# Patient Record
Sex: Female | Born: 1935
Health system: Southern US, Community
[De-identification: ages and names within clinical notes are randomized; demographics above are authoritative.]

## PROBLEM LIST (undated history)

## (undated) DIAGNOSIS — K760 Fatty (change of) liver, not elsewhere classified: Secondary | ICD-10-CM

## (undated) DIAGNOSIS — E785 Hyperlipidemia, unspecified: Secondary | ICD-10-CM

## (undated) DIAGNOSIS — I10 Essential (primary) hypertension: Secondary | ICD-10-CM

## (undated) DIAGNOSIS — K611 Rectal abscess: Secondary | ICD-10-CM

## (undated) DIAGNOSIS — E041 Nontoxic single thyroid nodule: Secondary | ICD-10-CM

## (undated) HISTORY — PX: ANAL FISSURE REPAIR: SHX2312

## (undated) HISTORY — DX: Rectal abscess: K61.1

## (undated) HISTORY — DX: Nontoxic single thyroid nodule: E04.1

## (undated) HISTORY — DX: Essential (primary) hypertension: I10

## (undated) HISTORY — PX: HEMORRHOID SURGERY: SHX153

## (undated) HISTORY — PX: TONSILLECTOMY AND ADENOIDECTOMY: SUR1326

## (undated) HISTORY — DX: Hyperlipidemia, unspecified: E78.5

## (undated) HISTORY — PX: CATARACT EXTRACTION, BILATERAL: SHX1313

## (undated) HISTORY — PX: ABDOMINAL HYSTERECTOMY: SHX81

## (undated) HISTORY — DX: Fatty (change of) liver, not elsewhere classified: K76.0

---

## 1998-03-25 ENCOUNTER — Other Ambulatory Visit: Admission: RE | Admit: 1998-03-25 | Discharge: 1998-03-25 | Payer: Self-pay | Admitting: Obstetrics and Gynecology

## 1999-06-13 ENCOUNTER — Encounter: Payer: Self-pay | Admitting: Obstetrics and Gynecology

## 1999-06-13 ENCOUNTER — Encounter: Admission: RE | Admit: 1999-06-13 | Discharge: 1999-06-13 | Payer: Self-pay | Admitting: Obstetrics and Gynecology

## 1999-06-26 ENCOUNTER — Other Ambulatory Visit: Admission: RE | Admit: 1999-06-26 | Discharge: 1999-06-26 | Payer: Self-pay | Admitting: Obstetrics and Gynecology

## 2000-06-21 ENCOUNTER — Encounter: Admission: RE | Admit: 2000-06-21 | Discharge: 2000-06-21 | Payer: Self-pay | Admitting: Obstetrics and Gynecology

## 2000-06-21 ENCOUNTER — Encounter: Payer: Self-pay | Admitting: Obstetrics and Gynecology

## 2000-07-11 ENCOUNTER — Other Ambulatory Visit: Admission: RE | Admit: 2000-07-11 | Discharge: 2000-07-11 | Payer: Self-pay | Admitting: Obstetrics and Gynecology

## 2001-03-17 DIAGNOSIS — Z9089 Acquired absence of other organs: Secondary | ICD-10-CM | POA: Insufficient documentation

## 2001-07-11 ENCOUNTER — Encounter: Payer: Self-pay | Admitting: Obstetrics and Gynecology

## 2001-07-11 ENCOUNTER — Encounter: Admission: RE | Admit: 2001-07-11 | Discharge: 2001-07-11 | Payer: Self-pay | Admitting: Obstetrics and Gynecology

## 2001-08-11 ENCOUNTER — Other Ambulatory Visit: Admission: RE | Admit: 2001-08-11 | Discharge: 2001-08-11 | Payer: Self-pay | Admitting: Obstetrics and Gynecology

## 2002-08-13 ENCOUNTER — Encounter: Payer: Self-pay | Admitting: Obstetrics and Gynecology

## 2002-08-13 ENCOUNTER — Encounter: Admission: RE | Admit: 2002-08-13 | Discharge: 2002-08-13 | Payer: Self-pay | Admitting: Obstetrics and Gynecology

## 2002-12-24 ENCOUNTER — Encounter: Admission: RE | Admit: 2002-12-24 | Discharge: 2002-12-24 | Payer: Self-pay | Admitting: Family Medicine

## 2002-12-24 ENCOUNTER — Encounter: Payer: Self-pay | Admitting: Family Medicine

## 2003-07-13 ENCOUNTER — Encounter: Admission: RE | Admit: 2003-07-13 | Discharge: 2003-10-11 | Payer: Self-pay | Admitting: Family Medicine

## 2003-09-14 ENCOUNTER — Encounter: Admission: RE | Admit: 2003-09-14 | Discharge: 2003-09-14 | Payer: Self-pay | Admitting: Obstetrics and Gynecology

## 2003-10-14 ENCOUNTER — Encounter: Admission: RE | Admit: 2003-10-14 | Discharge: 2004-01-12 | Payer: Self-pay | Admitting: Family Medicine

## 2004-03-08 ENCOUNTER — Ambulatory Visit: Payer: Self-pay | Admitting: Family Medicine

## 2004-03-17 ENCOUNTER — Ambulatory Visit: Payer: Self-pay | Admitting: Internal Medicine

## 2004-03-27 ENCOUNTER — Ambulatory Visit: Payer: Self-pay | Admitting: Gastroenterology

## 2004-04-11 ENCOUNTER — Ambulatory Visit: Payer: Self-pay | Admitting: Gastroenterology

## 2004-04-17 ENCOUNTER — Ambulatory Visit (HOSPITAL_COMMUNITY): Admission: RE | Admit: 2004-04-17 | Discharge: 2004-04-17 | Payer: Self-pay | Admitting: Gastroenterology

## 2004-04-17 ENCOUNTER — Ambulatory Visit: Payer: Self-pay | Admitting: Gastroenterology

## 2004-05-16 ENCOUNTER — Ambulatory Visit: Payer: Self-pay | Admitting: Gastroenterology

## 2004-05-17 ENCOUNTER — Ambulatory Visit: Payer: Self-pay | Admitting: Family Medicine

## 2004-06-26 ENCOUNTER — Ambulatory Visit: Payer: Self-pay | Admitting: Gastroenterology

## 2004-07-04 ENCOUNTER — Ambulatory Visit: Payer: Self-pay | Admitting: Family Medicine

## 2004-09-07 ENCOUNTER — Ambulatory Visit: Payer: Self-pay | Admitting: Family Medicine

## 2004-09-29 ENCOUNTER — Other Ambulatory Visit: Admission: RE | Admit: 2004-09-29 | Discharge: 2004-09-29 | Payer: Self-pay | Admitting: Obstetrics and Gynecology

## 2004-10-20 ENCOUNTER — Encounter (INDEPENDENT_AMBULATORY_CARE_PROVIDER_SITE_OTHER): Payer: Self-pay | Admitting: *Deleted

## 2004-10-21 ENCOUNTER — Inpatient Hospital Stay (HOSPITAL_COMMUNITY): Admission: RE | Admit: 2004-10-21 | Discharge: 2004-10-22 | Payer: Self-pay | Admitting: Obstetrics and Gynecology

## 2004-12-22 ENCOUNTER — Ambulatory Visit (HOSPITAL_COMMUNITY): Admission: RE | Admit: 2004-12-22 | Discharge: 2004-12-22 | Payer: Self-pay | Admitting: *Deleted

## 2004-12-22 ENCOUNTER — Encounter (INDEPENDENT_AMBULATORY_CARE_PROVIDER_SITE_OTHER): Payer: Self-pay | Admitting: *Deleted

## 2004-12-22 ENCOUNTER — Ambulatory Visit (HOSPITAL_BASED_OUTPATIENT_CLINIC_OR_DEPARTMENT_OTHER): Admission: RE | Admit: 2004-12-22 | Discharge: 2004-12-22 | Payer: Self-pay | Admitting: *Deleted

## 2004-12-29 ENCOUNTER — Observation Stay (HOSPITAL_COMMUNITY): Admission: RE | Admit: 2004-12-29 | Discharge: 2004-12-30 | Payer: Self-pay | Admitting: General Surgery

## 2005-02-02 ENCOUNTER — Ambulatory Visit: Payer: Self-pay | Admitting: Family Medicine

## 2005-02-16 ENCOUNTER — Ambulatory Visit: Payer: Self-pay | Admitting: Family Medicine

## 2005-05-22 ENCOUNTER — Ambulatory Visit: Payer: Self-pay | Admitting: Family Medicine

## 2005-05-24 ENCOUNTER — Ambulatory Visit: Payer: Self-pay | Admitting: Family Medicine

## 2005-06-07 ENCOUNTER — Ambulatory Visit: Payer: Self-pay | Admitting: Family Medicine

## 2005-10-25 ENCOUNTER — Ambulatory Visit: Payer: Self-pay | Admitting: Family Medicine

## 2005-11-26 ENCOUNTER — Encounter: Admission: RE | Admit: 2005-11-26 | Discharge: 2005-11-26 | Payer: Self-pay | Admitting: Gastroenterology

## 2005-11-27 ENCOUNTER — Ambulatory Visit: Payer: Self-pay | Admitting: Family Medicine

## 2005-12-13 ENCOUNTER — Ambulatory Visit: Payer: Self-pay | Admitting: Family Medicine

## 2005-12-27 ENCOUNTER — Encounter: Admission: RE | Admit: 2005-12-27 | Discharge: 2005-12-27 | Payer: Self-pay | Admitting: Gastroenterology

## 2006-01-15 ENCOUNTER — Ambulatory Visit: Payer: Self-pay | Admitting: Family Medicine

## 2006-01-22 ENCOUNTER — Ambulatory Visit: Payer: Self-pay | Admitting: Family Medicine

## 2006-04-23 ENCOUNTER — Ambulatory Visit: Payer: Self-pay | Admitting: Family Medicine

## 2006-05-16 ENCOUNTER — Encounter: Admission: RE | Admit: 2006-05-16 | Discharge: 2006-05-16 | Payer: Self-pay | Admitting: Family Medicine

## 2006-06-06 ENCOUNTER — Ambulatory Visit: Payer: Self-pay | Admitting: Family Medicine

## 2006-06-06 LAB — CONVERTED CEMR LAB
AST: 30 units/L (ref 0–37)
Albumin: 4.2 g/dL (ref 3.5–5.2)
Bilirubin, Direct: 0.2 mg/dL (ref 0.0–0.3)
Calcium: 9.6 mg/dL (ref 8.4–10.5)
Free T4: 0.7 ng/dL (ref 0.6–1.6)
GFR calc non Af Amer: 47 mL/min
Potassium: 3.7 meq/L (ref 3.5–5.1)
TSH: 2.01 microintl units/mL (ref 0.35–5.50)
Total Bilirubin: 0.8 mg/dL (ref 0.3–1.2)
Total Protein: 6.9 g/dL (ref 6.0–8.3)

## 2006-06-10 ENCOUNTER — Encounter: Payer: Self-pay | Admitting: Family Medicine

## 2006-07-05 ENCOUNTER — Ambulatory Visit: Payer: Self-pay | Admitting: Family Medicine

## 2006-09-19 ENCOUNTER — Ambulatory Visit: Payer: Self-pay | Admitting: Family Medicine

## 2006-09-19 LAB — CONVERTED CEMR LAB
ALT: 25 units/L (ref 0–40)
Albumin: 4.5 g/dL (ref 3.5–5.2)
Alkaline Phosphatase: 66 units/L (ref 39–117)
Bilirubin, Direct: 0.2 mg/dL (ref 0.0–0.3)
CO2: 29 meq/L (ref 19–32)
Calcium: 9.8 mg/dL (ref 8.4–10.5)
Chloride: 101 meq/L (ref 96–112)
GFR calc Af Amer: 71 mL/min
GFR calc non Af Amer: 58 mL/min
Glucose, Bld: 109 mg/dL — ABNORMAL HIGH (ref 70–99)
Hgb A1c MFr Bld: 6 % (ref 4.6–6.0)
Microalb Creat Ratio: 5.9 mg/g (ref 0.0–30.0)
Microalb, Ur: 0.2 mg/dL (ref 0.0–1.9)
Potassium: 3.6 meq/L (ref 3.5–5.1)
Sodium: 140 meq/L (ref 135–145)
Total Protein: 7.6 g/dL (ref 6.0–8.3)

## 2006-09-24 DIAGNOSIS — S90936A Unspecified superficial injury of unspecified lesser toe(s), initial encounter: Secondary | ICD-10-CM

## 2006-09-24 DIAGNOSIS — S90929A Unspecified superficial injury of unspecified foot, initial encounter: Secondary | ICD-10-CM | POA: Insufficient documentation

## 2006-09-24 DIAGNOSIS — Z9889 Other specified postprocedural states: Secondary | ICD-10-CM

## 2006-09-24 DIAGNOSIS — K612 Anorectal abscess: Secondary | ICD-10-CM | POA: Insufficient documentation

## 2006-09-24 DIAGNOSIS — R945 Abnormal results of liver function studies: Secondary | ICD-10-CM

## 2006-09-24 DIAGNOSIS — K7689 Other specified diseases of liver: Secondary | ICD-10-CM

## 2006-09-24 DIAGNOSIS — E118 Type 2 diabetes mellitus with unspecified complications: Secondary | ICD-10-CM | POA: Insufficient documentation

## 2006-09-24 DIAGNOSIS — I10 Essential (primary) hypertension: Secondary | ICD-10-CM | POA: Insufficient documentation

## 2006-11-01 ENCOUNTER — Ambulatory Visit: Payer: Self-pay | Admitting: Family Medicine

## 2006-11-01 DIAGNOSIS — Z8639 Personal history of other endocrine, nutritional and metabolic disease: Secondary | ICD-10-CM

## 2006-11-01 DIAGNOSIS — E785 Hyperlipidemia, unspecified: Secondary | ICD-10-CM | POA: Insufficient documentation

## 2006-11-01 DIAGNOSIS — Z862 Personal history of diseases of the blood and blood-forming organs and certain disorders involving the immune mechanism: Secondary | ICD-10-CM | POA: Insufficient documentation

## 2006-12-10 ENCOUNTER — Encounter: Admission: RE | Admit: 2006-12-10 | Discharge: 2006-12-10 | Payer: Self-pay | Admitting: Family Medicine

## 2006-12-11 ENCOUNTER — Telehealth (INDEPENDENT_AMBULATORY_CARE_PROVIDER_SITE_OTHER): Payer: Self-pay | Admitting: *Deleted

## 2007-04-21 ENCOUNTER — Ambulatory Visit: Payer: Self-pay | Admitting: Family Medicine

## 2007-05-12 ENCOUNTER — Encounter (INDEPENDENT_AMBULATORY_CARE_PROVIDER_SITE_OTHER): Payer: Self-pay | Admitting: *Deleted

## 2007-10-17 ENCOUNTER — Ambulatory Visit: Payer: Self-pay | Admitting: Family Medicine

## 2007-10-18 LAB — CONVERTED CEMR LAB
ALT: 21 units/L (ref 0–35)
Albumin: 4.1 g/dL (ref 3.5–5.2)
Alkaline Phosphatase: 61 units/L (ref 39–117)
BUN: 24 mg/dL — ABNORMAL HIGH (ref 6–23)
Bilirubin, Direct: 0.1 mg/dL (ref 0.0–0.3)
Creatinine, Ser: 1.1 mg/dL (ref 0.4–1.2)
GFR calc non Af Amer: 52 mL/min
Potassium: 3.8 meq/L (ref 3.5–5.1)
Sodium: 138 meq/L (ref 135–145)
Total Bilirubin: 1.1 mg/dL (ref 0.3–1.2)
Total Protein: 6.9 g/dL (ref 6.0–8.3)

## 2007-10-20 ENCOUNTER — Encounter (INDEPENDENT_AMBULATORY_CARE_PROVIDER_SITE_OTHER): Payer: Self-pay | Admitting: *Deleted

## 2007-10-21 ENCOUNTER — Ambulatory Visit: Payer: Self-pay | Admitting: Family Medicine

## 2008-02-03 ENCOUNTER — Ambulatory Visit: Payer: Self-pay | Admitting: Family Medicine

## 2008-02-16 ENCOUNTER — Encounter (INDEPENDENT_AMBULATORY_CARE_PROVIDER_SITE_OTHER): Payer: Self-pay | Admitting: *Deleted

## 2008-02-24 ENCOUNTER — Ambulatory Visit: Payer: Self-pay | Admitting: Family Medicine

## 2008-02-24 LAB — CONVERTED CEMR LAB: Hgb A1c MFr Bld: 5.8 % (ref 4.6–6.0)

## 2008-02-26 ENCOUNTER — Encounter (INDEPENDENT_AMBULATORY_CARE_PROVIDER_SITE_OTHER): Payer: Self-pay | Admitting: *Deleted

## 2008-05-04 ENCOUNTER — Telehealth (INDEPENDENT_AMBULATORY_CARE_PROVIDER_SITE_OTHER): Payer: Self-pay | Admitting: *Deleted

## 2008-09-09 ENCOUNTER — Ambulatory Visit: Payer: Self-pay | Admitting: Family Medicine

## 2008-09-10 ENCOUNTER — Encounter (INDEPENDENT_AMBULATORY_CARE_PROVIDER_SITE_OTHER): Payer: Self-pay | Admitting: *Deleted

## 2008-09-14 ENCOUNTER — Encounter: Payer: Self-pay | Admitting: Family Medicine

## 2008-09-27 ENCOUNTER — Encounter (INDEPENDENT_AMBULATORY_CARE_PROVIDER_SITE_OTHER): Payer: Self-pay | Admitting: *Deleted

## 2008-10-22 ENCOUNTER — Ambulatory Visit: Payer: Self-pay | Admitting: Family Medicine

## 2009-04-11 ENCOUNTER — Telehealth: Payer: Self-pay | Admitting: Family Medicine

## 2009-06-01 ENCOUNTER — Encounter: Payer: Self-pay | Admitting: Family Medicine

## 2009-06-10 ENCOUNTER — Ambulatory Visit: Payer: Self-pay | Admitting: Family Medicine

## 2009-06-13 ENCOUNTER — Telehealth (INDEPENDENT_AMBULATORY_CARE_PROVIDER_SITE_OTHER): Payer: Self-pay | Admitting: *Deleted

## 2009-06-14 ENCOUNTER — Encounter: Payer: Self-pay | Admitting: Family Medicine

## 2009-06-23 ENCOUNTER — Ambulatory Visit: Payer: Self-pay | Admitting: Family Medicine

## 2009-07-14 ENCOUNTER — Ambulatory Visit: Payer: Self-pay | Admitting: Family Medicine

## 2009-12-13 ENCOUNTER — Ambulatory Visit: Payer: Self-pay | Admitting: Family Medicine

## 2009-12-19 LAB — CONVERTED CEMR LAB
AST: 25 units/L (ref 0–37)
Alkaline Phosphatase: 61 units/L (ref 39–117)
Bilirubin, Direct: 0.1 mg/dL (ref 0.0–0.3)
Calcium: 9.4 mg/dL (ref 8.4–10.5)
Creatinine,U: 40 mg/dL
GFR calc non Af Amer: 45.04 mL/min (ref >60)
Hgb A1c MFr Bld: 5.9 % (ref 4.6–6.5)
LDL Cholesterol: 65 mg/dL (ref 0–99)
Microalb, Ur: 0.4 mg/dL (ref 0.0–1.9)
Potassium: 3.9 meq/L (ref 3.5–5.1)
Total CHOL/HDL Ratio: 2

## 2009-12-29 ENCOUNTER — Ambulatory Visit: Payer: Self-pay | Admitting: Family Medicine

## 2010-02-01 ENCOUNTER — Encounter: Payer: Self-pay | Admitting: Family Medicine

## 2010-02-01 ENCOUNTER — Encounter: Admission: RE | Admit: 2010-02-01 | Discharge: 2010-02-01 | Payer: Self-pay | Admitting: Gastroenterology

## 2010-02-21 ENCOUNTER — Telehealth: Payer: Self-pay | Admitting: Family Medicine

## 2010-02-28 ENCOUNTER — Encounter: Payer: Self-pay | Admitting: Family Medicine

## 2010-03-02 ENCOUNTER — Encounter: Payer: Self-pay | Admitting: Family Medicine

## 2010-03-06 ENCOUNTER — Encounter: Payer: Self-pay | Admitting: Family Medicine

## 2010-03-14 ENCOUNTER — Ambulatory Visit (HOSPITAL_COMMUNITY): Admission: RE | Admit: 2010-03-14 | Discharge: 2010-03-14 | Payer: Self-pay | Admitting: Gastroenterology

## 2010-04-17 ENCOUNTER — Encounter: Payer: Self-pay | Admitting: Family Medicine

## 2010-05-28 ENCOUNTER — Encounter: Payer: Self-pay | Admitting: Obstetrics and Gynecology

## 2010-06-02 LAB — CBC
HCT: 38.2 % (ref 36.0–46.0)
MCH: 24.9 pg — ABNORMAL LOW (ref 26.0–34.0)
Platelets: 242 10*3/uL (ref 150–400)
RBC: 4.94 MIL/uL (ref 3.87–5.11)
RDW: 14.7 % (ref 11.5–15.5)
WBC: 6 10*3/uL (ref 4.0–10.5)

## 2010-06-02 LAB — DIFFERENTIAL
Basophils Absolute: 0.1 10*3/uL (ref 0.0–0.1)
Basophils Relative: 1 % (ref 0–1)
Eosinophils Absolute: 0.1 10*3/uL (ref 0.0–0.7)
Monocytes Absolute: 0.4 10*3/uL (ref 0.1–1.0)

## 2010-06-02 LAB — COMPREHENSIVE METABOLIC PANEL
Albumin: 3.9 g/dL (ref 3.5–5.2)
BUN: 19 mg/dL (ref 6–23)
Calcium: 9.7 mg/dL (ref 8.4–10.5)
Chloride: 106 mEq/L (ref 96–112)
Glucose, Bld: 99 mg/dL (ref 70–99)
Potassium: 4.2 mEq/L (ref 3.5–5.1)
Total Protein: 7 g/dL (ref 6.0–8.3)

## 2010-06-04 LAB — CONVERTED CEMR LAB
ALT: 23 units/L (ref 0–35)
AST: 24 units/L (ref 0–37)
AST: 29 units/L (ref 0–37)
AST: 29 units/L (ref 0–37)
Albumin: 4.1 g/dL (ref 3.5–5.2)
Albumin: 4.1 g/dL (ref 3.5–5.2)
Albumin: 4.2 g/dL (ref 3.5–5.2)
Alkaline Phosphatase: 59 units/L (ref 39–117)
BUN: 21 mg/dL (ref 6–23)
BUN: 31 mg/dL — ABNORMAL HIGH (ref 6–23)
Basophils Relative: 0.1 % (ref 0.0–3.0)
Bilirubin, Direct: 0.2 mg/dL (ref 0.0–0.3)
Bilirubin, Direct: 0.2 mg/dL (ref 0.0–0.3)
CO2: 26 meq/L (ref 19–32)
Calcium: 9.3 mg/dL (ref 8.4–10.5)
Calcium: 9.4 mg/dL (ref 8.4–10.5)
Calcium: 9.9 mg/dL (ref 8.4–10.5)
Chloride: 101 meq/L (ref 96–112)
Creatinine, Ser: 1 mg/dL (ref 0.4–1.2)
Creatinine, Ser: 1.1 mg/dL (ref 0.4–1.2)
Creatinine,U: 26.8 mg/dL
Creatinine,U: 29.6 mg/dL
Eosinophils Relative: 3.5 % (ref 0.0–5.0)
GFR calc Af Amer: 70 mL/min
GFR calc non Af Amer: 51.9 mL/min (ref >60)
Glucose, Bld: 107 mg/dL — ABNORMAL HIGH (ref 70–99)
Glucose, Bld: 98 mg/dL (ref 70–99)
Hgb A1c MFr Bld: 5.9 % (ref 4.6–6.5)
Hgb A1c MFr Bld: 6 % (ref 4.6–6.5)
MCHC: 34.6 g/dL (ref 30.0–36.0)
Microalb Creat Ratio: 6.8 mg/g (ref 0.0–30.0)
Microalb, Ur: 0.2 mg/dL (ref 0.0–1.9)
Microalb, Ur: 0.5 mg/dL (ref 0.0–1.9)
Monocytes Absolute: 0.5 10*3/uL (ref 0.1–1.0)
Neutrophils Relative %: 47.4 % (ref 43.0–77.0)
Pap Smear: NORMAL
Pap Smear: NORMAL
Potassium: 3.7 meq/L (ref 3.5–5.1)
Potassium: 3.8 meq/L (ref 3.5–5.1)
RBC: 5.07 M/uL (ref 3.87–5.11)
TSH: 3.46 microintl units/mL (ref 0.35–5.50)
Total Bilirubin: 0.9 mg/dL (ref 0.3–1.2)
Total Bilirubin: 1.1 mg/dL (ref 0.3–1.2)
Total Protein: 6.8 g/dL (ref 6.0–8.3)
Total Protein: 7 g/dL (ref 6.0–8.3)
Total Protein: 7.2 g/dL (ref 6.0–8.3)
WBC: 5 10*3/uL (ref 4.5–10.5)

## 2010-06-06 NOTE — Procedures (Signed)
Summary: EGD/[facility]  EGD/[facility]   Imported By: Lanelle Bal 03/09/2010 11:26:08  _____________________________________________________________________  External Attachment:    Type:   Image     Comment:   External Document

## 2010-06-06 NOTE — Assessment & Plan Note (Signed)
Summary: DISCUSS LABS/[facility]   Vital Signs:  Patient profile:   76 year old female Weight:      151 pounds Temp:     97.8 degrees F oral Pulse rate:   74 / minute Pulse rhythm:   regular BP sitting:   126 / 84  (left arm) Cuff size:   regular  Vitals Entered By: Army Fossa CMA (June 23, 2009 9:37 AM) CC: Discuss labs abd BP meds   History of Present Illness:  Hyperlipidemia follow-up      This is a 76 year old woman who presents for Hyperlipidemia follow-up.  The patient denies muscle aches, GI upset, abdominal pain, flushing, itching, constipation, diarrhea, and fatigue.  The patient denies the following symptoms: chest pain/pressure, exercise intolerance, dypsnea, palpitations, syncope, and pedal edema.  Compliance with medications (by patient report) has been near 100%.  Dietary compliance has been good.  The patient reports exercising daily.  Adjunctive measures currently used by the patient include ASA and fish oil supplements.    Hypertension follow-up      The patient also presents for Hypertension follow-up.  The patient denies lightheadedness, urinary frequency, headaches, edema, impotence, rash, and fatigue.  The patient denies the following associated symptoms: chest pain, chest pressure, exercise intolerance, dyspnea, palpitations, syncope, leg edema, and pedal edema.  Compliance with medications (by patient report) has been near 100%.  The patient reports that dietary compliance has been good.  The patient reports exercising daily.  Adjunctive measures currently used by the patient include salt restriction.    Type 1 diabetes mellitus follow-up      The patient is also here for Type 2 diabetes mellitus follow-up.  The patient denies polyuria, polydipsia, blurred vision, self managed hypoglycemia, hypoglycemia requiring help, weight loss, weight gain, and numbness of extremities.  The patient denies the following symptoms: neuropathic pain, chest pain, vomiting,  orthostatic symptoms, poor wound healing, intermittent claudication, vision loss, and foot ulcer.  Since the last visit the patient reports good dietary compliance, compliance with medications, exercising regularly, and monitoring blood glucose.  The patient has been measuring capillary blood glucose before dinner.  Since the last visit, the patient reports having had eye care by an ophthalmologist and no foot care.    Preventive Screening-Counseling & Management  Alcohol-Tobacco     Alcohol drinks/day: 0     Smoking Status: never     Passive Smoke Exposure: no  Caffeine-Diet-Exercise     Caffeine use/day: 1     Does Patient Exercise: yes     Type of exercise: walking     Times/week: 5  Current Medications (verified): 1)  Maxzide 75-50 Mg Tabs (Triamterene-Hctz) .... Take 1 Tablet By Mouth Once A Day 2)  Simvastatin 40 Mg Tabs (Simvastatin) .Marland Kitchen.. 1 By Mouth Once Daily. Needs Office Visit and Labwork. 3)  Klor-Con M20 20 Meq Tbcr (Potassium Chloride Crys Cr) .... Take 1 Tablet By Mouth Three Times A Day 4)  Lisinopril 20 Mg Tabs (Lisinopril) .Marland Kitchen.. 1 By Mouth Once Daily 5)  Tenormin 50 Mg Tabs (Atenolol) .... Take One Tablet Daily 6)  Baby Aspirin 81 Mg  Chew (Aspirin) 7)  Freestyle Lancets   Misc (Lancets) .... Accu Checks Bid 8)  Freestyle Test   Strp (Glucose Blood) .... Accu Check Two Times A Day 9)  Valium 5 Mg  Tabs (Diazepam) .... 1/2 By Mouth Once Daily  Allergies: 1)  ! Sulfa  Past History:  Past medical, surgical, family and social histories (including  risk factors) reviewed for relevance to current acute and chronic problems.  Past Medical History: Reviewed history from 10/21/2007 and no changes required. Diabetes mellitus, type II Hypertension Hyperlipidemia Current Problems:  HYPERLIPIDEMIA (ICD-272.4) THYROID NODULE, HX OF (ICD-V12.2) HEMORRHOIDECTOMY, HX OF (ICD-V45.89) ABSCESS, RECTUM (ICD-566) FOOT INJURY (ICD-917.8) TONSILLECTOMY AND ADENOIDECTOMY, HX OF  (ICD-V45.79) FATTY LIVER DISEASE (ICD-571.8) LIVER FUNCTION TESTS, ABNORMAL (ICD-794.8) HYPERTENSION (ICD-401.9) DIABETES MELLITUS, TYPE II (ICD-250.00)  Past Surgical History: Reviewed history from 09/24/2006 and no changes required. Hysterectomy-VAGINAL  Family History: Reviewed history from 10/21/2007 and no changes required. Family History Diabetes 1st degree relative Family History Hypertension  Social History: Reviewed history from 10/21/2007 and no changes required. Retired--teacher Married Never Smoked Alcohol use-no Drug use-no Regular exercise-yes  Review of Systems      See HPI  Physical Exam  General:  Well-developed,well-nourished,in no acute distress; alert,appropriate and cooperative throughout examination Neck:  No deformities, masses, or tenderness noted. Lungs:  Normal respiratory effort, chest expands symmetrically. Lungs are clear to auscultation, no crackles or wheezes. Heart:  normal rate and no murmur.   Extremities:  No clubbing, cyanosis, edema, or deformity noted with normal full range of motion of all joints.   Skin:  Intact without suspicious lesions or rashes Psych:  Cognition and judgment appear intact. Alert and cooperative with normal attention span and concentration. No apparent delusions, illusions, hallucinations  Diabetes Management Exam:    Foot Exam (with socks and/or shoes not present):       Sensory-Pinprick/Light touch:          Left medial foot (L-4): normal          Left dorsal foot (L-5): normal          Left lateral foot (S-1): normal          Right medial foot (L-4): normal          Right dorsal foot (L-5): normal          Right lateral foot (S-1): normal       Sensory-Monofilament:          Left foot: normal          Right foot: normal       Inspection:          Left foot: normal          Right foot: normal       Nails:          Left foot: normal          Right foot: normal    Eye Exam:       Eye Exam done  elsewhere          Date: 05/18/2009          Results: normal          Done by: McFarland   Impression & Recommendations:  Problem # 1:  HYPERLIPIDEMIA (ICD-272.4)  Her updated medication list for this problem includes:    Simvastatin 40 Mg Tabs (Simvastatin) .Marland Kitchen... 1 by mouth once daily. needs office visit and labwork.  Labs Reviewed: SGOT: 24 (06/10/2009)   SGPT: 23 (06/10/2009)  Problem # 2:  HYPERTENSION (ICD-401.9)  The following medications were removed from the medication list:    Maxzide 75-50 Mg Tabs (Triamterene-hctz) Her updated medication list for this problem includes:    Maxzide 75-50 Mg Tabs (Triamterene-hctz) .Marland Kitchen... Take 1 tablet by mouth once a day    Lisinopril 20 Mg Tabs (Lisinopril) .Marland Kitchen... 1 by mouth once daily  Tenormin 50 Mg Tabs (Atenolol) .Marland Kitchen... Take one tablet daily  BP today: 126/84 Prior BP: 130/80 (10/22/2008)  Labs Reviewed: K+: 3.7 (09/09/2008) Creat: : 1.1 (09/09/2008)     Problem # 3:  DIABETES MELLITUS, TYPE II (ICD-250.00)  Her updated medication list for this problem includes:    Lisinopril 20 Mg Tabs (Lisinopril) .Marland Kitchen... 1 by mouth once daily    Baby Aspirin 81 Mg Chew (Aspirin)  Labs Reviewed: Creat: 1.1 (09/09/2008)     Last Eye Exam: normal (05/18/2009) Reviewed HgBA1c results: 6.0 (06/10/2009)  5.9 (09/09/2008)  Complete Medication List: 1)  Maxzide 75-50 Mg Tabs (Triamterene-hctz) .... Take 1 tablet by mouth once a day 2)  Simvastatin 40 Mg Tabs (Simvastatin) .Marland Kitchen.. 1 by mouth once daily. needs office visit and labwork. 3)  Klor-con M20 20 Meq Tbcr (Potassium chloride crys cr) .... Take 1 tablet by mouth three times a day 4)  Lisinopril 20 Mg Tabs (Lisinopril) .Marland Kitchen.. 1 by mouth once daily 5)  Tenormin 50 Mg Tabs (Atenolol) .... Take one tablet daily 6)  Baby Aspirin 81 Mg Chew (Aspirin) 7)  Freestyle Lancets Misc (Lancets) .... Accu checks bid 8)  Freestyle Test Strp (Glucose blood) .... Accu check two times a day 9)  Valium 5 Mg  Tabs (Diazepam) .... 1/2 by mouth once daily  Patient Instructions: 1)  rto 2 weeks nurse visit for bp check Prescriptions: TENORMIN 50 MG TABS (ATENOLOL) TAKE ONE TABLET DAILY  #90 x 3   Entered and Authorized by:   Loreen Freud DO   Signed by:   Loreen Freud DO on 06/23/2009   Method used:   Electronically to        [facility] [phone]* (retail)       [address]       [place], [place]  [address]       Ph: [phone]       Fax: [phone]   RxID:   [id number] KLOR-CON M20 20 MEQ TBCR (POTASSIUM CHLORIDE CRYS CR) Take 1 tablet by mouth three times a day  #270 x 3   Entered and Authorized by:   Loreen Freud DO   Signed by:   Loreen Freud DO on 06/23/2009   Method used:   Electronically to        [facility] [phone]* (retail)       [address]       [place], [place]  [address]       Ph: [phone]       Fax: [phone]   RxID:   [id number] MAXZIDE 75-50 MG TABS (TRIAMTERENE-HCTZ) Take 1 tablet by mouth once a day  #90 x 3   Entered and Authorized by:   Loreen Freud DO   Signed by:   Loreen Freud DO on 06/23/2009   Method used:   Electronically to        [facility] [phone]* (retail)       [address]       [place], [place]  [address]       Ph: [phone]       Fax: [phone]   RxID:   [id number] SIMVASTATIN 40 MG TABS (SIMVASTATIN) 1 by mouth once daily. NEEDS OFFICE VISIT AND LABWORK.  #90 x 3   Entered and Authorized by:   Loreen Freud DO  Signed by:   Loreen Freud DO on 06/23/2009   Method used:   Electronically to        [facility] [phone]* (retail)       [address]       [place], [place]  [address]       Ph: [phone]       Fax: [phone]   RxID:   [id number] LISINOPRIL 20 MG TABS (LISINOPRIL) 1 by mouth once daily  #30 x 1   Entered and Authorized by:   Loreen Freud DO   Signed by:   Loreen Freud DO on  06/23/2009   Method used:   Electronically to        [facility] [phone]* (retail)       [address]       [place], [place]  [address]       Ph: [phone]       Fax: [phone]   RxID:   [id number] VALIUM 5 MG  TABS (DIAZEPAM) 1/2 by mouth once daily  #30 Tablet x 2   Entered and Authorized by:   Loreen Freud DO   Signed by:   Loreen Freud DO on 06/23/2009   Method used:   Print then Give to Patient   RxID:   [id number]

## 2010-06-06 NOTE — Assessment & Plan Note (Signed)
Summary: bp check/[facility]   Vital Signs:  Patient profile:   76 year old female Weight:      152.38 pounds Pulse rate:   76 / minute Pulse rhythm:   regular BP sitting:   130 / 82  (left arm) Cuff size:   regular  Vitals Entered By: Army Fossa CMA (July 14, 2009 10:28 AM) CC: Pt here for BP check.    History of Present Illness: Pt here for bp check only.  No complaints.    Allergies: 1)  ! Sulfa  Physical Exam  General:  Well-developed,well-nourished,in no acute distress; alert,appropriate and cooperative throughout examination Lungs:  Normal respiratory effort, chest expands symmetrically. Lungs are clear to auscultation, no crackles or wheezes. Heart:  normal rate and no murmur.   Psych:  Oriented X3 and normally interactive.     Impression & Recommendations:  Problem # 1:  HYPERTENSION (ICD-401.9)  Her updated medication list for this problem includes:    Maxzide 75-50 Mg Tabs (Triamterene-hctz) .Marland Kitchen... Take 1 tablet by mouth once a day    Lisinopril 20 Mg Tabs (Lisinopril) .Marland Kitchen... 1 by mouth once daily    Tenormin 50 Mg Tabs (Atenolol) .Marland Kitchen... Take one tablet daily  BP today: 130/82 Prior BP: 126/84 (06/23/2009)  Labs Reviewed: K+: 3.7 (09/09/2008) Creat: : 1.1 (09/09/2008)     Complete Medication List: 1)  Maxzide 75-50 Mg Tabs (Triamterene-hctz) .... Take 1 tablet by mouth once a day 2)  Simvastatin 40 Mg Tabs (Simvastatin) .Marland Kitchen.. 1 by mouth once daily. needs office visit and labwork. 3)  Klor-con M20 20 Meq Tbcr (Potassium chloride crys cr) .... Take 1 tablet by mouth three times a day 4)  Lisinopril 20 Mg Tabs (Lisinopril) .Marland Kitchen.. 1 by mouth once daily 5)  Tenormin 50 Mg Tabs (Atenolol) .... Take one tablet daily 6)  Baby Aspirin 81 Mg Chew (Aspirin) 7)  Freestyle Lancets Misc (Lancets) .... Accu checks bid 8)  Freestyle Test Strp (Glucose blood) .... Accu check two times a day 9)  Valium 5 Mg Tabs (Diazepam) .... 1/2 by mouth once daily  Patient  Instructions: 1)  rto august or sooner prn

## 2010-06-06 NOTE — Procedures (Signed)
Summary: pH Study/[facility]  pH Study/[facility]   Imported By: Lanelle Bal 03/14/2010 11:13:33  _____________________________________________________________________  External Attachment:    Type:   Image     Comment:   External Document

## 2010-06-06 NOTE — Assessment & Plan Note (Signed)
Summary: cpx/kn   Vital Signs:  Patient profile:   76 year old female Height:      61 inches Weight:      152 pounds BMI:     28.82 Temp:     98.5 degrees F oral Pulse rate:   56 / minute Pulse rhythm:   regular BP sitting:   140 / 90  (right arm)  Vitals Entered By: Almeta Monas CMA Duncan Dull) (December 29, 2009 10:12 AM) CC: cpe  Does patient need assistance? Functional Status Self care, Cook/clean, Shopping, Social activities Ambulation Normal Comments pt can read and write and is able to do all adls  Vision Screening:      Vision Comments:  optho q 1y  + cataracts and reading glasses 40db HL: Left  Right  Audiometry Comment: hearing grossly normal    History of Present Illness: Pt here for cpe,  no pap-- she sees gyn in sept.  Mammo and bmd scheduled already as well.     Type 1 diabetes mellitus follow-up      This is a 76 year old woman who presents with Type 2 diabetes mellitus follow-up.  The patient denies polyuria, polydipsia, blurred vision, self managed hypoglycemia, hypoglycemia requiring help, weight loss, weight gain, and numbness of extremities.  The patient denies the following symptoms: neuropathic pain, chest pain, vomiting, orthostatic symptoms, poor wound healing, intermittent claudication, vision loss, and foot ulcer.  Since the last visit the patient reports good dietary compliance, compliance with medications, exercising regularly, and monitoring blood glucose.  The patient has been measuring capillary blood glucose before breakfast.  Since the last visit, the patient reports having had eye care by an ophthalmologist and no foot care.    Hyperlipidemia follow-up      The patient also presents for Hyperlipidemia follow-up.  The patient denies muscle aches, GI upset, abdominal pain, flushing, itching, constipation, diarrhea, and fatigue.  The patient denies the following symptoms: chest pain/pressure, exercise intolerance, dypsnea, palpitations, syncope, and  pedal edema.  Compliance with medications (by patient report) has been near 100%.  Dietary compliance has been good.  The patient reports exercising daily.  Adjunctive measures currently used by the patient include ASA.    Hypertension follow-up      The patient also presents for Hypertension follow-up.  The patient denies lightheadedness, urinary frequency, headaches, edema, impotence, rash, and fatigue.  The patient denies the following associated symptoms: chest pain, chest pressure, exercise intolerance, dyspnea, palpitations, syncope, leg edema, and pedal edema.  Compliance with medications (by patient report) has been near 100%.  The patient reports that dietary compliance has been good.  The patient reports exercising daily.  Adjunctive measures currently used by the patient include salt restriction.    Preventive Screening-Counseling & Management  Alcohol-Tobacco     Alcohol drinks/day: 0     Smoking Status: never     Passive Smoke Exposure: no  Caffeine-Diet-Exercise     Caffeine use/day: 1     Does Patient Exercise: yes     Type of exercise: walking     Times/week: 7  Hep-HIV-STD-Contraception     HIV Risk: no     Dental Visit-last 6 months yes     Dental Care Counseling: not indicated; dental care within six months     SBE monthly: yes     SBE Education/Counseling: not indicated; SBE done regularly  Safety-Violence-Falls     Seat Belt Use: 100     Firearms in the Home: no firearms  in the home     Smoke Detectors: yes     Violence in the Home: no risk noted     Sexual Abuse: no     Fall Risk: no     Fall Risk Counseling: not indicated; no significant falls noted  Current Medications (verified): 1)  Maxzide 75-50 Mg Tabs (Triamterene-Hctz) .... Take 1 Tablet By Mouth Once A Day 2)  Simvastatin 40 Mg Tabs (Simvastatin) .Marland Kitchen.. 1 By Mouth Once Daily. Needs Office Visit and Labwork. 3)  Klor-Con M20 20 Meq Tbcr (Potassium Chloride Crys Cr) .... Take 1 Tablet By Mouth Every  Day 4)  Lisinopril 20 Mg Tabs (Lisinopril) .Marland Kitchen.. 1 By Mouth Once Daily 5)  Tenormin 50 Mg Tabs (Atenolol) .... Take One Tablet Daily 6)  Baby Aspirin 81 Mg  Chew (Aspirin) 7)  Freestyle Lancets   Misc (Lancets) .... Accu Checks Bid 8)  Freestyle Test   Strp (Glucose Blood) .... Accu Check Two Times A Day 9)  Valium 5 Mg  Tabs (Diazepam) .... 1/2 By Mouth Once Daily  Allergies (verified): 1)  ! Sulfa  Past History:  Past Medical History: Last updated: 10/21/2007 Diabetes mellitus, type II Hypertension Hyperlipidemia Current Problems:  HYPERLIPIDEMIA (ICD-272.4) THYROID NODULE, HX OF (ICD-V12.2) HEMORRHOIDECTOMY, HX OF (ICD-V45.89) ABSCESS, RECTUM (ICD-566) FOOT INJURY (ICD-917.8) TONSILLECTOMY AND ADENOIDECTOMY, HX OF (ICD-V45.79) FATTY LIVER DISEASE (ICD-571.8) LIVER FUNCTION TESTS, ABNORMAL (ICD-794.8) HYPERTENSION (ICD-401.9) DIABETES MELLITUS, TYPE II (ICD-250.00)  Past Surgical History: Last updated: 09/24/2006 Hysterectomy-VAGINAL  Family History: Last updated: 12/29/2009 Family History Diabetes 1st degree relative Family History Hypertension brother---rhabdo secondary to statin  Social History: Last updated: 10/21/2007 Retired--teacher Married Never Smoked Alcohol use-no Drug use-no Regular exercise-yes  Risk Factors: Alcohol Use: 0 (12/29/2009) Caffeine Use: 1 (12/29/2009) Exercise: yes (12/29/2009)  Risk Factors: Smoking Status: never (12/29/2009) Passive Smoke Exposure: no (12/29/2009)  Family History: Reviewed history from 10/21/2007 and no changes required. Family History Diabetes 1st degree relative Family History Hypertension brother---rhabdo secondary to statin  Social History: Reviewed history from 10/21/2007 and no changes required. 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 Eyes:  Denies blurring, discharge, double vision, eye irritation, eye pain, halos, itching, light sensitivity, red eye, vision loss-1 eye, and vision loss-both eyes. ENT:  Denies decreased hearing, difficulty swallowing, ear discharge, earache, hoarseness, nasal congestion, nosebleeds, postnasal drainage, ringing in ears, sinus pressure, and sore throat. CV:  Denies bluish discoloration of lips or nails, chest pain or discomfort, difficulty breathing at night, difficulty breathing while lying down, fainting, fatigue, leg cramps with exertion, lightheadness, near fainting, palpitations, shortness of breath with exertion, swelling of feet, swelling of hands, and weight gain. Resp:  Denies chest discomfort, chest pain with inspiration, cough, coughing up blood, excessive snoring, hypersomnolence, morning headaches, pleuritic, shortness of breath, sputum productive, and wheezing. GI:  Denies abdominal pain, bloody stools, change in bowel habits, constipation, dark tarry stools, diarrhea, excessive appetite, gas, hemorrhoids, indigestion, loss of appetite, nausea, vomiting, vomiting blood, and yellowish skin color. GU:  Denies abnormal vaginal bleeding, decreased libido, discharge, dysuria, genital sores, hematuria, incontinence, nocturia, urinary frequency, and urinary hesitancy. MS:  Denies joint pain, joint redness, joint swelling, loss of strength, low back pain, mid back pain, muscle aches, muscle , cramps, muscle weakness, stiffness, and  thoracic pain. Derm:  Denies changes in color of skin, changes in nail beds, dryness, excessive perspiration, flushing, hair loss, insect bite(s), itching, lesion(s), poor wound healing, and rash. Neuro:  Denies brief paralysis, difficulty with concentration, disturbances in coordination, falling down, headaches, inability to speak, memory loss, numbness, poor balance, seizures, sensation  of room spinning, tingling, tremors, visual disturbances, and weakness. Psych:  Denies alternate hallucination ( auditory/visual), anxiety, depression, easily angered, easily tearful, irritability, mental problems, panic attacks, sense of great danger, suicidal thoughts/plans, thoughts of violence, unusual visions or sounds, and thoughts /plans of harming others. Endo:  Denies cold intolerance, excessive hunger, excessive thirst, excessive urination, heat intolerance, polyuria, and weight change. Heme:  Denies abnormal bruising, bleeding, enlarge lymph nodes, fevers, pallor, and skin discoloration. Allergy:  Denies hives or rash, itching eyes, persistent infections, seasonal allergies, and sneezing.  Physical Exam  General:  Well-developed,well-nourished,in no acute distress; alert,appropriate and cooperative throughout examination Head:  Normocephalic and atraumatic without obvious abnormalities. No apparent alopecia or balding. Eyes:  vision grossly intact, pupils equal, pupils round, pupils reactive to light, and no injection.   Ears:  External ear exam shows no significant lesions or deformities.  Otoscopic examination reveals clear canals, tympanic membranes are intact bilaterally without bulging, retraction, inflammation or discharge. Hearing is grossly normal bilaterally. Nose:  External nasal examination shows no deformity or inflammation. Nasal mucosa are pink and moist without lesions or exudates. Mouth:  Oral mucosa and oropharynx without lesions or exudates.  Teeth in good repair. Neck:  No deformities, masses, or tenderness noted. Chest Wall:  No deformities, masses, or tenderness noted. Breasts:  gyn Lungs:  Normal respiratory effort, chest expands symmetrically. Lungs are clear to auscultation, no crackles or wheezes. Heart:  normal rate and no murmur.   Abdomen:  Bowel sounds positive,abdomen soft and non-tender without masses, organomegaly or hernias noted. Rectal:  No external  abnormalities noted. Normal sphincter tone. No rectal masses or tenderness. Genitalia:  gyn Msk:  normal ROM, no joint tenderness, no joint swelling, no joint warmth, no redness over joints, no joint deformities, no joint instability, and no crepitation.   Pulses:  R posterior tibial normal, R dorsalis pedis normal, R carotid normal, L posterior tibial normal, L dorsalis pedis normal, and L carotid normal.   Extremities:  No clubbing, cyanosis, edema, or deformity noted with normal full range of motion of all joints.   Neurologic:  No cranial nerve deficits noted. Station and gait are normal. Plantar reflexes are down-going bilaterally. DTRs are symmetrical throughout. Sensory, motor and coordinative functions appear intact. Skin:  Intact without suspicious lesions or rashes Cervical Nodes:  No lymphadenopathy noted Axillary Nodes:  No palpable lymphadenopathy Psych:  Cognition and judgment appear intact. Alert and cooperative with normal attention span and concentration. No apparent delusions, illusions, hallucinations   Impression & Recommendations:  Problem # 1:  PREVENTIVE HEALTH CARE (ICD-V70.0) labs reviewed Orders: Medicare -1st Annual Wellness Visit [phone])  Problem # 2:  HYPERLIPIDEMIA (ICD-272.4)  Her updated medication list for this problem includes:    Simvastatin 40 Mg Tabs (Simvastatin) .Marland Kitchen... 1 by mouth once daily. needs office visit and labwork.  Orders: Venipuncture ([id number])  Labs Reviewed: SGOT: 25 (12/13/2009)   SGPT: 17 (12/13/2009)   HDL:60.00 (12/13/2009)  LDL:65 (12/13/2009)  Chol:141 (12/13/2009)  Trig:81.0 (12/13/2009)  Problem # 3:  THYROID NODULE, HX OF (ICD-V12.2)  Problem # 4:  HYPERTENSION (ICD-401.9)  Her updated medication list for this problem includes:    Maxzide 75-50 Mg Tabs (  Triamterene-hctz) .Marland Kitchen... Take 1 tablet by mouth once a day    Lisinopril 20 Mg Tabs (Lisinopril) .Marland Kitchen... 1 by mouth once daily    Tenormin 50 Mg Tabs (Atenolol) .Marland Kitchen... Take one  tablet daily  BP today: 140/90 Prior BP: 130/82 (07/14/2009)  Labs Reviewed: K+: 3.9 (12/13/2009) Creat: : 1.2 (12/13/2009)   Chol: 141 (12/13/2009)   HDL: 60.00 (12/13/2009)   LDL: 65 (12/13/2009)   TG: 81.0 (12/13/2009)  Problem # 5:  DIABETES MELLITUS, TYPE II (ICD-250.00)  Her updated medication list for this problem includes:    Lisinopril 20 Mg Tabs (Lisinopril) .Marland Kitchen... 1 by mouth once daily    Baby Aspirin 81 Mg Chew (Aspirin)  Labs Reviewed: Creat: 1.2 (12/13/2009)     Last Eye Exam: normal (05/18/2009) Reviewed HgBA1c results: 5.9 (12/13/2009)  6.0 (06/10/2009)  Complete Medication List: 1)  Maxzide 75-50 Mg Tabs (Triamterene-hctz) .... Take 1 tablet by mouth once a day 2)  Simvastatin 40 Mg Tabs (Simvastatin) .Marland Kitchen.. 1 by mouth once daily. needs office visit and labwork. 3)  Klor-con M20 20 Meq Tbcr (Potassium chloride crys cr) .... Take 1 tablet by mouth every day 4)  Lisinopril 20 Mg Tabs (Lisinopril) .Marland Kitchen.. 1 by mouth once daily 5)  Tenormin 50 Mg Tabs (Atenolol) .... Take one tablet daily 6)  Baby Aspirin 81 Mg Chew (Aspirin) 7)  Freestyle Lancets Misc (Lancets) .... Accu checks bid 8)  Freestyle Test Strp (Glucose blood) .... Accu check two times a day 9)  Valium 5 Mg Tabs (Diazepam) .... 1/2 by mouth once daily  Patient Instructions: 1)  Take calcium +vitamin D daily. --  1200- 1500 mg calcium a day  with 1000u vita D3 Prescriptions: FREESTYLE TEST   STRP (GLUCOSE BLOOD) accu check two times a day  #1 month x 11   Entered and Authorized by:   Loreen Freud DO   Signed by:   Loreen Freud DO on 12/29/2009   Method used:   Electronically to        [id number]* (retail)       [address]       [place], [place]  [address]       Ph: [phone]       Fax: [phone]   RxID:   [id number] FREESTYLE LANCETS   MISC (LANCETS) Accu checks bid  #1 month x 11   Entered and Authorized by:   Loreen Freud DO   Signed  by:   Loreen Freud DO on 12/29/2009   Method used:   Electronically to        [facility] #[id number]* (retail)       [address]       [place], [place]  [address]       Ph: [phone]       Fax: [phone]   RxID:   [id number]   Herpes Zoster Next Due:  Refused      EKG  Procedure date:  12/29/2009  Findings:      Sinus bradycardia with rate of:  57

## 2010-06-06 NOTE — Progress Notes (Signed)
Summary: Refill Request  Phone Note Refill Request Call back at [phone] Message from:  Pharmacy on February 21, 2010 11:44 AM  Refills Requested: Medication #1:  LISINOPRIL 20 MG TABS 1 by mouth once daily   Dosage confirmed as above?Dosage Confirmed   Supply Requested: 1 month   Last Refilled: 08/24/2009  Medication #2:  VALIUM 5 MG  TABS 1/2 by mouth once daily.   Dosage confirmed as above?Dosage Confirmed   Brand Name Necessary? No   Supply Requested: 1 month   Last Refilled: 06/23/2009 [facility] [address]  Next Appointment Scheduled: 2.14.11 (lab) Initial call taken by: Harold Barban,  February 21, 2010 11:44 AM  Follow-up for Phone Call        Last OV 12/29/09 cpx done and last filled on 06/21/09...Marland KitchenMarland KitchenPlease advise on the Valium Follow-up by: Almeta Monas CMA Duncan Dull),  February 21, 2010 1:11 PM  Additional Follow-up for Phone Call Additional follow up Details #1::        ok to refill x1 Additional Follow-up by: Loreen Freud DO,  February 21, 2010 1:31 PM    Prescriptions: VALIUM 5 MG  TABS (DIAZEPAM) 1/2 by mouth once daily  #30 Tablet x 0   Entered by:   Almeta Monas CMA (AAMA)   Authorized by:   Loreen Freud DO   Signed by:   Almeta Monas CMA (AAMA) on 02/21/2010   Method used:   Printed then faxed to ...       [facility]  [address] [phone]* (retail)       [address]       [place], [place]  [address]       Ph: [phone]       Fax: [phone]   RxID:   [id number] LISINOPRIL 20 MG TABS (LISINOPRIL) 1 by mouth once daily  #30 Not Speci x 4   Entered by:   Almeta Monas CMA (AAMA)   Authorized by:   Loreen Freud DO   Signed by:   Almeta Monas CMA (AAMA) on 02/21/2010   Method used:   Electronically to        [facility] [phone]* (retail)       [address]       [place], [place]  [address]       Ph: [phone]       Fax: [phone]   RxID:   [id number]

## 2010-06-06 NOTE — Medication Information (Signed)
Summary: Formulary Letter Regarding Benicar/[employer]  Formulary Letter Regarding Benicar/[employer]   Imported By: Lanelle Bal 06/17/2009 09:51:14  _____________________________________________________________________  External Attachment:    Type:   Image     Comment:   External Document

## 2010-06-06 NOTE — Letter (Signed)
Summary: [facility]   Imported By: Lanelle Bal 03/14/2010 13:52:53  _____________________________________________________________________  External Attachment:    Type:   Image     Comment:   External Document

## 2010-06-06 NOTE — Progress Notes (Signed)
  Phone Note Outgoing Call   Summary of Call: Pt needs to bring formulary so we can see what medications they will cover. Benicar they do not cover. Left message with pts husband. Army Fossa CMA  June 13, 2009 5:00 PM      Appended Document:  Pt is aware.

## 2010-06-08 NOTE — Letter (Signed)
Summary: [facility]   Imported By: Lanelle Bal 05/02/2010 08:36:36  _____________________________________________________________________  External Attachment:    Type:   Image     Comment:   External Document

## 2010-06-09 ENCOUNTER — Other Ambulatory Visit: Payer: Self-pay | Admitting: General Surgery

## 2010-06-09 ENCOUNTER — Inpatient Hospital Stay (HOSPITAL_COMMUNITY)
Admission: RE | Admit: 2010-06-09 | Discharge: 2010-06-12 | DRG: 328 | Disposition: A | Discharge status: Home / Self Care | Payer: Medicare Other | Attending: General Surgery | Admitting: General Surgery

## 2010-06-09 DIAGNOSIS — E7800 Pure hypercholesterolemia, unspecified: Secondary | ICD-10-CM | POA: Diagnosis present

## 2010-06-09 DIAGNOSIS — E119 Type 2 diabetes mellitus without complications: Secondary | ICD-10-CM | POA: Diagnosis present

## 2010-06-09 DIAGNOSIS — K449 Diaphragmatic hernia without obstruction or gangrene: Principal | ICD-10-CM | POA: Diagnosis present

## 2010-06-09 DIAGNOSIS — I10 Essential (primary) hypertension: Secondary | ICD-10-CM | POA: Diagnosis present

## 2010-06-09 DIAGNOSIS — K219 Gastro-esophageal reflux disease without esophagitis: Secondary | ICD-10-CM | POA: Diagnosis present

## 2010-06-09 LAB — GLUCOSE, CAPILLARY
Glucose-Capillary: 156 mg/dL — ABNORMAL HIGH (ref 70–99)
Glucose-Capillary: 160 mg/dL — ABNORMAL HIGH (ref 70–99)
Glucose-Capillary: 149 mg/dL — ABNORMAL HIGH (ref 70–99)
Glucose-Capillary: 143 mg/dL — ABNORMAL HIGH (ref 70–99)

## 2010-06-09 LAB — TYPE AND SCREEN: ABO/RH(D): O POS

## 2010-06-10 ENCOUNTER — Ambulatory Visit (HOSPITAL_COMMUNITY): Payer: Medicare Other

## 2010-06-10 LAB — GLUCOSE, CAPILLARY
Glucose-Capillary: 134 mg/dL — ABNORMAL HIGH (ref 70–99)
Glucose-Capillary: 106 mg/dL — ABNORMAL HIGH (ref 70–99)

## 2010-06-10 MED ORDER — IOHEXOL 300 MG/ML  SOLN
50.0000 mL | Freq: Once | INTRAMUSCULAR | Status: AC | PRN
  Administered 2010-06-10: 50 mL via INTRAVENOUS

## 2010-06-11 LAB — GLUCOSE, CAPILLARY
Glucose-Capillary: 100 mg/dL — ABNORMAL HIGH (ref 70–99)
Glucose-Capillary: 110 mg/dL — ABNORMAL HIGH (ref 70–99)
Glucose-Capillary: 124 mg/dL — ABNORMAL HIGH (ref 70–99)

## 2010-06-12 LAB — GLUCOSE, CAPILLARY: Glucose-Capillary: 105 mg/dL — ABNORMAL HIGH (ref 70–99)

## 2010-06-12 NOTE — H&P (Signed)
  [name], [name]            ACCOUNT NO.:  [id number]  MEDICAL RECORD NO.:  [id number]           PATIENT TYPE:  O  LOCATION:  DAYL                         FACILITY:  [facility]  PHYSICIAN:  Adolph Pollack, M.D.DATE OF BIRTH:  [birth year]  DATE OF ADMISSION:  06/09/2010 DATE OF DISCHARGE:                             HISTORY & PHYSICAL   REASON FOR ADMISSION:  Hiatal hernia repair.  HISTORY:  [name] is a 76 year old female who had an acute episode of severe left upper quadrant pain and bloating.  She underwent an evaluation upper GI study demonstrating a paraesophageal hiatal hernia. She also has some gastroesophageal reflux disease.  Because of symptomatic hiatal hernia and gastroesophageal reflux, she now presents for repair.  PAST MEDICAL HISTORY: 1. Hiatal hernia. 2. Gastroesophageal reflux. 3. Hypertension. 4. Hypercholesterolemia. 5. Type 2 diabetes mellitus-diet controlled.  PAST SURGICAL HISTORY: 1. Fistulotomy for repair of fistula-in-ano. 2. Incision and drainage of anorectal abscess. 3. Stapled hemorrhoidectomy.  ALLERGIES:  SULFA.  MEDICATIONS: 1. Maxzide. 2. Tylenol. 3. Aleve. 4. Aspirin. 5. Valium. 6. Atenolol. 7. Lisinopril. 8. Klor-Con. 9. Simvastatin.  SOCIAL HISTORY:  She is married, retired Engineer, site.  No tobacco or alcohol use.  REVIEW OF SYSTEMS:  She had been feeling well lately.  No fever, chills.  PHYSICAL EXAMINATION:  GENERAL:  Mildly overweight female in no acute distress, very pleasant and cooperative. VITAL SIGNS:  Temperature is 96.9, pulse 81, blood pressure is 148/86, respiratory rate 20, and O2 saturations 96% on room air. RESPIRATORY:  Breath sounds equal and clear. CARDIOVASCULAR:  Regular rate, regular rhythm. ABDOMEN:  Soft, nontender.  No masses or organomegaly. EXTREMITIES:  SCD hose on.  IMPRESSION:  Large symptomatic hiatal hernia with gastroesophageal flux.  PLAN:  Laparoscopic hiatal hernia repair  with Nissen fundoplication and gastropexy.  Procedure risks and aftercare were discussed with her preoperatively.     Adolph Pollack, M.D.     Kari Baars  D:  06/09/2010  T:  06/09/2010  Job:  [id number]  Electronically Signed by Avel Peace M.D. on 06/12/2010 02:42:35 PM

## 2010-06-12 NOTE — Op Note (Signed)
[name], [name]            ACCOUNT NO.:  [id number]  MEDICAL RECORD NO.:  [id number]           PATIENT TYPE:  O  LOCATION:  DAYL                         FACILITY:  [facility]  PHYSICIAN:  Adolph Pollack, M.D.DATE OF BIRTH:  1935-03-14  DATE OF PROCEDURE:  06/09/2010 DATE OF DISCHARGE:                              OPERATIVE REPORT   PREOPERATIVE DIAGNOSIS:  Large hiatal hernia with gastroesophageal reflux.  POSTOPERATIVE DIAGNOSIS:  Large hiatal hernia with gastroesophageal reflux.  PROCEDURE: 1. Laparoscopic hiatal hernia repair. 2. Nissen fundoplication (oversized 56 dilator). 3. Gastropexy.  SURGEON:  Adolph Pollack, M.D.  ASSISTANT:  Karie Soda, M.D.  ANESTHESIA:  General.  INDICATIONS:  This is a 76 year old female who has a symptomatic large hiatal hernia with gastroesophageal reflux and she now presents for repair.  The procedure, risks and aftercare were discussed with her preoperatively.  TECHNIQUE:  She was brought to the operating room, placed supine on the operating table and a general anesthetic was administered.  A Foley catheter was inserted.  The abdominal wall was widely sterilely prepped and draped.  She was placed in the slight reverse Trendelenburg position.  In the left upper quadrant subcostal region laterally a small incision was made.  Using a 5-mm Opti-Vu trocar and laparoscope, I gained access into the peritoneal cavity and pneumoperitoneum was created.  Visualization below the trocar site demonstrated no evidence of organ injury or bleeding.  Following this, I then placed an 11-mm trocar superior and lateral to the umbilicus on the left side and the camera was put over into this trocar.  A 5-mm trocar was placed in the right upper quadrant. An 11-mm trocar was placed in the epigastric position.  Another 5-mm trocar was placed in the left upper quadrant.  I then made a 5-mm incision in the subxiphoid area and placed a liver  retractor through this incision and retracted the left lobe of the liver anteriorly, exposing a large hiatal hernia with at least two-thirds of the stomach up into the mediastinum.  The stomach was reduced.  Beginning on the right side, I divided the thin gastrohepatic ligament up to the level of the right crus.  I then began mobilizing the sac by retracting at the mediastinum beginning at the right crus and using blunt dissection the harmonic scalpel to mobilize the sac out of the mediastinum, going around to the anterior aspect of the crus and then carrying this over to the left crus.  This allowed me to reduce the stomach back into the abdominal cavity.  I then excised a large bit of the sac using the harmonic scalpel and removed it and sent it to Pathology.  Following this, I identified the esophagus.  There was some posterior sac which I mobilized free from the esophagus.  The vagus nerve was identified and kept with the esophagus posteriorly.  I then divided more of the sac using the harmonic scalpel from the left crus.  This allowed for circumferential mobilization of the esophageal junction and the stomach.  At this point, 3 to 4 cm of the esophagus was in the intra-abdominal position without tension.  Following this,  a Penrose drain was placed around the gastroesophageal junction and it was retracted anteriorly exposing the large hiatal hernia defect.  I then closed the defect with five pledgeted size 0 interrupted nonabsorbable interrupted sutures.  The closure was not under tension.  I then mobilized the fundus of the stomach by dividing the short gastric vessels from the mid fundus all the way up to the hiatus.  I brought the fundus through the retroesophageal window.  A size 56 bougie was then passed down the esophagus and into the stomach under laparoscopic vision.  I then performed a 360 degrees 2.5 cm fundoplication using size 0 nonabsorbable interrupted sutures.  The  proximal two sutures incorporated the left leaf of the wrap, a small bite of esophagus and the right leaf of the wrap.  The distal-most suture incorporated just the left and right leaves of the wrap.  I then removed the bougie, which was intact.  The wrap was floppy and under no tension.  The first two stitches were marked with hemoclips.  At this point in time, I then inspected the abdominal cavity.  There was no evidence of solid or hollow organ injury and no evidence of bleeding.  I then grasped the stomach at the body antral junction and anchored it to the anterior abdominal wall with two interrupted size 0 sutures for a gastropexy. The right side of the fundoplication was then anchored to the right crus with a single size 0 nonabsorbable suture.  A second inspection of the abdominal cavity demonstrated no evidence of bleeding, solid or hollow organ injury.  The liver retractor was then removed.  The trocars were removed and the pneumoperitoneum was released.  Skin incisions were then closed with 4-0 Monocryl subcuticular stitches. Steri-Strips and sterile dressings were applied.  She tolerated the procedure well without any apparent complications and was taken to the recovery room in satisfactory condition.     Adolph Pollack, M.D.     Kari Baars  D:  06/09/2010  T:  06/09/2010  Job:  [id number]  cc:   Adolph Pollack, M.D. [address], [address] [place] [address]  Anselmo Rod, MD, [facility] Fax: [phone]  Lelon Perla, DO [address] [place], [place] [address]  Electronically Signed by Avel Peace M.D. on 06/12/2010 02:42:23 PM

## 2010-06-20 ENCOUNTER — Other Ambulatory Visit: Payer: Self-pay

## 2010-07-03 ENCOUNTER — Encounter: Payer: Self-pay | Admitting: Family Medicine

## 2010-07-19 ENCOUNTER — Other Ambulatory Visit: Payer: Self-pay | Admitting: Family Medicine

## 2010-07-19 ENCOUNTER — Other Ambulatory Visit (INDEPENDENT_AMBULATORY_CARE_PROVIDER_SITE_OTHER): Payer: Medicare Other

## 2010-07-19 ENCOUNTER — Encounter (INDEPENDENT_AMBULATORY_CARE_PROVIDER_SITE_OTHER): Payer: Self-pay | Admitting: *Deleted

## 2010-07-19 DIAGNOSIS — E785 Hyperlipidemia, unspecified: Secondary | ICD-10-CM

## 2010-07-19 DIAGNOSIS — R7989 Other specified abnormal findings of blood chemistry: Secondary | ICD-10-CM

## 2010-07-19 LAB — LIPID PANEL
Cholesterol: 128 mg/dL (ref 0–200)
LDL Cholesterol: 55 mg/dL (ref 0–99)

## 2010-07-19 LAB — HEPATIC FUNCTION PANEL
ALT: 17 U/L (ref 0–35)
AST: 25 U/L (ref 0–37)
Albumin: 4.2 g/dL (ref 3.5–5.2)
Total Bilirubin: 1 mg/dL (ref 0.3–1.2)

## 2010-07-19 LAB — BASIC METABOLIC PANEL
BUN: 9 mg/dL (ref 6–23)
Chloride: 106 mEq/L (ref 96–112)
Potassium: 4.4 mEq/L (ref 3.5–5.1)

## 2010-07-19 LAB — HEMOGLOBIN A1C: Hgb A1c MFr Bld: 6 % (ref 4.6–6.5)

## 2010-07-24 NOTE — Discharge Summary (Signed)
  [name], [name]            ACCOUNT NO.:  [id number]  MEDICAL RECORD NO.:  [id number]           PATIENT TYPE:  I  LOCATION:  1540                         FACILITY:  [facility]  PHYSICIAN:  Adolph Pollack, M.D.DATE OF BIRTH:  06-16-1934  DATE OF ADMISSION:  06/09/2010 DATE OF DISCHARGE:  06/12/2010                              DISCHARGE SUMMARY   FINAL DISCHARGE DIAGNOSIS:  Large hiatal hernia.  SECONDARY DIAGNOSIS: 1. Gastroesophageal reflux. 2. Hypertension. 3. Hypercholesterolemia. 4. Type 2 diabetes mellitus.  PROCEDURE:  Laparoscopic hiatal hernia repair with Nissen fundoplication and gastropexy, June 09, 2010.  REASON FOR ADMISSION:  Ms. Row is a 76 year old female who has a symptomatic large hiatal hernia and also has gastroesophageal reflux disease, now presents for the above and was admitted for the above procedure.  HOSPITAL COURSE:  She underwent the above procedure which she tolerated well.  Her first postoperative day, water-soluble upper GI study showed no leak and she was started on some clear liquids.  She is on advanced to full liquids and tolerated this by her second postop day.  Blood sugars were under good control.  She is able to be discharged to home on her third postop day, June 12, 2010.  DISPOSITION:  Discharged home June 12, 2010.  She was given activity restrictions.  She will start on a level I diet which had been explained to her.  She will follow up with me in the office in 2 to 3 weeks.  She is to continue her home medications and was given a prescription for an analgesic as well.     Adolph Pollack, M.D.     Kari Baars  D:  07/15/2010  T:  07/15/2010  Job:  [id number]  cc:   Anselmo Rod, MD, [facility] Fax: [phone]  Lelon Perla, DO [address] [place], [place] [address]  Electronically Signed by Avel Peace M.D. on 07/24/2010 09:16:38 AM

## 2010-07-25 ENCOUNTER — Other Ambulatory Visit: Payer: Self-pay | Admitting: General Surgery

## 2010-07-25 NOTE — Letter (Signed)
Summary: [facility]   Imported By: Maryln Gottron 07/14/2010 14:36:51  _____________________________________________________________________  External Attachment:    Type:   Image     Comment:   External Document

## 2010-08-01 ENCOUNTER — Ambulatory Visit
Admission: RE | Admit: 2010-08-01 | Discharge: 2010-08-01 | Disposition: A | Discharge status: Home / Self Care | Payer: Medicare Other | Source: Ambulatory Visit | Attending: General Surgery | Admitting: General Surgery

## 2010-08-02 ENCOUNTER — Encounter: Payer: Self-pay | Admitting: Family Medicine

## 2010-08-03 ENCOUNTER — Ambulatory Visit (INDEPENDENT_AMBULATORY_CARE_PROVIDER_SITE_OTHER): Payer: Medicare Other | Admitting: Family Medicine

## 2010-08-03 ENCOUNTER — Encounter: Payer: Self-pay | Admitting: Family Medicine

## 2010-08-03 DIAGNOSIS — E119 Type 2 diabetes mellitus without complications: Secondary | ICD-10-CM

## 2010-08-03 DIAGNOSIS — I10 Essential (primary) hypertension: Secondary | ICD-10-CM

## 2010-08-03 DIAGNOSIS — F4323 Adjustment disorder with mixed anxiety and depressed mood: Secondary | ICD-10-CM | POA: Insufficient documentation

## 2010-08-03 DIAGNOSIS — E876 Hypokalemia: Secondary | ICD-10-CM

## 2010-08-03 DIAGNOSIS — E785 Hyperlipidemia, unspecified: Secondary | ICD-10-CM

## 2010-08-03 MED ORDER — TRIAMTERENE-HCTZ 75-50 MG PO TABS: 1.0000 | ORAL_TABLET | Freq: Every day | ORAL | Status: DC

## 2010-08-03 MED ORDER — ATENOLOL 50 MG PO TABS: 50.0000 mg | ORAL_TABLET | Freq: Every day | ORAL | Status: DC

## 2010-08-03 MED ORDER — SIMVASTATIN 40 MG PO TABS: 20.0000 mg | ORAL_TABLET | Freq: Every day | ORAL | Status: DC

## 2010-08-03 MED ORDER — POTASSIUM CHLORIDE 20 MEQ PO PACK: 20.0000 meq | PACK | Freq: Every day | ORAL | Status: DC

## 2010-08-03 MED ORDER — DIAZEPAM 5 MG PO TABS: 2.5000 mg | ORAL_TABLET | Freq: Every day | ORAL | Status: DC

## 2010-08-03 MED ORDER — LISINOPRIL 20 MG PO TABS: 20.0000 mg | ORAL_TABLET | Freq: Every day | ORAL | Status: DC

## 2010-08-03 NOTE — Progress Notes (Signed)
  Subjective:    Patient ID: [name], female    DOB: [birth year], 76 y.o.   MRN: [id number]  HPI HYPERTENSION Disease Monitoring Blood pressure range-? Chest pain- no      Dyspnea- no Medications Compliance- good Lightheadedness- no   Edema- no   DIABETES Disease Monitoring Blood Sugar ranges -not checking Polyuria- no New Visual problems- no-- optho due may Medications Compliance- no meds Hypoglycemic symptoms- n0   HYPERLIPIDEMIA Disease Monitoring See symptoms for Hypertension Medications Compliance- good RUQ pain- no  Muscle aches- no  ROS See HPI above   PMH Smoking Status noted    Review of Systems See HPI     Objective:   Physical Exam  Constitutional: She is oriented to person, place, and time. She appears well-developed and well-nourished. No distress.  Neck: Normal range of motion. Neck supple.  Cardiovascular: Normal rate and normal heart sounds.   No murmur heard. Pulmonary/Chest: Effort normal and breath sounds normal. No respiratory distress.  Musculoskeletal: She exhibits no edema and no tenderness.  Neurological: She is alert and oriented to person, place, and time.  Psychiatric: She has a normal mood and affect.          Assessment & Plan:

## 2010-08-03 NOTE — Assessment & Plan Note (Signed)
Uses diazepam rarely 

## 2010-08-03 NOTE — Assessment & Plan Note (Signed)
Lower dose zocor Labs reviewed Recheck 6 months

## 2010-08-03 NOTE — Assessment & Plan Note (Signed)
Diet controlled  check bld sugars  Check labs 6 months

## 2010-08-03 NOTE — Assessment & Plan Note (Signed)
con't K supplement

## 2010-08-03 NOTE — Assessment & Plan Note (Signed)
con't meds---she did not take it today Ov 6 months

## 2010-08-03 NOTE — Patient Instructions (Signed)
Hypertension (High Blood Pressure) As your heart beats, it forces blood through your arteries. This force is your blood pressure. If the pressure is too high, it is called hypertension (HTN) or high blood pressure. HTN is dangerous because you may have it and not know it. High blood pressure may mean that your heart has to work harder to pump blood. Your arteries may be narrow or stiff. The extra work puts you at risk for heart disease, stroke, and other problems.  Blood pressure consists of two numbers, a higher number over a lower, 110/72, for example. It is stated as "110 over 72." The ideal is below 120 for the top number (systolic) and under 80 for the bottom (diastolic). Write down your blood pressure today. You should pay close attention to your blood pressure if you have certain conditions such as:  Heart failure.  Prior heart attack.   Diabetes   Chronic kidney disease.   Prior stroke.   Multiple risk factors for heart disease.   To see if you have HTN, your blood pressure should be measured while you are seated with your arm held at the level of the heart. It should be measured at least twice. A one-time elevated blood pressure reading (especially in the Emergency Department) does not mean that you need treatment. There may be conditions in which the blood pressure is different between your right and left arms. It is important to see your caregiver soon for a recheck. Most people have essential hypertension which means that there is not a specific cause. This type of high blood pressure may be lowered by changing lifestyle factors such as:  Stress.  Smoking.   Lack of exercise.   Excessive weight.  Drug/tobacco/alcohol use.   Eating less salt.   Most people do not have symptoms from high blood pressure until it has caused damage to the body. Effective treatment can often prevent, delay or reduce that damage. TREATMENT Treatment for high blood pressure, when a cause has been  identified, is directed at the cause. There are a large number of medications to treat HTN. These fall into several categories, and your caregiver will help you select the medicines that are best for you. Medications may have side effects. You should review side effects with your caregiver. If your blood pressure stays high after you have made lifestyle changes or started on medicines,   Your medication(s) may need to be changed.   Other problems may need to be addressed.   Be certain you understand your prescriptions, and know how and when to take your medicine.   Be sure to follow up with your caregiver within the time frame advised (usually within two weeks) to have your blood pressure rechecked and to review your medications.   If you are taking more than one medicine to lower your blood pressure, make sure you know how and at what times they should be taken. Taking two medicines at the same time can result in blood pressure that is too low.  SEEK IMMEDIATE MEDICAL CARE IF YOU DEVELOP:  A severe headache, blurred or changing vision, or confusion.   Unusual weakness or numbness, or a faint feeling.   Severe chest or abdominal pain, vomiting, or breathing problems.  MAKE SURE YOU:   Understand these instructions.   Will watch your condition.   Will get help right away if you are not doing well or get worse.  Document Released: 04/23/2005 Document Re-Released: 10/11/2009 Exit[employer] Patient Information 2011 [employer].Diabetes Meal Planning Guide The diabetes meal planning guide is a tool to help you plan your meals and snacks. It is important for people with diabetes to manage their blood sugar levels. Choosing the right foods and the right amounts throughout your day will help control your blood sugar. Eating right can even help you improve your blood pressure and reach or maintain a healthy weight. CARBOHYDRATE COUNTING MADE EASY When you eat carbohydrates, they turn to sugar  (glucose). This raises your blood sugar level. Counting carbohydrates can help you control this level so you feel better. When you plan your meals by counting carbohydrates, you can have more flexibility in what you eat and balance your medicine with your food intake. Carbohydrate counting simply means adding up the total amount of carbohydrate grams (g) in your meals or snacks. Try to eat about the same amount at each meal. Foods with carbohydrates are listed below. Each portion below is 1 carbohydrate serving or 15 grams of carbohydrates. Ask your dietician how many grams of carbohydrates you should eat at each meal or snack. Grains and Starches 1 slice bread 1/2 English muffin or hotdog/hamburger bun 3/4 cup cold cereal (unsweetened) 1/3 cup cooked pasta or rice 1/2 cup starchy vegetables (corn, potatoes, peas, beans, winter squash) 1 tortilla (6 inches) 1/4 bagel 1 waffle or pancake (size of a CD) 1/2 cup cooked cereal 4 to 6 small crackers *Whole grain is recommended Fruit 1 cup fresh unsweetened berries, melon, papaya, pineapple 1 small fresh fruit 1/2 banana or mango 1/2 cup fruit juice (4 ounces unsweetened) 1/2 cup canned fruit in natural juice or water 2 tablespoons dried fruit 12 to 15 grapes or cherries Milk and Yogurt 1 cup fat-free or 1% milk 1 cup soy milk 6 ounces light yogurt with sugar-free sweetener 6 ounces low-fat soy yogurt 6 ounces plain yogurt Vegetables 1 cup raw or 1/2 cup cooked is counted as 0 carbohydrates or a "free" food. If you eat 3 or more servings at one meal, count them as 1 carbohydrate serving. Other Carbohydrates 3/4 ounces chips or pretzels 1/2 cup ice cream or frozen yogurt 1/4 cup sherbet or sorbet 2 inch square cake, no frosting 1 tablespoon honey, sugar, jam, jelly, or syrup 2 small cookies 3 squares of graham crackers 3 cups popcorn 6 crackers 1 cup broth-based soup Count 1 cup casserole or other mixed foods as 2 carbohydrate  servings. Foods with less than 20 calories in a serving may be counted as 0 carbohydrates or a "free" food. You may want to purchase a book or computer software that lists the carbohydrate gram counts of different foods. In addition, the nutrition facts panel on the labels of the foods you eat are a good source of this information. The label will tell you how big the serving size is and the total number of carbohydrate grams you will be eating per serving. Divide this number by 15 to obtain the number of carbohydrate servings in a portion. Remember: 1 carbohydrate serving equals 15 grams of carbohydrate. SERVING SIZES Measuring foods and serving sizes helps you make sure you are getting the right amount of food. The list below tells how big or small some common serving sizes are.  1 ounce (oz) of cheese.................................4 stacked dice.   2 to 3 oz cooked meat.................................Marland KitchenDeck of cards.   1 teaspoon (tsp)...........................................Marland KitchenTip of little finger.   1 tablespoon (tbs).......................................Marland KitchenMarland KitchenThumb.   2 tbs............................................................Marland KitchenGolf ball.    cup..........................................................Marland KitchenHalf of a fist.   1 cup...........................................................Marland KitchenA  fist.  SAMPLE DIABETES MEAL PLAN Below is a sample meal plan that includes foods from the grain and starches, dairy, vegetable, fruit, and meat groups. A dietician can individualize a meal plan to fit your calorie needs and tell you the number of servings needed from each food group. However, controlling the total amount of carbohydrates in your meal or snack is more important than making sure you include all of the food groups at every meal. You may interchange carbohydrate containing foods (dairy, starches, and fruits). The meal plan below is an example of a 2000 calorie diet using carbohydrate  counting. This meal plan has 17 carbohydrate servings (carb choices). Breakfast 1 cup oatmeal (2 carb choices) 3/4 cup light yogurt (1 carb choice) 1 cup blueberries (1 carb choice) 1/4 cup almonds  Snack 1 large apple (2 carb choices) 1 low-fat string cheese stick  Lunch Chicken breast salad:  1 cup spinach   1/4 cup chopped tomatoes   2 oz chicken breast, sliced   2 tbs low-fat [place] dressing  12 whole-wheat crackers (2 carb choices) 12 to 15 grapes (1 carb choice) 1 cup low-fat milk (1 carb choice)  Snack 1 cup carrots 1/2 cup hummus (1 carb choice)  Dinner 3 oz broiled salmon 1 cup brown rice (3 carb choices)  Snack 1 1/2 cups steamed broccoli (1 carb choice) drizzled with 1 tsp olive oil and lemon juice 1 cup light pudding (2 carb choices)  DIABETES MEAL PLANNING WORKSHEET Your dietician can use this worksheet to help you decide how many servings of foods and what types of foods are right for you.  Breakfast Food Group and Servings Carb Choices Grain/Starches _______________________________________ Dairy ______________________________________________ Vegetable _______________________________________ Fruit _______________________________________________ Meat _______________________________________________ Fat _____________________________________________ Lunch Food Group and Servings Carb Choices Grain/Starches ________________________________________ Dairy _______________________________________________ Fruit ________________________________________________ Meat ________________________________________________ Fat _____________________________________________ Dinner Food Group and Servings Carb Choices Grain/Starches ________________________________________ Dairy _______________________________________________ Fruit ________________________________________________ Meat ________________________________________________ Fat  _____________________________________________ Snacks Food Group and Servings Carb Choices Grain/Starches ________________________________________ Dairy _______________________________________________ Vegetable ________________________________________ Fruit ________________________________________________ Meat ________________________________________________ Fat _____________________________________________ Daily Totals Starches _________________________ Vegetable __________________________ Fruit ______________________________ Dairy ______________________________ Meat ______________________________ Fat ________________________________  Document Released: 01/18/2005 Document Re-Released: 10/11/2009 ExitCare Patient Information 2011 [place].

## 2010-09-20 ENCOUNTER — Encounter (INDEPENDENT_AMBULATORY_CARE_PROVIDER_SITE_OTHER): Payer: Self-pay | Admitting: General Surgery

## 2010-09-22 NOTE — Op Note (Signed)
NAMESKYELAR, HALLIDAY            ACCOUNT NO.:  [id number]   MEDICAL RECORD NO.:  [id number]          PATIENT TYPE:  AMB   LOCATION:  DAY                          [facility]AC[facility]  [facility]   PHYSICIAN:  Adolph Pollack, M.D.DATE OF BIRTH:  16-May-1934   DATE OF PROCEDURE:  12/29/2004  DATE OF DISCHARGE:                                 OPERATIVE REPORT   PREOPERATIVE DIAGNOSIS:  Anorectal abscess.   POSTOPERATIVE DIAGNOSIS:  Anorectal abscess, fistula-in-ano.   PROCEDURE:  1.  Exam under anesthesia.  2.  Incision and drainage of anorectal abscess.  3.  Fistulotomy.   SURGEON:  Adolph Pollack, M.D.   ANESTHESIA:  General.   INDICATIONS:  This 76 year old female underwent a PPH procedure and lateral  internal sphincterotomy by Dr. Lovie Chol 1 week ago. She had been  having fairly significant pain and apparently went to the office Wednesday  and was noted to have a left-sided anorectal abscess and a simple incision  and drainage performed. However, she continued to have significant pain  presented to the Urgent office today and saw Dr. Carolynne Edouard who was concerned not  only about the anorectal abscess but a fistula-in-ano. She was sent to  [facility] and is brought to the operating room. The procedure and the  risks were discussed with her and her husband preoperatively.   TECHNIQUE:  She is brought to the operating room, placed supine on the  operating table and a general anesthetic was administered. She is then  placed in the lithotomy position. The perianal area was sterilely prepped  and draped. Exam under anesthesia was performed. A digital rectal exam was  performed and I could feel the PPH staple line but also a defect in the left  lateral rectal wall. There was a small puncture wound present in the left  anorectal area. I probed this with the hemostat and noted pus/purulent  drainage coming out of the rectum. There was a fistula connection that was  easily demonstrated  in the subcutaneous tissue. I subsequently performed a  fistulotomy opening up the fistulous tract. I then debrided the wound of  some indurated tissue. Following this, I inspected the rest of anal area and  noticed a fissure at approximately the 7 o'clock position. Hemostasis were  controlled with electrocautery. The abscess cavity/fistula went up just  lateral to the staple line on the left side. I put a drain up into this  lateral recess and anchored it to the anal mucosa with 3-0 chromic suture.  The drain consisted of a 1/4 inch Penrose. Following this a bulky dressing  was applied. The perianal block was performed with Marcaine before the  dressing was applied.   She tolerated the procedure without any apparent complications and was taken  to the recovery room in satisfactory condition. She will be kept overnight  on intravenous antibiotics in the hospital for observation.      Adolph Pollack, M.D.  Electronically Signed     TJR/MEDQ  D:  12/29/2004  T:  12/29/2004  Job:  [id number]

## 2010-09-22 NOTE — Op Note (Signed)
NAMEALEXEE, [name]            ACCOUNT NO.:  [id number]   MEDICAL RECORD NO.:  [id number]          PATIENT TYPE:  AMB   LOCATION:  [facility]                         FACILITY:  [facility]   PHYSICIAN:  Vikki Ports, MDDATE OF BIRTH:  [birth year]   DATE OF PROCEDURE:  12/22/2004  DATE OF DISCHARGE:  12/22/2004                                 OPERATIVE REPORT   PREOPERATIVE DIAGNOSIS:  Anal stenosis, anal fissure, and internal  hemorrhoids.   PROCEDURE:  Procedure for prolapsing hemorrhoids hemorrhoidectomy, anal  dilatation, and lateral sphincterotomy.   ANESTHESIA:  General.   SURGEON:  Vikki Ports, MD   DESCRIPTION:  The patient was taken to the operating room and placed in a  supine position.  After adequate general anesthesia was induced using  endotracheal tube, the patient was placed in the prone jack-knife position.  Perianal and rectal prep were undertaken.  Anal dilatation was accomplished  but only to about two fingerbreadths, as the anal sphincter was very tight.  I opted to perform left lateral internal sphincterotomy.  A small incision  was made in the mucosa.  The sphincter muscle was then isolated using a  Hemostat and divided using Bovie electrocautery.  This wound was irrigated.   I then placed a 2-0 Prolene suture 5 cm proximal to the dentate line.  The  [facility] stapler was introduced, closed, held in place, and fired.  A good rim of  hemorrhoidal tissue was identified.  Staple line was inspected, and there  was no bleeding.  Gelfoam packing was placed.  The patient tolerated the  procedure well and went to the PACU in good condition.      Vikki Ports, MD  Electronically Signed     KRH/MEDQ  D:  01/23/2005  T:  01/23/2005  Job:  [id number]

## 2010-09-22 NOTE — Op Note (Signed)
[name], [name]            ACCOUNT NO.:  [id number]   MEDICAL RECORD NO.:  [id number]          PATIENT TYPE:  OBV   LOCATION:  9310                          FACILITY:  [facility]   PHYSICIAN:  Michelle L. Grewal, M.D.DATE OF BIRTH:  July 16, 1934   DATE OF PROCEDURE:  10/20/2004  DATE OF DISCHARGE:                                 OPERATIVE REPORT   PREOPERATIVE DIAGNOSIS:  Procidentia.   POSTOPERATIVE DIAGNOSIS:  Procidentia.   PROCEDURE:  Total vaginal hysterectomy and anterior and posterior repair.   SURGEON:  Dr. Vincente Poli   ASSISTANT:  Dr. Rosalia Hammers   ANESTHESIA:  General.   SPECIMENS:  Uterus and cervix.   DESCRIPTION OF PROCEDURE:  The patient was taken to the operating room.  She  was intubated without difficulty.  She was placed in a high lithotomy  position.  She was prepped and draped in the usual sterile fashion.  A Foley  catheter was inserted into the bladder.  When the total procidentia was  noted, we then first started with a circumferential incision around the  cervix and pushing the redundant vaginal epithelium away from the cervix, we  then used curved Heaney clamps to clamp one of the uterosacral ligament.  Each pedicle was cut and suture ligated using 0 Vicryl suture.  We entered  the posterior cul-de-sac sharply and then the anterior cul-de-sac sharply as  well and worked our way up along the broad ligament and along the cervix  using a curved Heaney clamp.  Each pedicle was cut and suture ligated using  0 Vicryl suture.  Once we had reached the level of the triple pedicle, the  uterus was retroflexed, and a curved Heaney clamp was placed across each  pedicle.  Each specimen was cut and suture ligated using 0 Vicryl suture and  further sutures using a tie of 0 Vicryl suture.  At this point, hemostasis  was noted.  The ovaries were inspected and noted to be very normal and very  atrophic and affixed to the pelvic sidewalls, so it was elected not to  remove them.  At  this point, we elected to proceed with repair of the grade  3 cystocele by grasping each side of the vaginal apex with Allis clamps and  making a midline incision all the way up to the UV junction with Metzenbaum  scissors, dissecting the vesicovaginal fascia free and reducing the  cystocele nicely.  We then closed the cystocele using a series of  pursestring sutures using 0 Vicryl suture x 4 with good effect.  The  redundant vaginal epithelium was trimmed and the vaginal epithelium  anteriorly was closed using continuous running locked __________ 0 Vicryl  suture.  At this point, we decided to proceed with the posterior repair.  Allis clamps were placed at 5 and 7 o'clock.  A V-shaped incision was made  in the perineum, and the rectovaginal fascia was dissected free of the  posterior wall of the vagina all the way up to the open vaginal cuff.  We  then reduced the rectocele and enterocele nicely using a series of  interrupteds of 0  Vicryl suture, trimmed the redundant vaginal epithelium  and then closed the cuff and the posterior vaginal wall using continuous  running locked stitches of 0 Vicryl suture.  The perineum was reinforced  with 0 Vicryl stitch, and the perineum was closed using 0 Vicryl stitch.  At  the end of the procedure, no bleeding was noted.  She had excellent support  anteriorly, posteriorly in the vaginal vault.  Vaginal  packing was placed in the vagina soaked in Estrace cream which will be  removed in the morning.  The Foley was draining clear urine.  The patient  was taken to the recovery room awake and in stable condition.  All sponge,  lap, and instrument counts were correct x 2.       MLG/MEDQ  D:  10/20/2004  T:  10/20/2004  Job:  [id number]

## 2010-09-22 NOTE — Assessment & Plan Note (Signed)
[facility]                                   ON-CALL NOTE   NAME:[name], Kathryn                   MRN:          [id number]  DATE:03/09/2006                            DOB:          1934/09/19    [name] is a patient of Dr. Laury Axon, [place].  She  was seen at emergent care thereafter, and was diagnosed with a fractured  nose.  She was advised that she needs to follow up with an ENT on Monday.  She was told to call the emergent care to schedule that appointment.  The  patient would prefer to go through [employer] and her primary care  [name] to get this scheduled.  The patient reports that she is not  bleeding from her nose, denies any headaches or blurry vision.   PLAN:  I advised the patient to call Dr. Ernst Spell office on Monday morning to  discuss what transpired over the weekend.  She can either be scheduled to  see ENT, or be reassessed by Dr. Laury Axon.  She can call the doctor on call  this weekend if she has any concerns.  The patient expressed understanding.    ______________________________  Leanne Chang, M.D.    LA/MedQ  DD: 03/09/2006  DT: 03/10/2006  Job #: [id number]

## 2011-01-22 ENCOUNTER — Other Ambulatory Visit: Payer: Self-pay | Admitting: Family Medicine

## 2011-01-22 DIAGNOSIS — E785 Hyperlipidemia, unspecified: Secondary | ICD-10-CM

## 2011-01-22 DIAGNOSIS — F4323 Adjustment disorder with mixed anxiety and depressed mood: Secondary | ICD-10-CM

## 2011-01-22 DIAGNOSIS — E876 Hypokalemia: Secondary | ICD-10-CM

## 2011-01-22 DIAGNOSIS — E119 Type 2 diabetes mellitus without complications: Secondary | ICD-10-CM

## 2011-01-22 DIAGNOSIS — I10 Essential (primary) hypertension: Secondary | ICD-10-CM

## 2011-01-23 ENCOUNTER — Other Ambulatory Visit (INDEPENDENT_AMBULATORY_CARE_PROVIDER_SITE_OTHER): Payer: Medicare Other

## 2011-01-23 DIAGNOSIS — E785 Hyperlipidemia, unspecified: Secondary | ICD-10-CM

## 2011-01-23 DIAGNOSIS — E119 Type 2 diabetes mellitus without complications: Secondary | ICD-10-CM

## 2011-01-23 LAB — BASIC METABOLIC PANEL
BUN: 27 mg/dL — ABNORMAL HIGH (ref 6–23)
Calcium: 9.7 mg/dL (ref 8.4–10.5)
Creatinine, Ser: 1 mg/dL (ref 0.4–1.2)
GFR: 57.56 mL/min — ABNORMAL LOW (ref >60.00)
Glucose, Bld: 84 mg/dL (ref 70–99)

## 2011-01-23 LAB — HEPATIC FUNCTION PANEL
ALT: 21 U/L (ref 0–35)
AST: 26 U/L (ref 0–37)
Bilirubin, Direct: 0 mg/dL (ref 0.0–0.3)
Total Bilirubin: 0.6 mg/dL (ref 0.3–1.2)

## 2011-01-23 LAB — LIPID PANEL: Total CHOL/HDL Ratio: 2

## 2011-02-09 ENCOUNTER — Ambulatory Visit (INDEPENDENT_AMBULATORY_CARE_PROVIDER_SITE_OTHER): Payer: Medicare Other | Admitting: Family Medicine

## 2011-02-09 ENCOUNTER — Encounter: Payer: Self-pay | Admitting: Family Medicine

## 2011-02-09 VITALS — BP 170/100 | HR 64 | Temp 97.9°F | Ht 62.0 in | Wt 130.8 lb

## 2011-02-09 DIAGNOSIS — Z0000 Encounter for general adult medical examination without abnormal findings: Secondary | ICD-10-CM

## 2011-02-09 DIAGNOSIS — E119 Type 2 diabetes mellitus without complications: Secondary | ICD-10-CM

## 2011-02-09 DIAGNOSIS — F419 Anxiety disorder, unspecified: Secondary | ICD-10-CM

## 2011-02-09 DIAGNOSIS — I10 Essential (primary) hypertension: Secondary | ICD-10-CM

## 2011-02-09 DIAGNOSIS — F4323 Adjustment disorder with mixed anxiety and depressed mood: Secondary | ICD-10-CM

## 2011-02-09 DIAGNOSIS — Z23 Encounter for immunization: Secondary | ICD-10-CM

## 2011-02-09 DIAGNOSIS — E876 Hypokalemia: Secondary | ICD-10-CM

## 2011-02-09 DIAGNOSIS — E785 Hyperlipidemia, unspecified: Secondary | ICD-10-CM

## 2011-02-09 MED ORDER — LISINOPRIL 40 MG PO TABS: 40.0000 mg | ORAL_TABLET | Freq: Every day | ORAL | Status: DC

## 2011-02-09 MED ORDER — SIMVASTATIN 40 MG PO TABS: 20.0000 mg | ORAL_TABLET | Freq: Every day | ORAL | Status: DC

## 2011-02-09 MED ORDER — ZOSTER VACCINE LIVE 19400 UNT/0.65ML ~~LOC~~ SOLR: 0.6500 mL | Freq: Once | SUBCUTANEOUS | Status: DC

## 2011-02-09 MED ORDER — TRIAMTERENE-HCTZ 75-50 MG PO TABS: 0.5000 | ORAL_TABLET | Freq: Every day | ORAL | Status: DC

## 2011-02-09 MED ORDER — DIAZEPAM 5 MG PO TABS: ORAL_TABLET | ORAL | Status: DC

## 2011-02-09 MED ORDER — GLUCOSE BLOOD VI STRP: ORAL_STRIP | Status: DC

## 2011-02-09 MED ORDER — POTASSIUM CHLORIDE 20 MEQ PO PACK: 20.0000 meq | PACK | Freq: Every day | ORAL | Status: DC

## 2011-02-09 MED ORDER — LISINOPRIL 20 MG PO TABS: 20.0000 mg | ORAL_TABLET | Freq: Every day | ORAL | Status: DC

## 2011-02-09 MED ORDER — ATENOLOL 50 MG PO TABS: 50.0000 mg | ORAL_TABLET | Freq: Every day | ORAL | Status: DC

## 2011-02-09 NOTE — Patient Instructions (Signed)

## 2011-02-09 NOTE — Assessment & Plan Note (Signed)
Elevated---increase lisinopril to 40 mg qd

## 2011-02-09 NOTE — Assessment & Plan Note (Signed)
Stable See labs--reviewed with con't meds

## 2011-02-09 NOTE — Progress Notes (Signed)
Subjective:    [name] is a 76 y.o. female who presents for Medicare Annual/Subsequent preventive examination.  Preventive Screening-Counseling & Management  Tobacco History  Smoking status  . Never Smoker   Smokeless tobacco  . Never Used     Problems Prior to Visit 1.   Current Problems (verified) Patient Active Problem List  Diagnoses  . DIABETES MELLITUS, TYPE II  . HYPERLIPIDEMIA  . HYPERTENSION  . ABSCESS, RECTUM  . FATTY LIVER DISEASE  . LIVER FUNCTION TESTS, ABNORMAL  . FOOT INJURY  . THYROID NODULE, HX OF  . TONSILLECTOMY AND ADENOIDECTOMY, HX OF  . HEMORRHOIDECTOMY, HX OF  . Hypokalemia  . Situational mixed anxiety and depressive disorder    Medications Prior to Visit Current Outpatient Prescriptions on File Prior to Visit  Medication Sig Dispense Refill  . aspirin 81 MG tablet Take 81 mg by mouth daily.        . Lancets (FREESTYLE) lancets 1 each by Other route as needed. Use as instructed       . DISCONTD: atenolol (TENORMIN) 50 MG tablet Take 1 tablet (50 mg total) by mouth daily.  90 tablet  3  . DISCONTD: diazepam (VALIUM) 5 MG tablet Take 0.5 tablets (2.5 mg total) by mouth daily.  30 tablet  2  . DISCONTD: lisinopril (PRINIVIL,ZESTRIL) 20 MG tablet Take 1 tablet (20 mg total) by mouth daily.  90 tablet  3  . DISCONTD: potassium chloride (KLOR-CON) 20 MEQ packet Take 20 mEq by mouth daily.  90 tablet  3  . DISCONTD: simvastatin (ZOCOR) 40 MG tablet Take 0.5 tablets (20 mg total) by mouth at bedtime.  90 tablet  3    Current Medications (verified) Current Outpatient Prescriptions  Medication Sig Dispense Refill  . aspirin 81 MG tablet Take 81 mg by mouth daily.        Marland Kitchen atenolol (TENORMIN) 50 MG tablet Take 1 tablet (50 mg total) by mouth daily.  90 tablet  3  . diazepam (VALIUM) 5 MG tablet 1 po qhs prn  30 tablet  2  . glucose blood (FREESTYLE TEST STRIPS) test strip Use as instructed  100 each  0  . Lancets (FREESTYLE) lancets 1 each  by Other route as needed. Use as instructed       . lisinopril (PRINIVIL,ZESTRIL) 20 MG tablet Take 1 tablet (20 mg total) by mouth daily.  90 tablet  3  . potassium chloride (KLOR-CON) 20 MEQ packet Take 20 mEq by mouth daily.  90 tablet  3  . simvastatin (ZOCOR) 40 MG tablet Take 0.5 tablets (20 mg total) by mouth at bedtime.  90 tablet  3  . triamterene-hydrochlorothiazide (MAXZIDE) 75-50 MG per tablet Take 0.5 tablets by mouth daily.  45 tablet  3  . DISCONTD: atenolol (TENORMIN) 50 MG tablet Take 1 tablet (50 mg total) by mouth daily.  90 tablet  3  . DISCONTD: diazepam (VALIUM) 5 MG tablet Take 0.5 tablets (2.5 mg total) by mouth daily.  30 tablet  2  . DISCONTD: lisinopril (PRINIVIL,ZESTRIL) 20 MG tablet Take 1 tablet (20 mg total) by mouth daily.  90 tablet  3  . DISCONTD: potassium chloride (KLOR-CON) 20 MEQ packet Take 20 mEq by mouth daily.  90 tablet  3  . DISCONTD: simvastatin (ZOCOR) 40 MG tablet Take 0.5 tablets (20 mg total) by mouth at bedtime.  90 tablet  3  . KLOR-CON M20 20 MEQ tablet       .  zoster vaccine live, PF, (ZOSTAVAX) 16109 UNT/0.65ML injection Inject 19,400 Units into the skin once.  1 vial  0     Allergies (verified) Sulfonamide derivatives   PAST HISTORY  Family History Family History  Problem Relation Age of Onset  . Diabetes    . Hypertension    . Other Brother     Rhabdo secondary to Statin  . Diabetes Brother   . Hypertension Brother   . Diabetes Mother   . Hypertension Mother   . Hypertension Sister     Social History History  Substance Use Topics  . Smoking status: Never Smoker   . Smokeless tobacco: Never Used  . Alcohol Use: No     Are there smokers in your home (other than you)? No  Risk Factors Current exercise habits: Home exercise routine includes walking 1/4 hrs per day.  Dietary issues discussed: na   Cardiac risk factors: advanced age (older than 18 for men, 67 for women), diabetes mellitus, dyslipidemia and  hypertension.  Depression Screen (Note: if answer to either of the following is "Yes", a more complete depression screening is indicated)   Over the past two weeks, have you felt down, depressed or hopeless? No  Over the past two weeks, have you felt little interest or pleasure in doing things? No  Have you lost interest or pleasure in daily life? No  Do you often feel hopeless? No  Do you cry easily over simple problems? No  Activities of Daily Living In your present state of health, do you have any difficulty performing the following activities?:  Driving? No Managing money?  No Feeding yourself? No Getting from bed to chair? No Climbing a flight of stairs? No Preparing food and eating?: No Bathing or showering? No Getting dressed: No Getting to the toilet? No Using the toilet:No Moving around from place to place: No In the past year have you fallen or had a near fall?:no   Are you sexually active?  No  Do you have more than one partner?  No  Hearing Difficulties: No Do you often ask people to speak up or repeat themselves? No Do you experience ringing or noises in your ears? No Do you have difficulty understanding soft or whispered voices? No   Do you feel that you have a problem with memory? No  Do you often misplace items? No  Do you feel safe at home?  No  Cognitive Testing  Alert? Yes  Normal Appearance?Yes  Oriented to person? Yes  Place? Yes   Time? Yes  Recall of three objects?  Yes  Can perform simple calculations? Yes  Displays appropriate judgment?Yes  Can read the correct time from a watch face?Yes   Advanced Directives have been discussed with the patient? Yes  List the Names of Other Physician/Practitioners you currently use: 1.  Dentist--Dr Ferd Glassing 2. optho-- Dr Greta Doom 3 gyn---Grewal  Indicate any recent Medical Services you may have received from other than [facility] providers in the past year (date may be approximate).  Immunization History   Administered Date(s) Administered  . Influenza Whole 02/24/2008  . Pneumococcal Polysaccharide 05/17/2004  . Td 05/17/2004    Screening Tests Health Maintenance  Topic Date Due  . Zostavax  10/01/1996  . Influenza Vaccine  02/05/2011  . Tetanus/tdap  05/17/2014  . Colonoscopy  05/19/2014  . Pneumococcal Polysaccharide Vaccine Age 61 And Over  Completed    All answers were reviewed with the patient and necessary referrals were made:  Myrene Buddy  [name], DO   02/09/2011   History reviewed: allergies, current medications, past family history, past medical history, past social history, past surgical history and problem list  Review of Systems  Review of Systems  Constitutional: Negative for activity change, appetite change and fatigue.  HENT: Negative for hearing loss, congestion, tinnitus and ear discharge.  dentist q30m Eyes: Negative for visual disturbance (see optho q1y -- vision corrected to 20/20 with glasses).  Respiratory: Negative for cough, chest tightness and shortness of breath.   Cardiovascular: Negative for chest pain, palpitations and leg swelling.  Gastrointestinal: Negative for abdominal pain, diarrhea, constipation and abdominal distention.  Genitourinary: Negative for urgency, frequency, decreased urine volume and difficulty urinating.  Musculoskeletal: Negative for back pain, arthralgias and gait problem.  Skin: Negative for color change, pallor and rash.  Neurological: Negative for dizziness, light-headedness, numbness and headaches.  Hematological: Negative for adenopathy. Does not bruise/bleed easily.  Psychiatric/Behavioral: Negative for suicidal ideas, confusion, sleep disturbance, self-injury, dysphoric mood, decreased concentration and agitation.  Pt is able to read and write and can do all ADLs No risk for falling No abuse/ violence in home      Objective:     Vision by Snellen chart: right WJX:BJYNW, left GNF:AOZHY  Body mass index is 23.92 kg/(m^2). BP  154/98  Pulse 64  Temp(Src) 97.9 F (36.6 C) (Oral)  Ht 5\' 2"  (1.575 m)  Wt 130 lb 12.8 oz (59.33 kg)  BMI 23.92 kg/m2  SpO2 98%  BP 154/98  Pulse 64  Temp(Src) 97.9 F (36.6 C) (Oral)  Ht 5\' 2"  (1.575 m)  Wt 130 lb 12.8 oz (59.33 kg)  BMI 23.92 kg/m2  SpO2 98% General appearance: alert, cooperative, appears stated age and no distress Head: Normocephalic, without obvious abnormality, atraumatic Eyes: conjunctivae/corneas clear. PERRL, EOM's intact. Fundi benign. Ears: normal TM's and external ear canals both ears Nose: Nares normal. Septum midline. Mucosa normal. No drainage or sinus tenderness. Throat: lips, mucosa, and tongue normal; teeth and gums normal Neck: no adenopathy, no carotid bruit, no JVD, supple, symmetrical, trachea midline and thyroid not enlarged, symmetric, no tenderness/mass/nodules Back: symmetric, no curvature. ROM normal. No CVA tenderness. Lungs: clear to auscultation bilaterally Breasts: gyn Heart: regular rate and rhythm, S1, S2 normal, no murmur, click, rub or gallop Abdomen: soft, non-tender; bowel sounds normal; no masses,  no organomegaly Pelvic: gyn Extremities: extremities normal, atraumatic, no cyanosis or edema Pulses: 2+ and symmetric Skin: Skin color, texture, turgor normal. No rashes or lesions Lymph nodes: Cervical, supraclavicular, and axillary nodes normal. Neurologic: Alert and oriented X 3, normal strength and tone. Normal symmetric reflexes. Normal coordination and gait Psych-- no anxiety, no depression     Assessment:    CPE      Plan:  See AVS   During the course of the visit the patient was educated and counseled about appropriate screening and preventive services including:    Pneumococcal vaccine   Influenza vaccine  Td vaccine  Screening mammography  Screening Pap smear and pelvic exam   Bone densitometry screening  Colorectal cancer screening  Diabetes screening  Advanced directives: has an advanced  directive - a copy HAS NOT been provided.  Diet review for nutrition referral? Yes ____  Not Indicated _x___   Patient Instructions (the written plan) was given to the patient.  Medicare Attestation I have personally reviewed: The patient's medical and social history Their use of alcohol, tobacco or illicit drugs Their current medications and supplements The patient's functional ability including ADLs,fall  risks, home safety risks, cognitive, and hearing and visual impairment Diet and physical activities Evidence for depression or mood disorders  The patient's weight, height, BMI, and visual acuity have been recorded in the chart.  I have made referrals, counseling, and provided education to the patient based on review of the above and I have provided the patient with a written personalized care plan for preventive services.     Loreen Freud, DO   02/09/2011

## 2011-02-09 NOTE — Assessment & Plan Note (Signed)
Rare use diazepam

## 2011-02-09 NOTE — Assessment & Plan Note (Signed)
Diet controlled  Labs reviewed

## 2011-03-22 ENCOUNTER — Other Ambulatory Visit: Payer: Self-pay | Admitting: Family Medicine

## 2011-03-23 NOTE — Telephone Encounter (Signed)
Discussed with pharmacy and they did not receive the Rx that was faxed on 02/09/11---refaxed     KP

## 2011-08-09 ENCOUNTER — Other Ambulatory Visit: Payer: Medicare Other

## 2011-08-28 ENCOUNTER — Other Ambulatory Visit: Payer: Medicare Other

## 2011-09-01 ENCOUNTER — Emergency Department (INDEPENDENT_AMBULATORY_CARE_PROVIDER_SITE_OTHER)
Admission: EM | Admit: 2011-09-01 | Discharge: 2011-09-01 | Disposition: A | Discharge status: Home / Self Care | Payer: Medicare Other | Source: Home / Self Care | Attending: Emergency Medicine | Admitting: Emergency Medicine

## 2011-09-01 ENCOUNTER — Emergency Department (INDEPENDENT_AMBULATORY_CARE_PROVIDER_SITE_OTHER): Payer: Medicare Other

## 2011-09-01 ENCOUNTER — Encounter (HOSPITAL_COMMUNITY): Payer: Self-pay | Admitting: *Deleted

## 2011-09-01 DIAGNOSIS — S5290XA Unspecified fracture of unspecified forearm, initial encounter for closed fracture: Secondary | ICD-10-CM

## 2011-09-01 DIAGNOSIS — S59909A Unspecified injury of unspecified elbow, initial encounter: Secondary | ICD-10-CM | POA: Diagnosis not present

## 2011-09-01 MED ORDER — TRAMADOL HCL 50 MG PO TABS: 100.0000 mg | ORAL_TABLET | Freq: Three times a day (TID) | ORAL | Status: AC | PRN

## 2011-09-01 NOTE — Progress Notes (Signed)
Orthopedic Tech Progress Note Patient Details:  [name] 09/29/1934 [id number]  Type of Splint: Sugartong;Other (comment) (foam artm sling) Splint Location: left arm    Nikki Dom 09/01/2011, 8:14 PM

## 2011-09-01 NOTE — ED Provider Notes (Signed)
Chief Complaint  Patient presents with  . Fall  . Wrist Pain    History of Present Illness:   [name] is a 77 year old female. At 11:45 AM today, she fell and appear more subtle, landing on her left outstretched hand. There is a thin she's had pain in her left wrist, both the radius and ulna with some swelling. She is able to move her wrist fully without much pain. There is no numbness or tingling.  Review of Systems:  Other than noted above, the patient denies any of the following symptoms: Systemic:  No fevers, chills, sweats, or aches.  No fatigue or tiredness. Musculoskeletal:  No joint pain, arthritis, bursitis, swelling, back pain, or neck pain. Neurological:  No muscular weakness, paresthesias, headache, or trouble with speech or coordination.  No dizziness.   PMFSH:  Past medical history, family history, social history, meds, and allergies were reviewed.  Physical Exam:   Vital signs:  BP 191/89  Pulse 61  Temp(Src) 98.9 F (37.2 C) (Oral)  Resp 18  SpO2 98% Gen:  Alert and oriented times 3.  In no distress. Musculoskeletal: There was slight swelling over the distal radius, but no deformity. The wrists have full range of motion. No pain over the ulna. Otherwise, all joints had a full a ROM with no swelling, bruising or deformity.  No edema, pulses full. Extremities were warm and pink.  Capillary refill was brisk.  Skin:  Clear, warm and dry.  No rash. Neuro:  Alert and oriented times 3.  Muscle strength was normal.  Sensation was intact to light touch.   Radiology:  Dg Wrist Complete Left  09/01/2011  *RADIOLOGY REPORT*  Clinical Data: Fall  LEFT WRIST - COMPLETE 3+ VIEW  Comparison: None.  Findings: There is subtle lucency in the distal radius articular surface worrisome for a nondisplaced fracture.  This is best seen on the oblique view.  A small bone fragment projects over the dorsal distal radius which may be involved in the fracture.  Subtle ulnar styloid fracture is  also suspected. It is favored to be an old fracture.  Mild degenerative changes.  Mild osteopenia.  IMPRESSION: Findings are worrisome for a subtle intra-articular fracture of the distal radius.  Original Report Authenticated By: Donavan Burnet, M.D.   Course in Urgent Care Center:   She was put in a short arm sugar tong splint and a sling.  Assessment:  The encounter diagnosis was Fracture of radius.  Plan:   1.  The following meds were prescribed:   New Prescriptions   TRAMADOL (ULTRAM) 50 MG TABLET    Take 2 tablets (100 mg total) by mouth every 8 (eight) hours as needed for pain.   2.  The patient was instructed in symptomatic care, including rest and activity, elevation, application of ice and compression.  Appropriate handouts were given. 3.  The patient was told to return if becoming worse in any way, if no better in 3 or 4 days, and given some red flag symptoms that would indicate earlier return.   4.  The patient was told to follow up with Dr. Amanda Pea early next week.   Kathryn Likes, MD 09/01/11 2032

## 2011-09-01 NOTE — ED Notes (Signed)
Fell 1145am this morning - talking not paying attention missed step - pain and swelling left wrist - per pt applied ice

## 2011-09-01 NOTE — Discharge Instructions (Signed)
Cast or Splint Care Casts and splints support injured limbs and keep bones from moving while they heal.  HOME CARE  Keep the cast or splint uncovered during the drying period.   A plaster cast can take 24 to 48 hours to dry.   A fiberglass cast will dry in less than 1 hour.   Do not rest the cast on anything harder than a pillow for 24 hours.   Do not put weight on your injured limb. Do not put pressure on the cast. Wait for your doctor's approval.   Keep the cast or splint dry.   Cover the cast or splint with a plastic bag during baths or wet weather.   If you have a cast over your chest and belly (trunk), take sponge baths until the cast is taken off.   Keep your cast or splint clean. Wash a dirty cast with a damp cloth.   Do not put any objects under your cast or splint. Do not scratch the skin under the cast with an object.   Do not take out the padding from inside your cast.   Exercise your joints near the cast as told by your doctor.   Raise (elevate) your injured limb on 1 or 2 pillows for the first 1 to 3 days.  GET HELP RIGHT AWAY IF:  Your cast or splint cracks.   Your cast or splint is too tight or too loose.   You itch badly under the cast.   Your cast gets wet or has a soft spot.   You have a bad smell coming from the cast.   You get an object stuck under the cast.   Your skin around the cast becomes red or raw.   You have new or more pain after the cast is put on.   You have fluid leaking through the cast.   You cannot move your fingers or toes.   Your fingers or toes turn colors or are cool, painful, or puffy (swollen).   You have tingling or lose feeling (numbness) around the injured area.   You have pain or pressure under the cast.   You have trouble breathing or have shortness of breath.   You have chest pain.  MAKE SURE YOU:  Understand these instructions.   Will watch your condition.   Will get help right away if you are not doing  well or get worse.  Document Released: 08/23/2010 Document Revised: 04/12/2011 Document Reviewed: 08/23/2010 [employer] Patient Information 2012 [employer].Forearm Fracture The forearm is between your elbow and your wrist. It has two bones (ulna and radius). A fracture is a break in one or both of these bones. HOME CARE  Raise (elevate) your arm above the level of the heart.   Put ice on the injured area.   Put ice in a plastic bag.   Place a towel between the skin and the bag.   Leave the ice on for 15 to 20 minutes, 3 to 4 times a day.   If given a plaster or fiberglass cast:   Do not try to scratch the skin under the cast with sharp or pointed objects.   Check the skin around the cast every day. You may put lotion on any red or sore areas.   Keep the cast dry and clean.   If given a plaster splint:   Wear the splint as told.   You may loosen the elastic around the splint if the fingers   become numb, tingle, or turn cold or blue.   Do not put pressure on any part of the cast or splint. It may break. Rest the cast only on a pillow the first 24 hours until it is fully hardened.   The cast or splint can be protected during bathing with a plastic bag. Do not lower the cast or splint into water.   Only take medicine as told by your doctor.  GET HELP RIGHT AWAY IF:   The cast gets damaged or breaks.   You have pain or puffiness (swelling).   The skin or nails below the injury turn blue or gray, or feel cold or numb.   There is a bad smell, new stains, or fluid coming from under the cast.  MAKE SURE YOU:   Understand these instructions.   Will watch your condition.   Will get help right away if you are not doing well or get worse.  Document Released: 10/10/2007 Document Revised: 04/12/2011 Document Reviewed: 10/10/2007 [employer] Patient Information 2012 [employer]. 

## 2011-09-03 DIAGNOSIS — S52509A Unspecified fracture of the lower end of unspecified radius, initial encounter for closed fracture: Secondary | ICD-10-CM | POA: Diagnosis not present

## 2011-09-03 DIAGNOSIS — S52609A Unspecified fracture of lower end of unspecified ulna, initial encounter for closed fracture: Secondary | ICD-10-CM | POA: Diagnosis not present

## 2011-09-06 ENCOUNTER — Other Ambulatory Visit: Payer: Medicare Other

## 2011-09-27 ENCOUNTER — Other Ambulatory Visit: Payer: Medicare Other

## 2011-10-02 DIAGNOSIS — S52599A Other fractures of lower end of unspecified radius, initial encounter for closed fracture: Secondary | ICD-10-CM | POA: Diagnosis not present

## 2011-10-09 DIAGNOSIS — S52599A Other fractures of lower end of unspecified radius, initial encounter for closed fracture: Secondary | ICD-10-CM | POA: Diagnosis not present

## 2011-10-10 ENCOUNTER — Other Ambulatory Visit (INDEPENDENT_AMBULATORY_CARE_PROVIDER_SITE_OTHER): Payer: Medicare Other

## 2011-10-10 DIAGNOSIS — E785 Hyperlipidemia, unspecified: Secondary | ICD-10-CM

## 2011-10-10 DIAGNOSIS — IMO0001 Reserved for inherently not codable concepts without codable children: Secondary | ICD-10-CM

## 2011-10-10 DIAGNOSIS — R7989 Other specified abnormal findings of blood chemistry: Secondary | ICD-10-CM

## 2011-10-10 LAB — CBC WITH DIFFERENTIAL/PLATELET
Basophils Absolute: 0.1 10*3/uL (ref 0.0–0.1)
Eosinophils Absolute: 0.1 10*3/uL (ref 0.0–0.7)
Lymphocytes Relative: 46.3 % — ABNORMAL HIGH (ref 12.0–46.0)
MCHC: 33.2 g/dL (ref 30.0–36.0)
MCV: 85.9 fl (ref 78.0–100.0)
Monocytes Absolute: 0.5 10*3/uL (ref 0.1–1.0)
Neutrophils Relative %: 39.3 % — ABNORMAL LOW (ref 43.0–77.0)
Platelets: 168 10*3/uL (ref 150.0–400.0)
RDW: 13.4 % (ref 11.5–14.6)

## 2011-10-10 LAB — BASIC METABOLIC PANEL
BUN: 25 mg/dL — ABNORMAL HIGH (ref 6–23)
CO2: 28 mEq/L (ref 19–32)
Calcium: 9.2 mg/dL (ref 8.4–10.5)
Creatinine, Ser: 0.8 mg/dL (ref 0.4–1.2)
GFR: 71.23 mL/min (ref >60.00)
Glucose, Bld: 80 mg/dL (ref 70–99)
Sodium: 142 mEq/L (ref 135–145)

## 2011-10-10 LAB — HEPATIC FUNCTION PANEL
ALT: 20 U/L (ref 0–35)
AST: 22 U/L (ref 0–37)
Alkaline Phosphatase: 66 U/L (ref 39–117)
Bilirubin, Direct: 0.1 mg/dL (ref 0.0–0.3)
Total Bilirubin: 1.2 mg/dL (ref 0.3–1.2)

## 2011-10-10 LAB — LIPID PANEL: HDL: 73.1 mg/dL (ref >39.00)

## 2011-10-11 DIAGNOSIS — S52609A Unspecified fracture of lower end of unspecified ulna, initial encounter for closed fracture: Secondary | ICD-10-CM | POA: Diagnosis not present

## 2011-10-11 DIAGNOSIS — S52509A Unspecified fracture of the lower end of unspecified radius, initial encounter for closed fracture: Secondary | ICD-10-CM | POA: Diagnosis not present

## 2011-10-16 DIAGNOSIS — S52599A Other fractures of lower end of unspecified radius, initial encounter for closed fracture: Secondary | ICD-10-CM | POA: Diagnosis not present

## 2011-10-18 DIAGNOSIS — S52599A Other fractures of lower end of unspecified radius, initial encounter for closed fracture: Secondary | ICD-10-CM | POA: Diagnosis not present

## 2011-10-23 DIAGNOSIS — S52609A Unspecified fracture of lower end of unspecified ulna, initial encounter for closed fracture: Secondary | ICD-10-CM | POA: Diagnosis not present

## 2011-10-23 DIAGNOSIS — S52509A Unspecified fracture of the lower end of unspecified radius, initial encounter for closed fracture: Secondary | ICD-10-CM | POA: Diagnosis not present

## 2011-10-25 DIAGNOSIS — S52609A Unspecified fracture of lower end of unspecified ulna, initial encounter for closed fracture: Secondary | ICD-10-CM | POA: Diagnosis not present

## 2011-10-25 DIAGNOSIS — S52509A Unspecified fracture of the lower end of unspecified radius, initial encounter for closed fracture: Secondary | ICD-10-CM | POA: Diagnosis not present

## 2011-10-30 DIAGNOSIS — S52599A Other fractures of lower end of unspecified radius, initial encounter for closed fracture: Secondary | ICD-10-CM | POA: Diagnosis not present

## 2011-11-13 ENCOUNTER — Encounter: Payer: Medicare Other | Admitting: Family Medicine

## 2011-11-27 ENCOUNTER — Encounter: Payer: Medicare Other | Admitting: Family Medicine

## 2011-12-26 ENCOUNTER — Other Ambulatory Visit: Payer: Self-pay | Admitting: Family Medicine

## 2011-12-26 MED ORDER — DIAZEPAM 5 MG PO TABS: 2.5000 mg | ORAL_TABLET | Freq: Every day | ORAL | Status: DC

## 2011-12-26 NOTE — Telephone Encounter (Signed)
Last seen 02/09/11 and filled 03/22/11 #30 with 2 refills. Please advise     KP

## 2011-12-26 NOTE — Telephone Encounter (Signed)
DIAZEPAM 5MG  Tablet

## 2011-12-26 NOTE — Telephone Encounter (Signed)
Refill x1 

## 2012-02-12 ENCOUNTER — Encounter: Payer: Medicare Other | Admitting: Family Medicine

## 2012-02-22 DIAGNOSIS — E119 Type 2 diabetes mellitus without complications: Secondary | ICD-10-CM | POA: Diagnosis not present

## 2012-03-04 ENCOUNTER — Other Ambulatory Visit: Payer: Self-pay | Admitting: Family Medicine

## 2012-03-06 ENCOUNTER — Encounter: Payer: Self-pay | Admitting: Family Medicine

## 2012-03-06 ENCOUNTER — Ambulatory Visit (INDEPENDENT_AMBULATORY_CARE_PROVIDER_SITE_OTHER): Payer: Medicare Other | Admitting: Family Medicine

## 2012-03-06 VITALS — BP 160/88 | HR 67 | Temp 97.6°F | Wt 129.2 lb

## 2012-03-06 DIAGNOSIS — I10 Essential (primary) hypertension: Secondary | ICD-10-CM | POA: Diagnosis not present

## 2012-03-06 DIAGNOSIS — Z23 Encounter for immunization: Secondary | ICD-10-CM | POA: Diagnosis not present

## 2012-03-06 MED ORDER — LOSARTAN POTASSIUM 50 MG PO TABS: 50.0000 mg | ORAL_TABLET | Freq: Every day | ORAL | Status: DC

## 2012-03-06 NOTE — Patient Instructions (Signed)

## 2012-03-06 NOTE — Progress Notes (Signed)
  Subjective:    Patient here for follow-up of elevated blood pressure.  She is not exercising and is adherent to a low-salt diet.  Blood pressure is not well controlled at home. Cardiac symptoms: none. Patient denies: chest pain, chest pressure/discomfort, claudication, dyspnea, exertional chest pressure/discomfort, fatigue, irregular heart beat, lower extremity edema, near-syncope, orthopnea, palpitations, paroxysmal nocturnal dyspnea, syncope and tachypnea. Cardiovascular risk factors: advanced age (older than 78 for men, 17 for women), hypertension and sedentary lifestyle. Use of agents associated with hypertension: none. History of target organ damage: none.  The following portions of the patient's history were reviewed and updated as appropriate: allergies, current medications, past family history, past medical history, past social history, past surgical history and problem list.  Review of Systems Pertinent items are noted in HPI.     Objective:    BP 160/88  Pulse 67  Temp 97.6 F (36.4 C) (Oral)  Wt 129 lb 3.2 oz (58.605 kg)  SpO2 97% General appearance: alert, cooperative, appears stated age and no distress Lungs: clear to auscultation bilaterally Heart: S1, S2 normal Extremities: extremities normal, atraumatic, no cyanosis or edema    Assessment:    Hypertension, stage 1 . Evidence of target organ damage: none.    Plan:    Medication: discontinue lisinopril  and begin losartan. Dietary sodium restriction. Regular aerobic exercise. Check blood pressures 2-3 times weekly and record. Follow up: 2 weeks and as needed.

## 2012-03-24 ENCOUNTER — Encounter: Payer: Self-pay | Admitting: Family Medicine

## 2012-03-24 ENCOUNTER — Ambulatory Visit (INDEPENDENT_AMBULATORY_CARE_PROVIDER_SITE_OTHER): Payer: Medicare Other | Admitting: Family Medicine

## 2012-03-24 ENCOUNTER — Other Ambulatory Visit: Payer: Self-pay | Admitting: Family Medicine

## 2012-03-24 VITALS — BP 158/88 | HR 67 | Temp 98.0°F | Ht 61.0 in | Wt 130.4 lb

## 2012-03-24 DIAGNOSIS — I10 Essential (primary) hypertension: Secondary | ICD-10-CM | POA: Diagnosis not present

## 2012-03-24 DIAGNOSIS — E119 Type 2 diabetes mellitus without complications: Secondary | ICD-10-CM | POA: Diagnosis not present

## 2012-03-24 DIAGNOSIS — E876 Hypokalemia: Secondary | ICD-10-CM

## 2012-03-24 DIAGNOSIS — E785 Hyperlipidemia, unspecified: Secondary | ICD-10-CM

## 2012-03-24 DIAGNOSIS — Z0000 Encounter for general adult medical examination without abnormal findings: Secondary | ICD-10-CM

## 2012-03-24 LAB — BASIC METABOLIC PANEL
Calcium: 9.2 mg/dL (ref 8.4–10.5)
Chloride: 107 mEq/L (ref 96–112)
Creatinine, Ser: 1 mg/dL (ref 0.4–1.2)
Potassium: 3.4 mEq/L — ABNORMAL LOW (ref 3.5–5.1)

## 2012-03-24 MED ORDER — ATENOLOL 50 MG PO TABS: 50.0000 mg | ORAL_TABLET | Freq: Every day | ORAL | Status: DC

## 2012-03-24 MED ORDER — SIMVASTATIN 20 MG PO TABS: 20.0000 mg | ORAL_TABLET | Freq: Every day | ORAL | Status: DC

## 2012-03-24 MED ORDER — LOSARTAN POTASSIUM 100 MG PO TABS: 100.0000 mg | ORAL_TABLET | Freq: Every day | ORAL | Status: DC

## 2012-03-24 MED ORDER — TRIAMTERENE-HCTZ 75-50 MG PO TABS: ORAL_TABLET | ORAL | Status: DC

## 2012-03-24 MED ORDER — DIAZEPAM 5 MG PO TABS: 5.0000 mg | ORAL_TABLET | Freq: Every day | ORAL | Status: DC

## 2012-03-24 MED ORDER — GLUCOSE BLOOD VI STRP: ORAL_STRIP | Status: DC

## 2012-03-24 NOTE — Assessment & Plan Note (Signed)
Check labs con't meds 

## 2012-03-24 NOTE — Patient Instructions (Addendum)
Preventive Care for Adults, Female A healthy lifestyle and preventive care can promote health and wellness. Preventive health guidelines for women include the following key practices.  A routine yearly physical is a good way to check with your caregiver about your health and preventive screening. It is a chance to share any concerns and updates on your health, and to receive a thorough exam.  Visit your dentist for a routine exam and preventive care every 6 months. Brush your teeth twice a day and floss once a day. Good oral hygiene prevents tooth decay and gum disease.  The frequency of eye exams is based on your age, health, family medical history, use of contact lenses, and other factors. Follow your caregiver's recommendations for frequency of eye exams.  Eat a healthy diet. Foods like vegetables, fruits, whole grains, low-fat dairy products, and lean protein foods contain the nutrients you need without too many calories. Decrease your intake of foods high in solid fats, added sugars, and salt. Eat the right amount of calories for you.Get information about a proper diet from your caregiver, if necessary.  Regular physical exercise is one of the most important things you can do for your health. Most adults should get at least 150 minutes of moderate-intensity exercise (any activity that increases your heart rate and causes you to sweat) each week. In addition, most adults need muscle-strengthening exercises on 2 or more days a week.  Maintain a healthy weight. The body mass index (BMI) is a screening tool to identify possible weight problems. It provides an estimate of body fat based on height and weight. Your caregiver can help determine your BMI, and can help you achieve or maintain a healthy weight.For adults 20 years and older:  A BMI below 18.5 is considered underweight.  A BMI of 18.5 to 24.9 is normal.  A BMI of 25 to 29.9 is considered overweight.  A BMI of 30 and above is  considered obese.  Maintain normal blood lipids and cholesterol levels by exercising and minimizing your intake of saturated fat. Eat a balanced diet with plenty of fruit and vegetables. Blood tests for lipids and cholesterol should begin at age 20 and be repeated every 5 years. If your lipid or cholesterol levels are high, you are over 50, or you are at high risk for heart disease, you may need your cholesterol levels checked more frequently.Ongoing high lipid and cholesterol levels should be treated with medicines if diet and exercise are not effective.  If you smoke, find out from your caregiver how to quit. If you do not use tobacco, do not start.  If you are pregnant, do not drink alcohol. If you are breastfeeding, be very cautious about drinking alcohol. If you are not pregnant and choose to drink alcohol, do not exceed 1 drink per day. One drink is considered to be 12 ounces (355 mL) of beer, 5 ounces (148 mL) of wine, or 1.5 ounces (44 mL) of liquor.  Avoid use of street drugs. Do not share needles with anyone. Ask for help if you need support or instructions about stopping the use of drugs.  High blood pressure causes heart disease and increases the risk of stroke. Your blood pressure should be checked at least every 1 to 2 years. Ongoing high blood pressure should be treated with medicines if weight loss and exercise are not effective.  If you are 55 to 75 years old, ask your caregiver if you should take aspirin to prevent strokes.  Diabetes   screening involves taking a blood sample to check your fasting blood sugar level. This should be done once every 3 years, after age 45, if you are within normal weight and without risk factors for diabetes. Testing should be considered at a younger age or be carried out more frequently if you are overweight and have at least 1 risk factor for diabetes.  Breast cancer screening is essential preventive care for women. You should practice "breast  self-awareness." This means understanding the normal appearance and feel of your breasts and may include breast self-examination. Any changes detected, no matter how small, should be reported to a caregiver. Women in their 20s and 30s should have a clinical breast exam (CBE) by a caregiver as part of a regular health exam every 1 to 3 years. After age 40, women should have a CBE every year. Starting at age 40, women should consider having a mammography (breast X-ray test) every year. Women who have a family history of breast cancer should talk to their caregiver about genetic screening. Women at a high risk of breast cancer should talk to their caregivers about having magnetic resonance imaging (MRI) and a mammography every year.  The Pap test is a screening test for cervical cancer. A Pap test can show cell changes on the cervix that might become cervical cancer if left untreated. A Pap test is a procedure in which cells are obtained and examined from the lower end of the uterus (cervix).  Women should have a Pap test starting at age 21.  Between ages 21 and 29, Pap tests should be repeated every 2 years.  Beginning at age 30, you should have a Pap test every 3 years as long as the past 3 Pap tests have been normal.  Some women have medical problems that increase the chance of getting cervical cancer. Talk to your caregiver about these problems. It is especially important to talk to your caregiver if a new problem develops soon after your last Pap test. In these cases, your caregiver may recommend more frequent screening and Pap tests.  The above recommendations are the same for women who have or have not gotten the vaccine for human papillomavirus (HPV).  If you had a hysterectomy for a problem that was not cancer or a condition that could lead to cancer, then you no longer need Pap tests. Even if you no longer need a Pap test, a regular exam is a good idea to make sure no other problems are  starting.  If you are between ages 65 and 70, and you have had normal Pap tests going back 10 years, you no longer need Pap tests. Even if you no longer need a Pap test, a regular exam is a good idea to make sure no other problems are starting.  If you have had past treatment for cervical cancer or a condition that could lead to cancer, you need Pap tests and screening for cancer for at least 20 years after your treatment.  If Pap tests have been discontinued, risk factors (such as a new sexual partner) need to be reassessed to determine if screening should be resumed.  The HPV test is an additional test that may be used for cervical cancer screening. The HPV test looks for the virus that can cause the cell changes on the cervix. The cells collected during the Pap test can be tested for HPV. The HPV test could be used to screen women aged 30 years and older, and should   be used in women of any age who have unclear Pap test results. After the age of 30, women should have HPV testing at the same frequency as a Pap test.  Colorectal cancer can be detected and often prevented. Most routine colorectal cancer screening begins at the age of 50 and continues through age 75. However, your caregiver may recommend screening at an earlier age if you have risk factors for colon cancer. On a yearly basis, your caregiver may provide home test kits to check for hidden blood in the stool. Use of a small camera at the end of a tube, to directly examine the colon (sigmoidoscopy or colonoscopy), can detect the earliest forms of colorectal cancer. Talk to your caregiver about this at age 50, when routine screening begins. Direct examination of the colon should be repeated every 5 to 10 years through age 75, unless early forms of pre-cancerous polyps or small growths are found.  Hepatitis C blood testing is recommended for all people born from 1945 through 1965 and any individual with known risks for hepatitis C.  Practice  safe sex. Use condoms and avoid high-risk sexual practices to reduce the spread of sexually transmitted infections (STIs). STIs include gonorrhea, chlamydia, syphilis, trichomonas, herpes, HPV, and human immunodeficiency virus (HIV). Herpes, HIV, and HPV are viral illnesses that have no cure. They can result in disability, cancer, and death. Sexually active women aged 25 and younger should be checked for chlamydia. Older women with new or multiple partners should also be tested for chlamydia. Testing for other STIs is recommended if you are sexually active and at increased risk.  Osteoporosis is a disease in which the bones lose minerals and strength with aging. This can result in serious bone fractures. The risk of osteoporosis can be identified using a bone density scan. Women ages 65 and over and women at risk for fractures or osteoporosis should discuss screening with their caregivers. Ask your caregiver whether you should take a calcium supplement or vitamin D to reduce the rate of osteoporosis.  Menopause can be associated with physical symptoms and risks. Hormone replacement therapy is available to decrease symptoms and risks. You should talk to your caregiver about whether hormone replacement therapy is right for you.  Use sunscreen with sun protection factor (SPF) of 30 or more. Apply sunscreen liberally and repeatedly throughout the day. You should seek shade when your shadow is shorter than you. Protect yourself by wearing long sleeves, pants, a wide-brimmed hat, and sunglasses year round, whenever you are outdoors.  Once a month, do a whole body skin exam, using a mirror to look at the skin on your back. Notify your caregiver of new moles, moles that have irregular borders, moles that are larger than a pencil eraser, or moles that have changed in shape or color.  Stay current with required immunizations.  Influenza. You need a dose every fall (or winter). The composition of the flu vaccine  changes each year, so being vaccinated once is not enough.  Pneumococcal polysaccharide. You need 1 to 2 doses if you smoke cigarettes or if you have certain chronic medical conditions. You need 1 dose at age 65 (or older) if you have never been vaccinated.  Tetanus, diphtheria, pertussis (Tdap, Td). Get 1 dose of Tdap vaccine if you are younger than age 65, are over 65 and have contact with an infant, are a healthcare worker, are pregnant, or simply want to be protected from whooping cough. After that, you need a Td   booster dose every 10 years. Consult your caregiver if you have not had at least 3 tetanus and diphtheria-containing shots sometime in your life or have a deep or dirty wound.  HPV. You need this vaccine if you are a woman age 26 or younger. The vaccine is given in 3 doses over 6 months.  Measles, mumps, rubella (MMR). You need at least 1 dose of MMR if you were born in 1957 or later. You may also need a second dose.  Meningococcal. If you are age 19 to 21 and a first-year college student living in a residence hall, or have one of several medical conditions, you need to get vaccinated against meningococcal disease. You may also need additional booster doses.  Zoster (shingles). If you are age 60 or older, you should get this vaccine.  Varicella (chickenpox). If you have never had chickenpox or you were vaccinated but received only 1 dose, talk to your caregiver to find out if you need this vaccine.  Hepatitis A. You need this vaccine if you have a specific risk factor for hepatitis A virus infection or you simply wish to be protected from this disease. The vaccine is usually given as 2 doses, 6 to 18 months apart.  Hepatitis B. You need this vaccine if you have a specific risk factor for hepatitis B virus infection or you simply wish to be protected from this disease. The vaccine is given in 3 doses, usually over 6 months. Preventive Services / Frequency Ages 19 to 39  Blood  pressure check.** / Every 1 to 2 years.  Lipid and cholesterol check.** / Every 5 years beginning at age 20.  Clinical breast exam.** / Every 3 years for women in their 20s and 30s.  Pap test.** / Every 2 years from ages 21 through 29. Every 3 years starting at age 30 through age 65 or 70 with a history of 3 consecutive normal Pap tests.  HPV screening.** / Every 3 years from ages 30 through ages 65 to 70 with a history of 3 consecutive normal Pap tests.  Hepatitis C blood test.** / For any individual with known risks for hepatitis C.  Skin self-exam. / Monthly.  Influenza immunization.** / Every year.  Pneumococcal polysaccharide immunization.** / 1 to 2 doses if you smoke cigarettes or if you have certain chronic medical conditions.  Tetanus, diphtheria, pertussis (Tdap, Td) immunization. / A one-time dose of Tdap vaccine. After that, you need a Td booster dose every 10 years.  HPV immunization. / 3 doses over 6 months, if you are 26 and younger.  Measles, mumps, rubella (MMR) immunization. / You need at least 1 dose of MMR if you were born in 1957 or later. You may also need a second dose.  Meningococcal immunization. / 1 dose if you are age 19 to 21 and a first-year college student living in a residence hall, or have one of several medical conditions, you need to get vaccinated against meningococcal disease. You may also need additional booster doses.  Varicella immunization.** / Consult your caregiver.  Hepatitis A immunization.** / Consult your caregiver. 2 doses, 6 to 18 months apart.  Hepatitis B immunization.** / Consult your caregiver. 3 doses usually over 6 months. Ages 40 to 64  Blood pressure check.** / Every 1 to 2 years.  Lipid and cholesterol check.** / Every 5 years beginning at age 20.  Clinical breast exam.** / Every year after age 40.  Mammogram.** / Every year beginning at age 40   and continuing for as long as you are in good health. Consult with your  caregiver.  Pap test.** / Every 3 years starting at age 30 through age 65 or 70 with a history of 3 consecutive normal Pap tests.  HPV screening.** / Every 3 years from ages 30 through ages 65 to 70 with a history of 3 consecutive normal Pap tests.  Fecal occult blood test (FOBT) of stool. / Every year beginning at age 50 and continuing until age 75. You may not need to do this test if you get a colonoscopy every 10 years.  Flexible sigmoidoscopy or colonoscopy.** / Every 5 years for a flexible sigmoidoscopy or every 10 years for a colonoscopy beginning at age 50 and continuing until age 75.  Hepatitis C blood test.** / For all people born from 1945 through 1965 and any individual with known risks for hepatitis C.  Skin self-exam. / Monthly.  Influenza immunization.** / Every year.  Pneumococcal polysaccharide immunization.** / 1 to 2 doses if you smoke cigarettes or if you have certain chronic medical conditions.  Tetanus, diphtheria, pertussis (Tdap, Td) immunization.** / A one-time dose of Tdap vaccine. After that, you need a Td booster dose every 10 years.  Measles, mumps, rubella (MMR) immunization. / You need at least 1 dose of MMR if you were born in 1957 or later. You may also need a second dose.  Varicella immunization.** / Consult your caregiver.  Meningococcal immunization.** / Consult your caregiver.  Hepatitis A immunization.** / Consult your caregiver. 2 doses, 6 to 18 months apart.  Hepatitis B immunization.** / Consult your caregiver. 3 doses, usually over 6 months. Ages 65 and over  Blood pressure check.** / Every 1 to 2 years.  Lipid and cholesterol check.** / Every 5 years beginning at age 20.  Clinical breast exam.** / Every year after age 40.  Mammogram.** / Every year beginning at age 40 and continuing for as long as you are in good health. Consult with your caregiver.  Pap test.** / Every 3 years starting at age 30 through age 65 or 70 with a 3  consecutive normal Pap tests. Testing can be stopped between 65 and 70 with 3 consecutive normal Pap tests and no abnormal Pap or HPV tests in the past 10 years.  HPV screening.** / Every 3 years from ages 30 through ages 65 or 70 with a history of 3 consecutive normal Pap tests. Testing can be stopped between 65 and 70 with 3 consecutive normal Pap tests and no abnormal Pap or HPV tests in the past 10 years.  Fecal occult blood test (FOBT) of stool. / Every year beginning at age 50 and continuing until age 75. You may not need to do this test if you get a colonoscopy every 10 years.  Flexible sigmoidoscopy or colonoscopy.** / Every 5 years for a flexible sigmoidoscopy or every 10 years for a colonoscopy beginning at age 50 and continuing until age 75.  Hepatitis C blood test.** / For all people born from 1945 through 1965 and any individual with known risks for hepatitis C.  Osteoporosis screening.** / A one-time screening for women ages 65 and over and women at risk for fractures or osteoporosis.  Skin self-exam. / Monthly.  Influenza immunization.** / Every year.  Pneumococcal polysaccharide immunization.** / 1 dose at age 65 (or older) if you have never been vaccinated.  Tetanus, diphtheria, pertussis (Tdap, Td) immunization. / A one-time dose of Tdap vaccine if you are over   65 and have contact with an infant, are a healthcare worker, or simply want to be protected from whooping cough. After that, you need a Td booster dose every 10 years.  Varicella immunization.** / Consult your caregiver.  Meningococcal immunization.** / Consult your caregiver.  Hepatitis A immunization.** / Consult your caregiver. 2 doses, 6 to 18 months apart.  Hepatitis B immunization.** / Check with your caregiver. 3 doses, usually over 6 months. ** Family history and personal history of risk and conditions may change your caregiver's recommendations. Document Released: 06/19/2001 Document Revised: 07/16/2011  Document Reviewed: 09/18/2010 [employer] Patient Information 2013 [employer].  

## 2012-03-24 NOTE — Assessment & Plan Note (Signed)
Check labs Con' meds 

## 2012-03-24 NOTE — Assessment & Plan Note (Signed)
Stable con't meds 

## 2012-03-24 NOTE — Progress Notes (Signed)
Subjective:    [name] is a 77 y.o. female who presents for Medicare Annual/Subsequent preventive examination.  Preventive Screening-Counseling & Management  Tobacco History  Smoking status  . Never Smoker   Smokeless tobacco  . Never Used     Problems Prior to Visit 1.   Current Problems (verified) Patient Active Problem List  Diagnosis  . DIABETES MELLITUS, TYPE II  . HYPERLIPIDEMIA  . HYPERTENSION  . ABSCESS, RECTUM  . FATTY LIVER DISEASE  . LIVER FUNCTION TESTS, ABNORMAL  . FOOT INJURY  . THYROID NODULE, HX OF  . TONSILLECTOMY AND ADENOIDECTOMY, HX OF  . HEMORRHOIDECTOMY, HX OF  . Hypokalemia  . Situational mixed anxiety and depressive disorder    Medications Prior to Visit Current Outpatient Prescriptions on File Prior to Visit  Medication Sig Dispense Refill  . aspirin 81 MG tablet Take 81 mg by mouth daily.        . Lancets (FREESTYLE) lancets 1 each by Other route as needed. Use as instructed       . naproxen sodium (ANAPROX) 220 MG tablet Take 220 mg by mouth 2 (two) times daily with a meal.      . potassium chloride (KLOR-CON) 20 MEQ packet Take 20 mEq by mouth daily.  90 tablet  3  . [DISCONTINUED] atenolol (TENORMIN) 50 MG tablet TAKE ONE TABLET (50 MG TOTAL) BY MOUTH ONCE DAILY  90 tablet  0  . [DISCONTINUED] diazepam (VALIUM) 5 MG tablet Take 0.5 tablets (2.5 mg total) by mouth daily.  30 tablet  0  . [DISCONTINUED] triamterene-hydrochlorothiazide (MAXZIDE) 75-50 MG per tablet TAKE ONE-HALF TABLET ORALLY ONCE DAILY  45 tablet  0  . zoster vaccine live, PF, (ZOSTAVAX) 82956 UNT/0.65ML injection Inject 19,400 Units into the skin once.  1 vial  0    Current Medications (verified) Current Outpatient Prescriptions  Medication Sig Dispense Refill  . aspirin 81 MG tablet Take 81 mg by mouth daily.        Marland Kitchen atenolol (TENORMIN) 50 MG tablet Take 1 tablet (50 mg total) by mouth daily.  90 tablet  3  . diazepam (VALIUM) 5 MG tablet Take 1 tablet (5  mg total) by mouth daily.  30 tablet  2  . glucose blood (FREESTYLE TEST STRIPS) test strip Use as instructed  100 each  0  . Lancets (FREESTYLE) lancets 1 each by Other route as needed. Use as instructed       . naproxen sodium (ANAPROX) 220 MG tablet Take 220 mg by mouth 2 (two) times daily with a meal.      . potassium chloride (KLOR-CON) 20 MEQ packet Take 20 mEq by mouth daily.  90 tablet  3  . triamterene-hydrochlorothiazide (MAXZIDE) 75-50 MG per tablet 1/2 tab po qd  45 tablet  3  . [DISCONTINUED] atenolol (TENORMIN) 50 MG tablet TAKE ONE TABLET (50 MG TOTAL) BY MOUTH ONCE DAILY  90 tablet  0  . [DISCONTINUED] diazepam (VALIUM) 5 MG tablet Take 0.5 tablets (2.5 mg total) by mouth daily.  30 tablet  0  . [DISCONTINUED] triamterene-hydrochlorothiazide (MAXZIDE) 75-50 MG per tablet TAKE ONE-HALF TABLET ORALLY ONCE DAILY  45 tablet  0  . KLOR-CON M20 20 MEQ tablet       . losartan (COZAAR) 100 MG tablet Take 1 tablet (100 mg total) by mouth daily.  90 tablet  3  . simvastatin (ZOCOR) 20 MG tablet Take 1 tablet (20 mg total) by mouth at bedtime.  90 tablet  3  . zoster vaccine live, PF, (ZOSTAVAX) 16109 UNT/0.65ML injection Inject 19,400 Units into the skin once.  1 vial  0     Allergies (verified) Sulfonamide derivatives   PAST HISTORY  Family History Family History  Problem Relation Age of Onset  . Diabetes    . Hypertension    . Other Brother     Rhabdo secondary to Statin  . Diabetes Brother   . Hypertension Brother   . Diabetes Mother   . Hypertension Mother   . Hypertension Sister     Social History History  Substance Use Topics  . Smoking status: Never Smoker   . Smokeless tobacco: Never Used  . Alcohol Use: No     Are there smokers in your home (other than you)? No  Risk Factors Current exercise habits: Home exercise routine includes walking.  Dietary issues discussed: na   Cardiac risk factors: advanced age (older than 65 for men, 62 for women), diabetes  mellitus, dyslipidemia and hypertension.  Depression Screen (Note: if answer to either of the following is "Yes", a more complete depression screening is indicated)   Over the past two weeks, have you felt down, depressed or hopeless? No  Over the past two weeks, have you felt little interest or pleasure in doing things? No  Have you lost interest or pleasure in daily life? No  Do you often feel hopeless? No  Do you cry easily over simple problems? No  Activities of Daily Living In your present state of health, do you have any difficulty performing the following activities?:  Driving? No Managing money?  No Feeding yourself? No Getting from bed to chair? No Climbing a flight of stairs? No Preparing food and eating?: No Bathing or showering? No Getting dressed: No Getting to the toilet? No Using the toilet:No Moving around from place to place: No In the past year have you fallen or had a near fall?:No   Are you sexually active?  No  Do you have more than one partner?  No  Hearing Difficulties: No Do you often ask people to speak up or repeat themselves? No Do you experience ringing or noises in your ears? No Do you have difficulty understanding soft or whispered voices? No   Do you feel that you have a problem with memory? No  Do you often misplace items? No  Do you feel safe at home?  No  Cognitive Testing  Alert? Yes  Normal Appearance?Yes  Oriented to person? Yes  Place? Yes   Time? Yes  Recall of three objects?  Yes  Can perform simple calculations? Yes  Displays appropriate judgment?Yes  Can read the correct time from a watch face?Yes   Advanced Directives have been discussed with the patient? Yes  List the Names of Other Physician/Practitioners you currently use: 1.  opth-- [facility] 2  Dentist-- [facility] 3.    Indicate any recent Medical Services you may have received from other than [facility] providers in the past year (date may be approximate).  Immunization  History  Administered Date(s) Administered  . Influenza Split 02/09/2011, 03/06/2012  . Influenza Whole 02/24/2008  . Pneumococcal Polysaccharide 05/17/2004  . Td 05/17/2004    Screening Tests Health Maintenance  Topic Date Due  . Foot Exam  10/02/1944  . Zostavax  10/01/1996  . Pneumococcal Polysaccharide Vaccine (#2) 05/17/2009  . Urine Microalbumin  01/23/2012  . Hemoglobin A1c  04/10/2012  . Influenza Vaccine  01/05/2013  . Ophthalmology Exam  02/21/2013  .  Tetanus/tdap  05/17/2014  . Colonoscopy  05/19/2014  . Pneumococcal Polysaccharide Vaccine Age 23 And Over  Completed    All answers were reviewed with the patient and necessary referrals were made:  Loreen Freud, DO   03/24/2012   History reviewed:  She  has a past medical history of Diabetes mellitus; Hypertension; Hyperlipidemia; Thyroid nodule; Abscess, rectum; and Fatty liver disease, nonalcoholic. She  does not have any pertinent problems on file. She  has past surgical history that includes Abdominal hysterectomy; Anal fissure repair; Tonsillectomy and adenoidectomy; and Hemorrhoid surgery. Her family history includes Diabetes in her brother, mother, and unspecified family member; Hypertension in her brother, mother, sister, and unspecified family member; and Other in her brother. She  reports that she has never smoked. She has never used smokeless tobacco. She reports that she does not drink alcohol or use illicit drugs. She has a current medication list which includes the following prescription(s): aspirin, atenolol, diazepam, glucose blood, freestyle, naproxen sodium, potassium chloride, triamterene-hydrochlorothiazide, klor-con m20, losartan, simvastatin, and zoster vaccine live (pf). Current Outpatient Prescriptions on File Prior to Visit  Medication Sig Dispense Refill  . aspirin 81 MG tablet Take 81 mg by mouth daily.        . Lancets (FREESTYLE) lancets 1 each by Other route as needed. Use as instructed       .  naproxen sodium (ANAPROX) 220 MG tablet Take 220 mg by mouth 2 (two) times daily with a meal.      . potassium chloride (KLOR-CON) 20 MEQ packet Take 20 mEq by mouth daily.  90 tablet  3  . [DISCONTINUED] atenolol (TENORMIN) 50 MG tablet TAKE ONE TABLET (50 MG TOTAL) BY MOUTH ONCE DAILY  90 tablet  0  . [DISCONTINUED] diazepam (VALIUM) 5 MG tablet Take 0.5 tablets (2.5 mg total) by mouth daily.  30 tablet  0  . [DISCONTINUED] triamterene-hydrochlorothiazide (MAXZIDE) 75-50 MG per tablet TAKE ONE-HALF TABLET ORALLY ONCE DAILY  45 tablet  0  . zoster vaccine live, PF, (ZOSTAVAX) 16109 UNT/0.65ML injection Inject 19,400 Units into the skin once.  1 vial  0   She is allergic to sulfonamide derivatives.  Review of Systems  Review of Systems  Constitutional: Negative for activity change, appetite change and fatigue.  HENT: Negative for hearing loss, congestion, tinnitus and ear discharge.   Eyes: Negative for visual disturbance (see optho q1y -- vision corrected to 20/20 with glasses).  Respiratory: Negative for cough, chest tightness and shortness of breath.   Cardiovascular: Negative for chest pain, palpitations and leg swelling.  Gastrointestinal: Negative for abdominal pain, diarrhea, constipation and abdominal distention.  Genitourinary: Negative for urgency, frequency, decreased urine volume and difficulty urinating.  Musculoskeletal: Negative for back pain, arthralgias and gait problem.  Skin: Negative for color change, pallor and rash.  Neurological: Negative for dizziness, light-headedness, numbness and headaches.  Hematological: Negative for adenopathy. Does not bruise/bleed easily.  Psychiatric/Behavioral: Negative for suicidal ideas, confusion, sleep disturbance, self-injury, dysphoric mood, decreased concentration and agitation.  Pt is able to read and write and can do all ADLs No risk for falling No abuse/ violence in home      Objective:     Vision by Snellen chart:  opth  Body mass index is 24.64 kg/(m^2). BP 158/88  Pulse 67  Temp 98 F (36.7 C) (Oral)  Ht 5\' 1"  (1.549 m)  Wt 130 lb 6.4 oz (59.149 kg)  BMI 24.64 kg/m2  SpO2 98%  BP 158/88  Pulse 67  Temp 98 F (36.7 C) (Oral)  Ht 5\' 1"  (1.549 m)  Wt 130 lb 6.4 oz (59.149 kg)  BMI 24.64 kg/m2  SpO2 98% General appearance: alert, cooperative, appears stated age and no distress Head: Normocephalic, without obvious abnormality, atraumatic Eyes: conjunctivae/corneas clear. PERRL, EOM's intact. Fundi benign. Ears: normal TM's and external ear canals both ears Nose: Nares normal. Septum midline. Mucosa normal. No drainage or sinus tenderness. Throat: lips, mucosa, and tongue normal; teeth and gums normal Neck: no adenopathy, no carotid bruit, no JVD, supple, symmetrical, trachea midline and thyroid not enlarged, symmetric, no tenderness/mass/nodules Back: symmetric, no curvature. ROM normal. No CVA tenderness. Lungs: clear to auscultation bilaterally Breasts: normal appearance, no masses or tenderness Heart: regular rate and rhythm, S1, S2 normal, no murmur, click, rub or gallop Abdomen: soft, non-tender; bowel sounds normal; no masses,  no organomegaly Pelvic: deferred and not indicated; post-menopausal, no abnormal Pap smears in past Extremities: extremities normal, atraumatic, no cyanosis or edema Pulses: 2+ and symmetric Skin: Skin color, texture, turgor normal. No rashes or lesions Lymph nodes: Cervical, supraclavicular, and axillary nodes normal. Neurologic: Alert and oriented X 3, normal strength and tone. Normal symmetric reflexes. Normal coordination and gait Psych--  No depression, no anxiety     Assessment:     cpe     Plan:     During the course of the visit the patient was educated and counseled about appropriate screening and preventive services including:    Pneumococcal vaccine   Influenza vaccine  Screening mammography  Screening Pap smear and pelvic exam    Bone densitometry screening  Colorectal cancer screening  Diabetes screening  Advanced directives: has an advanced directive - a copy HAS NOT been provided.  Diet review for nutrition referral? Yes ____  Not Indicated ____x   Patient Instructions (the written plan) was given to the patient.  Medicare Attestation I have personally reviewed: The patient's medical and social history Their use of alcohol, tobacco or illicit drugs Their current medications and supplements The patient's functional ability including ADLs,fall risks, home safety risks, cognitive, and hearing and visual impairment Diet and physical activities Evidence for depression or mood disorders  The patient's weight, height, BMI, and visual acuity have been recorded in the chart.  I have made referrals, counseling, and provided education to the patient based on review of the above and I have provided the patient with a written personalized care plan for preventive services.     Loreen Freud, DO   03/24/2012

## 2012-04-15 ENCOUNTER — Other Ambulatory Visit: Payer: Medicare Other

## 2012-04-21 ENCOUNTER — Other Ambulatory Visit (INDEPENDENT_AMBULATORY_CARE_PROVIDER_SITE_OTHER): Payer: Medicare Other

## 2012-04-21 DIAGNOSIS — E119 Type 2 diabetes mellitus without complications: Secondary | ICD-10-CM

## 2012-04-21 DIAGNOSIS — E876 Hypokalemia: Secondary | ICD-10-CM | POA: Diagnosis not present

## 2012-04-21 DIAGNOSIS — I10 Essential (primary) hypertension: Secondary | ICD-10-CM

## 2012-04-21 LAB — URINALYSIS
Nitrite: NEGATIVE
Specific Gravity, Urine: <=1.005 (ref 1.000–1.030)
Total Protein, Urine: NEGATIVE
pH: 7 (ref 5.0–8.0)

## 2012-04-21 LAB — CBC WITH DIFFERENTIAL/PLATELET
Basophils Relative: 0.8 % (ref 0.0–3.0)
Eosinophils Relative: 3.5 % (ref 0.0–5.0)
HCT: 43.3 % (ref 36.0–46.0)
Hemoglobin: 14.8 g/dL (ref 12.0–15.0)
Lymphs Abs: 2.2 10*3/uL (ref 0.7–4.0)
Monocytes Relative: 8.1 % (ref 3.0–12.0)
Neutro Abs: 2.3 10*3/uL (ref 1.4–7.7)
RDW: 13 % (ref 11.5–14.6)

## 2012-04-21 LAB — BASIC METABOLIC PANEL
CO2: 27 mEq/L (ref 19–32)
Chloride: 102 mEq/L (ref 96–112)
Sodium: 140 mEq/L (ref 135–145)

## 2012-04-21 LAB — LIPID PANEL
LDL Cholesterol: 66 mg/dL (ref 0–99)
Total CHOL/HDL Ratio: 2
Triglycerides: 77 mg/dL (ref 0.0–149.0)

## 2012-04-21 LAB — HEPATIC FUNCTION PANEL
Albumin: 4.3 g/dL (ref 3.5–5.2)
Alkaline Phosphatase: 75 U/L (ref 39–117)

## 2012-04-29 ENCOUNTER — Encounter: Payer: Self-pay | Admitting: *Deleted

## 2012-05-19 ENCOUNTER — Other Ambulatory Visit: Payer: Self-pay | Admitting: Family Medicine

## 2012-05-28 ENCOUNTER — Other Ambulatory Visit: Payer: Self-pay | Admitting: Family Medicine

## 2012-05-28 DIAGNOSIS — E876 Hypokalemia: Secondary | ICD-10-CM

## 2012-05-28 NOTE — Telephone Encounter (Signed)
Refill for Klor-Con sent to [facility] pharmacy

## 2012-08-25 ENCOUNTER — Other Ambulatory Visit: Payer: Self-pay | Admitting: Family Medicine

## 2012-08-25 MED ORDER — DIAZEPAM 5 MG PO TABS: 5.0000 mg | ORAL_TABLET | Freq: Every day | ORAL | Status: DC

## 2012-08-25 NOTE — Telephone Encounter (Signed)
Last seen and filled 03/24/12 #30 with 2 refills. Please advise     KP

## 2012-08-25 NOTE — Telephone Encounter (Signed)
Refill- diazepam 5mg  tab.

## 2012-09-10 DIAGNOSIS — H2510 Age-related nuclear cataract, unspecified eye: Secondary | ICD-10-CM | POA: Diagnosis not present

## 2012-09-10 DIAGNOSIS — E119 Type 2 diabetes mellitus without complications: Secondary | ICD-10-CM | POA: Diagnosis not present

## 2012-09-22 ENCOUNTER — Ambulatory Visit: Payer: Medicare Other | Admitting: Family Medicine

## 2012-10-20 ENCOUNTER — Encounter: Payer: Self-pay | Admitting: Lab

## 2012-10-21 ENCOUNTER — Other Ambulatory Visit (INDEPENDENT_AMBULATORY_CARE_PROVIDER_SITE_OTHER): Payer: Medicare Other

## 2012-10-21 DIAGNOSIS — E119 Type 2 diabetes mellitus without complications: Secondary | ICD-10-CM

## 2012-10-21 DIAGNOSIS — E785 Hyperlipidemia, unspecified: Secondary | ICD-10-CM | POA: Diagnosis not present

## 2012-10-21 LAB — HEPATIC FUNCTION PANEL
ALT: 26 U/L (ref 0–35)
Alkaline Phosphatase: 55 U/L (ref 39–117)
Bilirubin, Direct: 0.1 mg/dL (ref 0.0–0.3)
Total Bilirubin: 0.9 mg/dL (ref 0.3–1.2)
Total Protein: 7 g/dL (ref 6.0–8.3)

## 2012-10-21 LAB — BASIC METABOLIC PANEL
BUN: 20 mg/dL (ref 6–23)
Chloride: 105 mEq/L (ref 96–112)
Creatinine, Ser: 1.2 mg/dL (ref 0.4–1.2)
GFR: 47.79 mL/min — ABNORMAL LOW (ref >60.00)
Glucose, Bld: 95 mg/dL (ref 70–99)

## 2012-10-21 LAB — LIPID PANEL
Cholesterol: 144 mg/dL (ref 0–200)
LDL Cholesterol: 61 mg/dL (ref 0–99)

## 2012-10-21 LAB — HEMOGLOBIN A1C: Hgb A1c MFr Bld: 6 % (ref 4.6–6.5)

## 2012-10-23 DIAGNOSIS — Z79899 Other long term (current) drug therapy: Secondary | ICD-10-CM | POA: Diagnosis not present

## 2012-11-06 ENCOUNTER — Encounter: Payer: Self-pay | Admitting: Family Medicine

## 2012-11-06 ENCOUNTER — Ambulatory Visit (INDEPENDENT_AMBULATORY_CARE_PROVIDER_SITE_OTHER): Payer: Medicare Other | Admitting: Family Medicine

## 2012-11-06 VITALS — BP 140/76 | HR 69 | Temp 98.4°F | Wt 147.6 lb

## 2012-11-06 DIAGNOSIS — E119 Type 2 diabetes mellitus without complications: Secondary | ICD-10-CM | POA: Diagnosis not present

## 2012-11-06 DIAGNOSIS — I10 Essential (primary) hypertension: Secondary | ICD-10-CM

## 2012-11-06 DIAGNOSIS — E785 Hyperlipidemia, unspecified: Secondary | ICD-10-CM | POA: Diagnosis not present

## 2012-11-06 MED ORDER — SIMVASTATIN 20 MG PO TABS: 20.0000 mg | ORAL_TABLET | Freq: Every day | ORAL | Status: DC

## 2012-11-06 NOTE — Assessment & Plan Note (Signed)
Check labs con't meds 

## 2012-11-06 NOTE — Progress Notes (Signed)
  Subjective:    Patient here for follow-up of elevated blood pressure.  She is exercising and is adherent to a low-salt diet.  Blood pressure is well controlled at home. Cardiac symptoms: none. Patient denies: chest pain, chest pressure/discomfort, claudication, dyspnea, exertional chest pressure/discomfort, fatigue, irregular heart beat, lower extremity edema, near-syncope, orthopnea, palpitations, paroxysmal nocturnal dyspnea, syncope and tachypnea. Cardiovascular risk factors: advanced age (older than 1 for men, 1 for women), dyslipidemia and hypertension. Use of agents associated with hypertension: none. History of target organ damage: none.   DIABETES Disease Monitoring Blood Sugar ranges-not checking Polyuria- no New Visual problems- no Medications Compliance- good Hypoglycemic symptoms- no   HYPERLIPIDEMIA Disease Monitoring See symptoms for Hypertension Medications Compliance- good RUQ pain- no  Muscle aches- no  ROS See HPI above   PMH Smoking Status noted    The following portions of the patient's history were reviewed and updated as appropriate: allergies, current medications, past family history, past medical history, past social history, past surgical history and problem list.  Review of Systems Pertinent items are noted in HPI.     Objective:    BP 140/76  Pulse 69  Temp(Src) 98.4 F (36.9 C) (Oral)  Wt 147 lb 9.6 oz (66.951 kg)  BMI 27.9 kg/m2  SpO2 98% General appearance: alert, cooperative, appears stated age and no distress Throat: lips, mucosa, and tongue normal; teeth and gums normal Neck: no adenopathy, no carotid bruit, no JVD, supple, symmetrical, trachea midline and thyroid not enlarged, symmetric, no tenderness/mass/nodules Lungs: clear to auscultation bilaterally Heart: S1, S2 normal Extremities: extremities normal, atraumatic, no cyanosis or edema    Assessment:    Hypertension, normal blood pressure . Evidence of target organ damage:  none.    Plan:    Medication: no change. Screening labs for initial evaluation: none indicated. Dietary sodium restriction. Regular aerobic exercise. Check blood pressures 2-3 times weekly and record. Follow up: 6 months and as needed.

## 2012-11-06 NOTE — Patient Instructions (Addendum)
Calcium 1200-1500 mg daily with 1000u vita D3   Diabetes Meal Planning Guide The diabetes meal planning guide is a tool to help you plan your meals and snacks. It is important for people with diabetes to manage their blood glucose (sugar) levels. Choosing the right foods and the right amounts throughout your day will help control your blood glucose. Eating right can even help you improve your blood pressure and reach or maintain a healthy weight. CARBOHYDRATE COUNTING MADE EASY When you eat carbohydrates, they turn to sugar. This raises your blood glucose level. Counting carbohydrates can help you control this level so you feel better. When you plan your meals by counting carbohydrates, you can have more flexibility in what you eat and balance your medicine with your food intake. Carbohydrate counting simply means adding up the total amount of carbohydrate grams in your meals and snacks. Try to eat about the same amount at each meal. Foods with carbohydrates are listed below. Each portion below is 1 carbohydrate serving or 15 grams of carbohydrates. Ask your dietician how many grams of carbohydrates you should eat at each meal or snack. Grains and Starches  1 slice bread.   English muffin or hotdog/hamburger bun.   cup cold cereal (unsweetened).   cup cooked pasta or rice.   cup starchy vegetables (corn, potatoes, peas, beans, winter squash).  1 tortilla (6 inches).   bagel.  1 waffle or pancake (size of a CD).   cup cooked cereal.  4 to 6 small crackers. *Whole grain is recommended. Fruit  1 cup fresh unsweetened berries, melon, papaya, pineapple.  1 small fresh fruit.   banana or mango.   cup fruit juice (4 oz unsweetened).   cup canned fruit in natural juice or water.  2 tbs dried fruit.  12 to 15 grapes or cherries. Milk and Yogurt  1 cup fat-free or 1% milk.  1 cup soy milk.  6 oz light yogurt with sugar-free sweetener.  6 oz low-fat soy yogurt.  6  oz plain yogurt. Vegetables  1 cup raw or  cup cooked is counted as 0 carbohydrates or a "free" food.  If you eat 3 or more servings at 1 meal, count them as 1 carbohydrate serving. Other Carbohydrates   oz chips or pretzels.   cup ice cream or frozen yogurt.   cup sherbet or sorbet.  2 inch square cake, no frosting.  1 tbs honey, sugar, jam, jelly, or syrup.  2 small cookies.  3 squares of graham crackers.  3 cups popcorn.  6 crackers.  1 cup broth-based soup.  Count 1 cup casserole or other mixed foods as 2 carbohydrate servings.  Foods with less than 20 calories in a serving may be counted as 0 carbohydrates or a "free" food. You may want to purchase a book or computer software that lists the carbohydrate gram counts of different foods. In addition, the nutrition facts panel on the labels of the foods you eat are a good source of this information. The label will tell you how big the serving size is and the total number of carbohydrate grams you will be eating per serving. Divide this number by 15 to obtain the number of carbohydrate servings in a portion. Remember, 1 carbohydrate serving equals 15 grams of carbohydrate. SERVING SIZES Measuring foods and serving sizes helps you make sure you are getting the right amount of food. The list below tells how big or small some common serving sizes are.  1 oz.........4  stacked dice.  3 oz........Marland KitchenDeck of cards.  1 tsp.......Marland KitchenTip of little finger.  1 tbs......Marland KitchenMarland KitchenThumb.  2 tbs.......Marland KitchenGolf ball.   cup......Marland KitchenHalf of a fist.  1 cup.......Marland KitchenA fist. SAMPLE DIABETES MEAL PLAN Below is a sample meal plan that includes foods from the grain and starches, dairy, vegetable, fruit, and meat groups. A dietician can individualize a meal plan to fit your calorie needs and tell you the number of servings needed from each food group. However, controlling the total amount of carbohydrates in your meal or snack is more important than making  sure you include all of the food groups at every meal. You may interchange carbohydrate containing foods (dairy, starches, and fruits). The meal plan below is an example of a 2000 calorie diet using carbohydrate counting. This meal plan has 17 carbohydrate servings. Breakfast  1 cup oatmeal (2 carb servings).   cup light yogurt (1 carb serving).  1 cup blueberries (1 carb serving).   cup almonds. Snack  1 large apple (2 carb servings).  1 low-fat string cheese stick. Lunch  Chicken breast salad.  1 cup spinach.   cup chopped tomatoes.  2 oz chicken breast, sliced.  2 tbs low-fat [place] dressing.  12 whole-wheat crackers (2 carb servings).  12 to 15 grapes (1 carb serving).  1 cup low-fat milk (1 carb serving). Snack  1 cup carrots.   cup hummus (1 carb serving). Dinner  3 oz broiled salmon.  1 cup brown rice (3 carb servings). Snack  1  cups steamed broccoli (1 carb serving) drizzled with 1 tsp olive oil and lemon juice.  1 cup light pudding (2 carb servings). DIABETES MEAL PLANNING WORKSHEET Your dietician can use this worksheet to help you decide how many servings of foods and what types of foods are right for you.  BREAKFAST Food Group and Servings / Carb Servings Grain/Starches __________________________________ Dairy __________________________________________ Vegetable ______________________________________ Fruit ___________________________________________ Meat __________________________________________ Fat ____________________________________________ LUNCH Food Group and Servings / Carb Servings Grain/Starches ___________________________________ Dairy ___________________________________________ Fruit ____________________________________________ Meat ___________________________________________ Fat _____________________________________________ Laural Golden Food Group and Servings / Carb Servings Grain/Starches  ___________________________________ Dairy ___________________________________________ Fruit ____________________________________________ Meat ___________________________________________ Fat _____________________________________________ SNACKS Food Group and Servings / Carb Servings Grain/Starches ___________________________________ Dairy ___________________________________________ Vegetable _______________________________________ Fruit ____________________________________________ Meat ___________________________________________ Fat _____________________________________________ DAILY TOTALS Starches _________________________ Vegetable ________________________ Fruit ____________________________ Dairy ____________________________ Meat ____________________________ Fat ______________________________ Document Released: 01/18/2005 Document Revised: 07/16/2011 Document Reviewed: 11/29/2008 [employer] Patient Information 2014 [employer]

## 2012-11-06 NOTE — Assessment & Plan Note (Signed)
Check labs Cont' meds 

## 2012-12-12 ENCOUNTER — Encounter: Payer: Self-pay | Admitting: Family Medicine

## 2012-12-14 IMAGING — RF DG UGI W/ GASTROGRAFIN
4 series · 9 of 9 positions shown · IV contrast (agent unspecified)
Comparison: 02/01/2010.

CLINICAL DATA: Laparoscopic Nissen fundoplication 06/09/2010.
Evaluate for leak.

WATER SOLUBLE UPPER GI SERIES
TECHNIQUE: Single-column upper GI series was performed using water
soluble contrast.
Fluoroscopy Time: 0.7 minutes.
Contrast: 50 ml [id number].

[Series 1: run · 1 of 1 slices shown (1 of 4)]
[im 1/1]
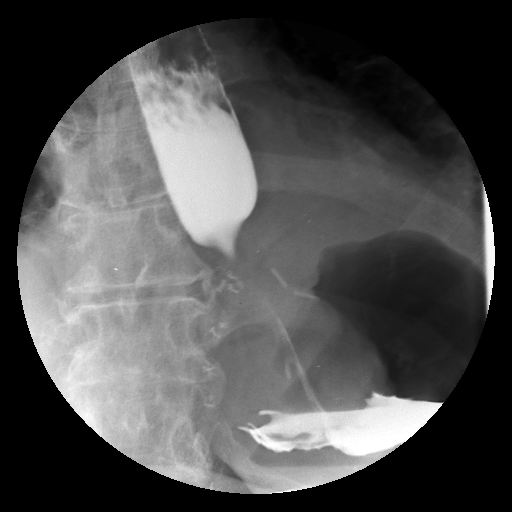

[Series 2: run · 5 of 5 slices shown (2 of 4)]
[im 1/5]
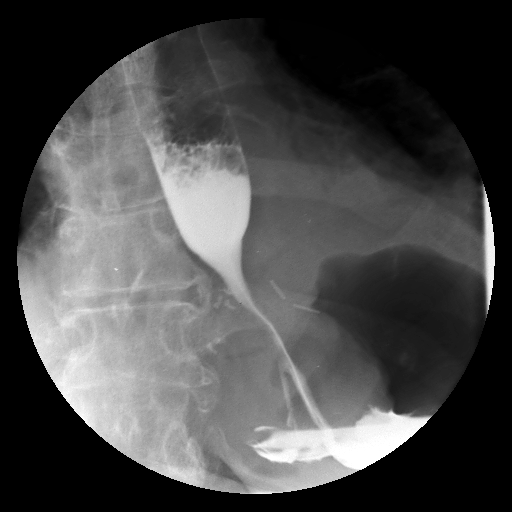
[im 2/5]
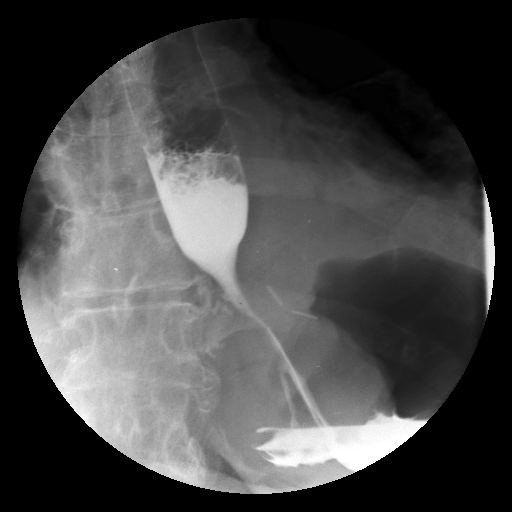
[im 3/5]
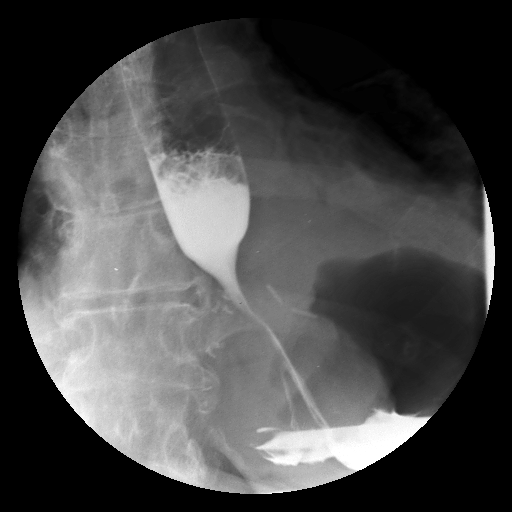
[im 4/5]
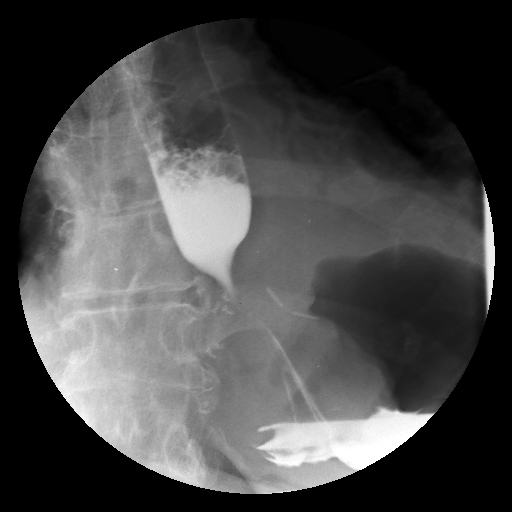
[im 5/5]
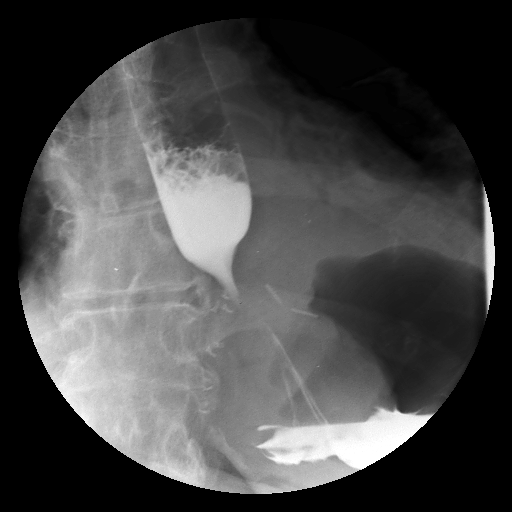

[Series 3: run · 1 of 1 slices shown (3 of 4)]
[im 1/1]
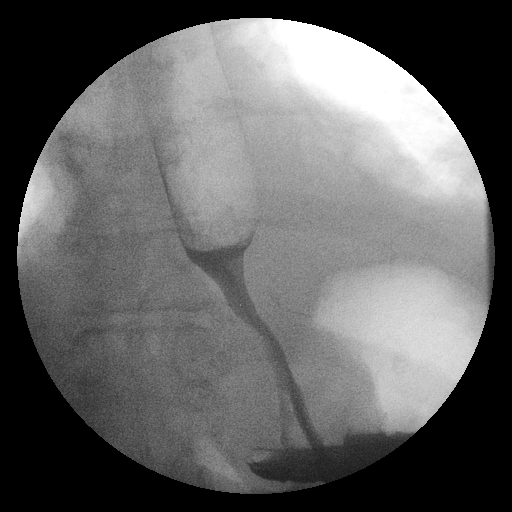

[Series 4: run · 2 of 2 slices shown (4 of 4)]
[im 1/2]
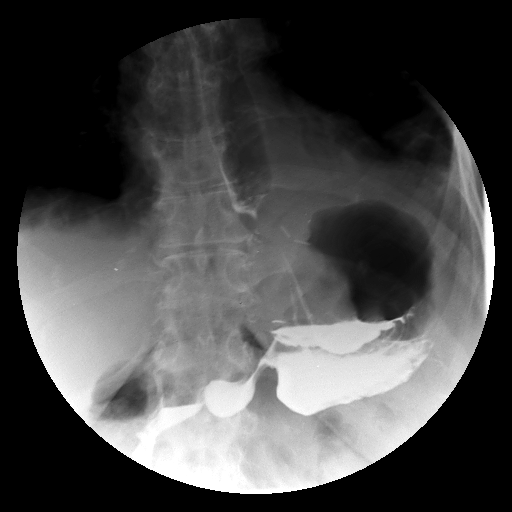
[im 2/2]
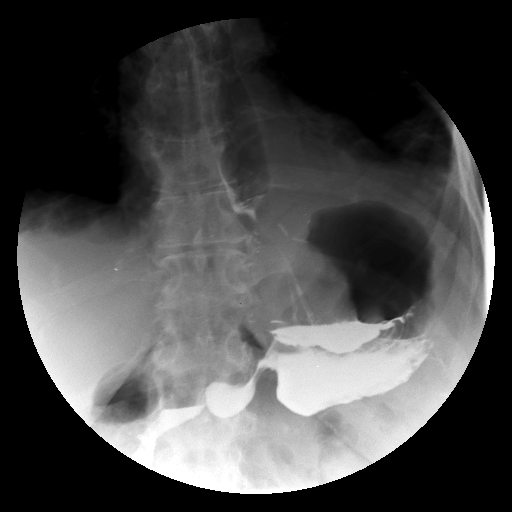

[9 of 9 positions shown; findings below may reference images not displayed]

FINDINGS: Limited exam of the upper gastrointestinal tract was
performed with water soluble contrast.  The patient is status post
Nissen fundoplication.  No evidence of leak.
IMPRESSION: Nissen fundoplication without complicating feature.

## 2012-12-23 ENCOUNTER — Other Ambulatory Visit: Payer: Self-pay | Admitting: Family Medicine

## 2012-12-24 NOTE — Telephone Encounter (Signed)
Last seen 11/06/12 and filled 08/25/12 #30 with 2 refills. Please advise    KP

## 2013-03-27 ENCOUNTER — Encounter: Payer: Medicare Other | Admitting: Family Medicine

## 2013-04-08 ENCOUNTER — Other Ambulatory Visit: Payer: Self-pay

## 2013-04-08 MED ORDER — TRIAMTERENE-HCTZ 75-50 MG PO TABS: ORAL_TABLET | ORAL | Status: DC

## 2013-04-21 ENCOUNTER — Other Ambulatory Visit: Payer: Self-pay | Admitting: Family Medicine

## 2013-04-21 NOTE — Telephone Encounter (Signed)
Last seen 11/06/12 and filled 12/23/12 #30. please advise      KP

## 2013-04-23 ENCOUNTER — Encounter: Payer: Medicare Other | Admitting: Family Medicine

## 2013-05-12 DIAGNOSIS — H2510 Age-related nuclear cataract, unspecified eye: Secondary | ICD-10-CM | POA: Diagnosis not present

## 2013-06-02 DIAGNOSIS — H2510 Age-related nuclear cataract, unspecified eye: Secondary | ICD-10-CM | POA: Diagnosis not present

## 2013-06-07 HISTORY — PX: EYE SURGERY: SHX253

## 2013-06-07 LAB — HM DIABETES EYE EXAM

## 2013-06-08 DIAGNOSIS — H2589 Other age-related cataract: Secondary | ICD-10-CM | POA: Diagnosis not present

## 2013-06-08 DIAGNOSIS — H2510 Age-related nuclear cataract, unspecified eye: Secondary | ICD-10-CM | POA: Diagnosis not present

## 2013-06-08 DIAGNOSIS — H269 Unspecified cataract: Secondary | ICD-10-CM | POA: Diagnosis not present

## 2013-06-16 DIAGNOSIS — H2510 Age-related nuclear cataract, unspecified eye: Secondary | ICD-10-CM | POA: Diagnosis not present

## 2013-06-29 DIAGNOSIS — H2589 Other age-related cataract: Secondary | ICD-10-CM | POA: Diagnosis not present

## 2013-06-29 DIAGNOSIS — H2510 Age-related nuclear cataract, unspecified eye: Secondary | ICD-10-CM | POA: Diagnosis not present

## 2013-06-29 DIAGNOSIS — H269 Unspecified cataract: Secondary | ICD-10-CM | POA: Diagnosis not present

## 2013-07-09 ENCOUNTER — Other Ambulatory Visit: Payer: Self-pay | Admitting: Family Medicine

## 2013-08-18 ENCOUNTER — Other Ambulatory Visit: Payer: Self-pay | Admitting: Family Medicine

## 2013-09-15 ENCOUNTER — Other Ambulatory Visit: Payer: Self-pay | Admitting: Family Medicine

## 2013-10-09 ENCOUNTER — Encounter: Payer: Medicare Other | Admitting: Family Medicine

## 2013-10-12 ENCOUNTER — Telehealth: Payer: Self-pay

## 2013-10-12 NOTE — Telephone Encounter (Addendum)
Left message with pt's husband for call back.    CCS- 05/19/04 MMG- 09/14/03- negative BD- 12/24/02- normal Td- 05/17/04 PNA- 05/17/04 Z- DUE Foot Exam- 11/06/12 Eye Exam- DUE Urine Microalbumin- DUE Hemoglobin A1C- DUE

## 2013-10-12 NOTE — Telephone Encounter (Signed)
Medication and allergies:  Reviewed and updated  90 day supply/mail order: n/a Local pharmacy:  [id number] - [place], [place] - [address] AT [facility] [address]   Immunizations due:  Shingles   A/P: CCS- 05/19/04  MMG- 09/14/03- negative  BD- 12/24/02- normal  Td- 05/17/04  PNA- 05/17/04  Z- DUE  Foot Exam- 11/06/12  Eye Exam- states she has an eye exam in Feb.   Urine Microalbumin- DUE  Hemoglobin A1C- DUE   To Discuss with Provider: Naproxen was listed on patient's medication list, however, patients states that she has never taken it.  She expressed that she has been having some pains in her lower back and would like a prescription for naproxen.

## 2013-10-13 ENCOUNTER — Encounter: Payer: Self-pay | Admitting: Family Medicine

## 2013-10-13 ENCOUNTER — Ambulatory Visit (INDEPENDENT_AMBULATORY_CARE_PROVIDER_SITE_OTHER): Payer: Medicare Other | Admitting: Family Medicine

## 2013-10-13 VITALS — BP 138/76 | HR 58 | Temp 97.8°F | Ht 62.5 in | Wt 148.8 lb

## 2013-10-13 DIAGNOSIS — M549 Dorsalgia, unspecified: Secondary | ICD-10-CM

## 2013-10-13 DIAGNOSIS — Z0000 Encounter for general adult medical examination without abnormal findings: Secondary | ICD-10-CM | POA: Diagnosis not present

## 2013-10-13 DIAGNOSIS — I10 Essential (primary) hypertension: Secondary | ICD-10-CM | POA: Diagnosis not present

## 2013-10-13 DIAGNOSIS — E1149 Type 2 diabetes mellitus with other diabetic neurological complication: Secondary | ICD-10-CM

## 2013-10-13 DIAGNOSIS — E785 Hyperlipidemia, unspecified: Secondary | ICD-10-CM | POA: Diagnosis not present

## 2013-10-13 DIAGNOSIS — F411 Generalized anxiety disorder: Secondary | ICD-10-CM

## 2013-10-13 LAB — BASIC METABOLIC PANEL
BUN: 21 mg/dL (ref 6–23)
CALCIUM: 9.7 mg/dL (ref 8.4–10.5)
CO2: 27 mEq/L (ref 19–32)
Chloride: 105 mEq/L (ref 96–112)
Creatinine, Ser: 1.1 mg/dL (ref 0.4–1.2)
GFR: 51.19 mL/min — ABNORMAL LOW (ref >60.00)
GLUCOSE: 95 mg/dL (ref 70–99)
Potassium: 3.9 mEq/L (ref 3.5–5.1)
SODIUM: 141 meq/L (ref 135–145)

## 2013-10-13 LAB — LIPID PANEL
Cholesterol: 146 mg/dL (ref 0–200)
HDL: 67.9 mg/dL (ref >39.00)
LDL CALC: 66 mg/dL (ref 0–99)
NONHDL: 78.1
Total CHOL/HDL Ratio: 2
Triglycerides: 61 mg/dL (ref 0.0–149.0)
VLDL: 12.2 mg/dL (ref 0.0–40.0)

## 2013-10-13 LAB — CBC WITH DIFFERENTIAL/PLATELET
BASOS ABS: 0 10*3/uL (ref 0.0–0.1)
Basophils Relative: 0.8 % (ref 0.0–3.0)
EOS PCT: 2.9 % (ref 0.0–5.0)
Eosinophils Absolute: 0.2 10*3/uL (ref 0.0–0.7)
HCT: 43.7 % (ref 36.0–46.0)
Hemoglobin: 14.6 g/dL (ref 12.0–15.0)
LYMPHS PCT: 36.5 % (ref 12.0–46.0)
Lymphs Abs: 2 10*3/uL (ref 0.7–4.0)
MCHC: 33.4 g/dL (ref 30.0–36.0)
MCV: 84.4 fl (ref 78.0–100.0)
MONOS PCT: 8.8 % (ref 3.0–12.0)
Monocytes Absolute: 0.5 10*3/uL (ref 0.1–1.0)
NEUTROS ABS: 2.9 10*3/uL (ref 1.4–7.7)
Neutrophils Relative %: 51 % (ref 43.0–77.0)
Platelets: 213 10*3/uL (ref 150.0–400.0)
RBC: 5.17 Mil/uL — ABNORMAL HIGH (ref 3.87–5.11)
RDW: 13.4 % (ref 11.5–15.5)
WBC: 5.6 10*3/uL (ref 4.0–10.5)

## 2013-10-13 LAB — HEPATIC FUNCTION PANEL
ALBUMIN: 4.3 g/dL (ref 3.5–5.2)
ALK PHOS: 55 U/L (ref 39–117)
ALT: 24 U/L (ref 0–35)
AST: 23 U/L (ref 0–37)
BILIRUBIN TOTAL: 1.3 mg/dL — AB (ref 0.2–1.2)
Bilirubin, Direct: 0.2 mg/dL (ref 0.0–0.3)
Total Protein: 7.1 g/dL (ref 6.0–8.3)

## 2013-10-13 LAB — HEMOGLOBIN A1C: HEMOGLOBIN A1C: 6 % (ref 4.6–6.5)

## 2013-10-13 MED ORDER — ATENOLOL 50 MG PO TABS: ORAL_TABLET | ORAL | Status: DC

## 2013-10-13 MED ORDER — SERTRALINE HCL 50 MG PO TABS: 50.0000 mg | ORAL_TABLET | Freq: Every day | ORAL | Status: DC

## 2013-10-13 MED ORDER — DIAZEPAM 5 MG PO TABS: ORAL_TABLET | ORAL | Status: DC

## 2013-10-13 MED ORDER — POTASSIUM CHLORIDE CRYS ER 20 MEQ PO TBCR: EXTENDED_RELEASE_TABLET | ORAL | Status: DC

## 2013-10-13 MED ORDER — SIMVASTATIN 20 MG PO TABS: 20.0000 mg | ORAL_TABLET | Freq: Every day | ORAL | Status: DC

## 2013-10-13 MED ORDER — TRIAMTERENE-HCTZ 75-50 MG PO TABS: ORAL_TABLET | ORAL | Status: DC

## 2013-10-13 MED ORDER — TIZANIDINE HCL 4 MG PO TABS: 4.0000 mg | ORAL_TABLET | Freq: Four times a day (QID) | ORAL | Status: DC | PRN

## 2013-10-13 MED ORDER — LOSARTAN POTASSIUM 100 MG PO TABS: ORAL_TABLET | ORAL | Status: DC

## 2013-10-13 NOTE — Patient Instructions (Signed)

## 2013-10-13 NOTE — Progress Notes (Signed)
Pre visit review using our clinic review tool, if applicable. No additional management support is needed unless otherwise documented below in the visit note. 

## 2013-10-13 NOTE — Progress Notes (Signed)
Subjective:    [name] is a 79 y.o. female who presents for Medicare Annual/Subsequent preventive examination.  Preventive Screening-Counseling & Management  Tobacco History  Smoking status  . Never Smoker   Smokeless tobacco  . Never Used     Problems Prior to Visit 1. Back pain started a few weeks ago.  She is better but still has some spasms.    Current Problems (verified) Patient Active Problem List   Diagnosis Date Noted  . Hypokalemia 08/03/2010  . Situational mixed anxiety and depressive disorder 08/03/2010  . HYPERLIPIDEMIA 11/01/2006  . THYROID NODULE, HX OF 11/01/2006  . DIABETES MELLITUS, TYPE II 09/24/2006  . HYPERTENSION 09/24/2006  . ABSCESS, RECTUM 09/24/2006  . FATTY LIVER DISEASE 09/24/2006  . LIVER FUNCTION TESTS, ABNORMAL 09/24/2006  . FOOT INJURY 09/24/2006  . HEMORRHOIDECTOMY, HX OF 09/24/2006  . TONSILLECTOMY AND ADENOIDECTOMY, HX OF 03/17/2001    Medications Prior to Visit Current Outpatient Prescriptions on File Prior to Visit  Medication Sig Dispense Refill  . aspirin 81 MG tablet Take 81 mg by mouth daily.        Marland Kitchen. glucose blood (FREESTYLE TEST STRIPS) test strip Check Blood sugar once daily. DX 250.00  100 each  1  . Lancets (FREESTYLE) lancets 1 each by Other route as needed. Use as instructed        No current facility-administered medications on file prior to visit.    Current Medications (verified) Current Outpatient Prescriptions  Medication Sig Dispense Refill  . aspirin 81 MG tablet Take 81 mg by mouth daily.        Marland Kitchen. atenolol (TENORMIN) 50 MG tablet 1 tab by mouth daily--  90 tablet  3  . diazepam (VALIUM) 5 MG tablet TAKE 1 TABLET BY MOUTH EVERY DAY  45 tablet  2  . glucose blood (FREESTYLE TEST STRIPS) test strip Check Blood sugar once daily. DX 250.00  100 each  1  . Lancets (FREESTYLE) lancets 1 each by Other route as needed. Use as instructed       . losartan (COZAAR) 100 MG tablet TAKE 1 TABLET BY MOUTH DAILY   90 tablet  3  . potassium chloride SA (K-DUR,KLOR-CON) 20 MEQ tablet TAKE 1 TABLET BY MOUTH ONCE DAILY  90 tablet  3  . simvastatin (ZOCOR) 20 MG tablet Take 1 tablet (20 mg total) by mouth at bedtime.  90 tablet  3  . triamterene-hydrochlorothiazide (MAXZIDE) 75-50 MG per tablet 1/2 tab po qd  45 tablet  1  . sertraline (ZOLOFT) 50 MG tablet Take 1 tablet (50 mg total) by mouth daily.  30 tablet  3  . tiZANidine (ZANAFLEX) 4 MG tablet Take 1 tablet (4 mg total) by mouth every 6 (six) hours as needed for muscle spasms.  30 tablet  0   No current facility-administered medications for this visit.     Allergies (verified) Sulfonamide derivatives   PAST HISTORY  Family History Family History  Problem Relation Age of Onset  . Diabetes    . Hypertension    . Other Brother     Rhabdo secondary to Statin  . Diabetes Brother   . Hypertension Brother   . Diabetes Mother   . Hypertension Mother   . Hypertension Sister   . Mental retardation Sister     Social History History  Substance Use Topics  . Smoking status: Never Smoker   . Smokeless tobacco: Never Used  . Alcohol Use: No     Are  there smokers in your home (other than you)? No  Risk Factors Current exercise habits: walking daily  Dietary issues discussed: na   Cardiac risk factors: diabetes mellitus, dyslipidemia and hypertension.  Depression Screen (Note: if answer to either of the following is "Yes", a more complete depression screening is indicated)   Over the past two weeks, have you felt down, depressed or hopeless? Yes  Over the past two weeks, have you felt little interest or pleasure in doing things? Yes  Have you lost interest or pleasure in daily life? No  Do you often feel hopeless? No  Do you cry easily over simple problems? No  Activities of Daily Living In your present state of health, do you have any difficulty performing the following activities?:  Driving? No Managing money?  No Feeding yourself?  No Getting from bed to chair? No Climbing a flight of stairs? No Preparing food and eating?: No Bathing or showering? No Getting dressed: No Getting to the toilet? No Using the toilet:No Moving around from place to place: No In the past year have you fallen or had a near fall?:No   Are you sexually active?  No  Do you have more than one partner?  No  Hearing Difficulties: No Do you often ask people to speak up or repeat themselves? No Do you experience ringing or noises in your ears? No Do you have difficulty understanding soft or whispered voices? No   Do you feel that you have a problem with memory? No  Do you often misplace items? No  Do you feel safe at home?  Yes  Cognitive Testing  Alert? Yes  Normal Appearance?Yes  Oriented to person? Yes  Place? Yes   Time? Yes  Recall of three objects?  Yes  Can perform simple calculations? Yes  Displays appropriate judgment?Yes  Can read the correct time from a watch face?Yes   Advanced Directives have been discussed with the patient? Yes  List the Names of Other Physician/Practitioners you currently use: 1.  ophth--[facility] 2.  Dentist-- [facility]all   Indicate any recent Medical Services you may have received from other than [facility] providers in the past year (date may be approximate).  Immunization History  Administered Date(s) Administered  . Influenza Split 02/09/2011, 03/06/2012  . Influenza Whole 02/24/2008  . Pneumococcal Polysaccharide-23 05/17/2004  . Td 05/17/2004    Screening Tests Health Maintenance  Topic Date Due  . Hemoglobin A1c  04/22/2013  . Influenza Vaccine  12/05/2013  . Tetanus/tdap  05/17/2014  . Colonoscopy  05/19/2014  . Ophthalmology Exam  06/07/2014  . Foot Exam  10/14/2014  . Pneumococcal Polysaccharide Vaccine Age 19 And Over  Completed  . Zostavax  Addressed    All answers were reviewed with the patient and necessary referrals were made:  Loreen Freud, DO   10/13/2013   History reviewed:   She  has a past medical history of Diabetes mellitus; Hypertension; Hyperlipidemia; Thyroid nodule; Abscess, rectum; and Fatty liver disease, nonalcoholic. She  does not have any pertinent problems on file. She  has past surgical history that includes Abdominal hysterectomy; Anal fissure repair; Tonsillectomy and adenoidectomy; Hemorrhoid surgery; Cataract extraction, bilateral (Feb); and Eye surgery (06/2013). Her family history includes Diabetes in her brother, mother, and another family member; Hypertension in her brother, mother, sister, and another family member; Mental retardation in her sister; Other in her brother. She  reports that she has never smoked. She has never used smokeless tobacco. She reports that she does  not drink alcohol or use illicit drugs. She has a current medication list which includes the following prescription(s): aspirin, atenolol, diazepam, glucose blood, freestyle, losartan, potassium chloride sa, simvastatin, triamterene-hydrochlorothiazide, sertraline, and tizanidine. Current Outpatient Prescriptions on File Prior to Visit  Medication Sig Dispense Refill  . aspirin 81 MG tablet Take 81 mg by mouth daily.        Marland Kitchen glucose blood (FREESTYLE TEST STRIPS) test strip Check Blood sugar once daily. DX 250.00  100 each  1  . Lancets (FREESTYLE) lancets 1 each by Other route as needed. Use as instructed        No current facility-administered medications on file prior to visit.   She is allergic to sulfonamide derivatives.  Review of Systems  Review of Systems  Constitutional: Negative for activity change, appetite change and fatigue.  HENT: Negative for hearing loss, congestion, tinnitus and ear discharge.   Eyes: Negative for visual disturbance (see optho q1y -- vision corrected to 20/20 with glasses).  Respiratory: Negative for cough, chest tightness and shortness of breath.   Cardiovascular: Negative for chest pain, palpitations and leg swelling.   Gastrointestinal: Negative for abdominal pain, diarrhea, constipation and abdominal distention.  Genitourinary: Negative for urgency, frequency, decreased urine volume and difficulty urinating.  Musculoskeletal: Negative for back pain, arthralgias and gait problem.  Skin: Negative for color change, pallor and rash.  Neurological: Negative for dizziness, light-headedness, numbness and headaches.  Hematological: Negative for adenopathy. Does not bruise/bleed easily.  Psychiatric/Behavioral: Negative for suicidal ideas, confusion, sleep disturbance, self-injury, dysphoric mood, decreased concentration and agitation.  Pt is able to read and write and can do all ADLs No risk for falling No abuse/ violence in home      Objective:     Vision by Snellen chart: opth  Body mass index is 26.77 kg/(m^2). BP 138/76  Pulse 58  Temp(Src) 97.8 F (36.6 C) (Oral)  Ht 5' 2.5" (1.588 m)  Wt 148 lb 12.8 oz (67.495 kg)  BMI 26.77 kg/m2  SpO2 97%  BP 138/76  Pulse 58  Temp(Src) 97.8 F (36.6 C) (Oral)  Ht 5' 2.5" (1.588 m)  Wt 148 lb 12.8 oz (67.495 kg)  BMI 26.77 kg/m2  SpO2 97% General appearance: alert, cooperative, appears stated age and no distress Head: Normocephalic, without obvious abnormality, atraumatic Eyes: conjunctivae/corneas clear. PERRL, EOM's intact. Fundi benign. Ears: normal TM's and external ear canals both ears Nose: Nares normal. Septum midline. Mucosa normal. No drainage or sinus tenderness. Throat: lips, mucosa, and tongue normal; teeth and gums normal Neck: no adenopathy, no carotid bruit, no JVD, supple, symmetrical, trachea midline and thyroid not enlarged, symmetric, no tenderness/mass/nodules Back: symmetric, no curvature. ROM normal. No CVA tenderness. Lungs: clear to auscultation bilaterally Breasts: normal appearance, no masses or tenderness Heart: regular rate and rhythm, S1, S2 normal, no murmur, click, rub or gallop Abdomen: soft, non-tender; bowel sounds  normal; no masses,  no organomegaly Pelvic: not indicated; post-menopausal, no abnormal Pap smears in past Extremities: extremities normal, atraumatic, no cyanosis or edema Pulses: 2+ and symmetric Skin: Skin color, texture, turgor normal. No rashes or lesions Lymph nodes: Cervical, supraclavicular, and axillary nodes normal. Neurologic: Alert and oriented X 3, normal strength and tone. Normal symmetric reflexes. Normal coordination and gait Psych-- no anxiety, no depression      Assessment:     cpe      Plan:     During the course of the visit the patient was educated and counseled about appropriate screening and  preventive services including:    Diabetes screening  Glaucoma screening  Advanced directives: has an advanced directive - a copy HAS NOT been provided. - pt refusing vaccines , mammogram, bmd  Diet review for nutrition referral? Yes ____  Not Indicated _x___   Patient Instructions (the written plan) was given to the patient.  Medicare Attestation I have personally reviewed: The patient's medical and social history Their use of alcohol, tobacco or illicit drugs Their current medications and supplements The patient's functional ability including ADLs,fall risks, home safety risks, cognitive, and hearing and visual impairment Diet and physical activities Evidence for depression or mood disorders  The patient's weight, height, BMI, and visual acuity have been recorded in the chart.  I have made referrals, counseling, and provided education to the patient based on review of the above and I have provided the patient with a written personalized care plan for preventive services.    1. Generalized anxiety disorder To start meds-- f/u 6 months or sooner prn - sertraline (ZOLOFT) 50 MG tablet; Take 1 tablet (50 mg total) by mouth daily.  Dispense: 30 tablet; Refill: 3 - diazepam (VALIUM) 5 MG tablet; TAKE 1 TABLET BY MOUTH EVERY DAY  Dispense: 45 tablet; Refill: 2 -  Basic metabolic panel  2. Back pain rto prn - tiZANidine (ZANAFLEX) 4 MG tablet; Take 1 tablet (4 mg total) by mouth every 6 (six) hours as needed for muscle spasms.  Dispense: 30 tablet; Refill: 0  3. Hyperlipidemia Check labs - simvastatin (ZOCOR) 20 MG tablet; Take 1 tablet (20 mg total) by mouth at bedtime.  Dispense: 90 tablet; Refill: 3 - Hepatic function panel - Lipid panel - Hepatic function panel; Future - Lipid panel; Future  4. HTN (hypertension) stable - atenolol (TENORMIN) 50 MG tablet; 1 tab by mouth daily--  Dispense: 90 tablet; Refill: 3 - triamterene-hydrochlorothiazide (MAXZIDE) 75-50 MG per tablet; 1/2 tab po qd  Dispense: 45 tablet; Refill: 1 - potassium chloride SA (K-DUR,KLOR-CON) 20 MEQ tablet; TAKE 1 TABLET BY MOUTH ONCE DAILY  Dispense: 90 tablet; Refill: 3 - losartan (COZAAR) 100 MG tablet; TAKE 1 TABLET BY MOUTH DAILY  Dispense: 90 tablet; Refill: 3 - CBC with Differential - POCT urinalysis dipstick - Basic metabolic panel; Future - POCT urinalysis dipstick; Future  5. Type II or unspecified type diabetes mellitus with neurological manifestations, not stated as uncontrolled con't meds, check labs Pt is not checking bld sugars and doesn't want to - Hemoglobin A1c - Hemoglobin A1c; Future  6. Medicare annual wellness visit, subsequent     Loreen Freud, DO   10/13/2013

## 2013-10-14 ENCOUNTER — Telehealth: Payer: Self-pay | Admitting: Family Medicine

## 2013-10-14 NOTE — Telephone Encounter (Signed)
Relevant patient education mailed to patient.  

## 2014-02-23 DIAGNOSIS — E119 Type 2 diabetes mellitus without complications: Secondary | ICD-10-CM | POA: Diagnosis not present

## 2014-04-15 ENCOUNTER — Other Ambulatory Visit: Payer: Medicare Other

## 2014-04-19 ENCOUNTER — Other Ambulatory Visit: Payer: Self-pay | Admitting: Family Medicine

## 2014-04-22 ENCOUNTER — Ambulatory Visit: Payer: Medicare Other | Admitting: Family Medicine

## 2014-05-20 ENCOUNTER — Other Ambulatory Visit: Payer: Medicare Other

## 2014-05-25 ENCOUNTER — Encounter: Payer: Self-pay | Admitting: Gastroenterology

## 2014-05-27 ENCOUNTER — Ambulatory Visit: Payer: Medicare Other | Admitting: Family Medicine

## 2014-06-03 ENCOUNTER — Ambulatory Visit: Payer: Medicare Other | Admitting: Family Medicine

## 2014-06-24 ENCOUNTER — Other Ambulatory Visit: Payer: Medicare Other

## 2014-07-01 ENCOUNTER — Ambulatory Visit: Payer: Medicare Other | Admitting: Family Medicine

## 2014-07-08 ENCOUNTER — Other Ambulatory Visit (INDEPENDENT_AMBULATORY_CARE_PROVIDER_SITE_OTHER): Payer: Medicare Other

## 2014-07-08 DIAGNOSIS — I10 Essential (primary) hypertension: Secondary | ICD-10-CM | POA: Diagnosis not present

## 2014-07-08 DIAGNOSIS — E1149 Type 2 diabetes mellitus with other diabetic neurological complication: Secondary | ICD-10-CM | POA: Diagnosis not present

## 2014-07-08 DIAGNOSIS — R319 Hematuria, unspecified: Secondary | ICD-10-CM

## 2014-07-08 DIAGNOSIS — E785 Hyperlipidemia, unspecified: Secondary | ICD-10-CM

## 2014-07-08 LAB — HEPATIC FUNCTION PANEL
ALBUMIN: 4.5 g/dL (ref 3.5–5.2)
ALK PHOS: 71 U/L (ref 39–117)
ALT: 19 U/L (ref 0–35)
AST: 19 U/L (ref 0–37)
BILIRUBIN DIRECT: 0.1 mg/dL (ref 0.0–0.3)
Total Bilirubin: 0.8 mg/dL (ref 0.2–1.2)
Total Protein: 7.4 g/dL (ref 6.0–8.3)

## 2014-07-08 LAB — BASIC METABOLIC PANEL
BUN: 24 mg/dL — ABNORMAL HIGH (ref 6–23)
CHLORIDE: 104 meq/L (ref 96–112)
CO2: 27 mEq/L (ref 19–32)
Calcium: 9.9 mg/dL (ref 8.4–10.5)
Creatinine, Ser: 1.06 mg/dL (ref 0.40–1.20)
GFR: 53.32 mL/min — ABNORMAL LOW (ref >60.00)
Glucose, Bld: 117 mg/dL — ABNORMAL HIGH (ref 70–99)
Potassium: 3.5 mEq/L (ref 3.5–5.1)
Sodium: 139 mEq/L (ref 135–145)

## 2014-07-08 LAB — HEMOGLOBIN A1C: Hgb A1c MFr Bld: 6.1 % (ref 4.6–6.5)

## 2014-07-08 LAB — POCT URINALYSIS DIPSTICK
Bilirubin, UA: NEGATIVE
GLUCOSE UA: NEGATIVE
Ketones, UA: NEGATIVE
LEUKOCYTES UA: NEGATIVE
Nitrite, UA: NEGATIVE
PROTEIN UA: NEGATIVE
Spec Grav, UA: 1.01
UROBILINOGEN UA: 0.2
pH, UA: 6

## 2014-07-08 LAB — LIPID PANEL
CHOL/HDL RATIO: 2
Cholesterol: 150 mg/dL (ref 0–200)
HDL: 69.6 mg/dL (ref >39.00)
LDL CALC: 63 mg/dL (ref 0–99)
NONHDL: 80.4
Triglycerides: 85 mg/dL (ref 0.0–149.0)
VLDL: 17 mg/dL (ref 0.0–40.0)

## 2014-07-08 NOTE — Addendum Note (Signed)
Addended by: Verdie ShireBAYNES, Malvin Morrish M on: 07/08/2014 08:26 AM   Modules accepted: Orders

## 2014-07-09 ENCOUNTER — Telehealth: Payer: Self-pay | Admitting: Family Medicine

## 2014-07-09 NOTE — Telephone Encounter (Signed)
Noted, I will mail the results once they come back.     KP

## 2014-07-09 NOTE — Telephone Encounter (Signed)
Caller name:Dakoda Herbie BaltimoreHarding Relationship to patient:self   Reason for call: PT requesting lab results be sent in mail- does not have computer available. Labs drawn yesterday.

## 2014-07-10 LAB — URINE CULTURE
Colony Count: NO GROWTH
Organism ID, Bacteria: NO GROWTH

## 2014-07-15 ENCOUNTER — Ambulatory Visit: Payer: Medicare Other | Admitting: Family Medicine

## 2014-07-22 ENCOUNTER — Ambulatory Visit: Payer: Medicare Other | Admitting: Family Medicine

## 2014-07-27 ENCOUNTER — Encounter: Payer: Self-pay | Admitting: Family Medicine

## 2014-07-27 ENCOUNTER — Ambulatory Visit (INDEPENDENT_AMBULATORY_CARE_PROVIDER_SITE_OTHER): Payer: Medicare Other | Admitting: Family Medicine

## 2014-07-27 VITALS — BP 130/78 | HR 60 | Temp 97.9°F | Wt 143.8 lb

## 2014-07-27 DIAGNOSIS — E785 Hyperlipidemia, unspecified: Secondary | ICD-10-CM | POA: Diagnosis not present

## 2014-07-27 DIAGNOSIS — E119 Type 2 diabetes mellitus without complications: Secondary | ICD-10-CM

## 2014-07-27 DIAGNOSIS — I10 Essential (primary) hypertension: Secondary | ICD-10-CM

## 2014-07-27 DIAGNOSIS — F411 Generalized anxiety disorder: Secondary | ICD-10-CM

## 2014-07-27 DIAGNOSIS — Z23 Encounter for immunization: Secondary | ICD-10-CM | POA: Diagnosis not present

## 2014-07-27 MED ORDER — SIMVASTATIN 20 MG PO TABS: 20.0000 mg | ORAL_TABLET | Freq: Every day | ORAL | Status: DC

## 2014-07-27 MED ORDER — TRIAMTERENE-HCTZ 75-50 MG PO TABS: 0.5000 | ORAL_TABLET | Freq: Every day | ORAL | Status: DC

## 2014-07-27 MED ORDER — POTASSIUM CHLORIDE CRYS ER 20 MEQ PO TBCR: EXTENDED_RELEASE_TABLET | ORAL | Status: DC

## 2014-07-27 MED ORDER — PNEUMOCOCCAL 13-VAL CONJ VACC IM SUSP: 0.5000 mL | INTRAMUSCULAR | Status: DC

## 2014-07-27 MED ORDER — SERTRALINE HCL 50 MG PO TABS: 50.0000 mg | ORAL_TABLET | Freq: Every day | ORAL | Status: DC

## 2014-07-27 MED ORDER — LOSARTAN POTASSIUM 100 MG PO TABS
ORAL_TABLET | ORAL | Status: DC
Start: 2014-07-27 — End: 2015-09-06

## 2014-07-27 MED ORDER — ATENOLOL 50 MG PO TABS: ORAL_TABLET | ORAL | Status: DC

## 2014-07-27 NOTE — Progress Notes (Signed)
Pre visit review using our clinic review tool, if applicable. No additional management support is needed unless otherwise documented below in the visit note. 

## 2014-07-27 NOTE — Patient Instructions (Signed)

## 2014-07-27 NOTE — Assessment & Plan Note (Addendum)
Stable con't atenolol , maxzide, cozaar

## 2014-07-27 NOTE — Progress Notes (Signed)
Patient ID: [name], female    DOB: [birth year]  Age: 80 y.o. MRN: [id number]    Subjective:  Subjective HPI Kathryn PonsCaroline M Catapano presents for f/u dm, htn and cholesterol.  HPI HYPERTENSION  Blood pressure range-not checking   Chest pain- no      Dyspnea- no Lightheadedness- no   Edema- no Other side effects - no   Medication compliance: good Low salt diet- yes  DIABETES  Blood Sugar ranges-not checking  Polyuria- no New Visual problems- no Hypoglycemic symptoms- no Other side effects-no Medication compliance - good Last eye exam-01/2014 Foot exam- today  HYPERLIPIDEMIA  Medication compliance- good RUQ pain- no  Muscle aches- no Other side effects-no      Review of Systems  Constitutional: Negative for diaphoresis, appetite change, fatigue and unexpected weight change.  Eyes: Negative for pain, redness and visual disturbance.  Respiratory: Negative for cough, chest tightness, shortness of breath and wheezing.   Cardiovascular: Negative for chest pain, palpitations and leg swelling.  Endocrine: Negative for cold intolerance, heat intolerance, polydipsia, polyphagia and polyuria.  Genitourinary: Negative for dysuria, frequency and difficulty urinating.  Neurological: Negative for dizziness, light-headedness, numbness and headaches.  Psychiatric/Behavioral: Negative for confusion and decreased concentration. The patient is not nervous/anxious.     History Past Medical History  Diagnosis Date  . Diabetes mellitus     type 2  . Hypertension   . Hyperlipidemia   . Thyroid nodule   . Abscess, rectum   . Fatty liver disease, nonalcoholic     She has past surgical history that includes Abdominal hysterectomy; Anal fissure repair; Tonsillectomy and adenoidectomy; Hemorrhoid surgery; Cataract extraction, bilateral (Feb); and Eye surgery (06/2013).   Her family history includes Diabetes in her brother, mother, and another family member; Hypertension in her  brother, mother, sister, and another family member; Mental retardation in her sister; Other in her brother.She reports that she has never smoked. She has never used smokeless tobacco. She reports that she does not drink alcohol or use illicit drugs.  Current Outpatient Prescriptions on File Prior to Visit  Medication Sig Dispense Refill  . aspirin 81 MG tablet Take 81 mg by mouth daily.      . diazepam (VALIUM) 5 MG tablet TAKE 1 TABLET BY MOUTH EVERY DAY 45 tablet 2  . glucose blood (FREESTYLE TEST STRIPS) test strip Check Blood sugar once daily. DX 250.00 100 each 1  . Lancets (FREESTYLE) lancets 1 each by Other route as needed. Use as instructed      No current facility-administered medications on file prior to visit.     Objective:  Objective Physical Exam  Constitutional: She is oriented to person, place, and time. She appears well-developed and well-nourished.  HENT:  Head: Normocephalic and atraumatic.  Eyes: Conjunctivae and EOM are normal.  Neck: Normal range of motion. Neck supple. No JVD present. Carotid bruit is not present. No thyromegaly present.  Cardiovascular: Normal rate, regular rhythm and normal heart sounds.   No murmur heard. Pulmonary/Chest: Effort normal and breath sounds normal. No respiratory distress. She has no wheezes. She has no rales. She exhibits no tenderness.  Musculoskeletal: She exhibits no edema.  Neurological: She is alert and oriented to person, place, and time.  Psychiatric: She has a normal mood and affect. Her behavior is normal. Judgment and thought content normal.  Nursing note and vitals reviewed. Sensory exam of the foot is normal, tested with the monofilament. Good pulses, no lesions or ulcers, good peripheral pulses.  BP 130/78 mmHg  Pulse 60  Temp(Src) 97.9 F (36.6 C) (Oral)  Wt 143 lb 12.8 oz (65.227 kg)  SpO2 97% Wt Readings from Last 3 Encounters:  07/27/14 143 lb 12.8 oz (65.227 kg)  10/13/13 148 lb 12.8 oz (67.495 kg)    11/06/12 147 lb 9.6 oz (66.951 kg)     Lab Results  Component Value Date   WBC 5.6 10/13/2013   HGB 14.6 10/13/2013   HCT 43.7 10/13/2013   PLT 213.0 10/13/2013   GLUCOSE 117* 07/08/2014   CHOL 150 07/08/2014   TRIG 85.0 07/08/2014   HDL 69.60 07/08/2014   LDLCALC 63 07/08/2014   ALT 19 07/08/2014   AST 19 07/08/2014   NA 139 07/08/2014   K 3.5 07/08/2014   CL 104 07/08/2014   CREATININE 1.06 07/08/2014   BUN 24* 07/08/2014   CO2 27 07/08/2014   TSH 3.46 02/03/2008   INR 1.04 06/02/2010   HGBA1C 6.1 07/08/2014   MICROALBUR 0.2 04/21/2012    Dg Wrist Complete Left  09/01/2011   *RADIOLOGY REPORT*  Clinical Data: Fall  LEFT WRIST - COMPLETE 3+ VIEW  Comparison: None.  Findings: There is subtle lucency in the distal radius articular surface worrisome for a nondisplaced fracture.  This is best seen on the oblique view.  A small bone fragment projects over the dorsal distal radius which may be involved in the fracture.  Subtle ulnar styloid fracture is also suspected. It is favored to be an old fracture.  Mild degenerative changes.  Mild osteopenia.  IMPRESSION: Findings are worrisome for a subtle intra-articular fracture of the distal radius.  Original Report Authenticated By: Donavan Burnet, M.D.    Assessment & Plan:  Plan I have discontinued Ms. Funderburg's tiZANidine. I have also changed her triamterene-hydrochlorothiazide. Additionally, I am having her start on pneumococcal 13-valent conjugate vaccine. Lastly, I am having her maintain her aspirin, freestyle, glucose blood, diazepam, atenolol, losartan, potassium chloride SA, sertraline, and simvastatin.  Meds ordered this encounter  Medications  . atenolol (TENORMIN) 50 MG tablet    Sig: 1 tab by mouth daily--    Dispense:  90 tablet    Refill:  3  . losartan (COZAAR) 100 MG tablet    Sig: TAKE 1 TABLET BY MOUTH DAILY    Dispense:  90 tablet    Refill:  3  . potassium chloride SA (K-DUR,KLOR-CON) 20 MEQ tablet    Sig:  TAKE 1 TABLET BY MOUTH ONCE DAILY    Dispense:  90 tablet    Refill:  3  . sertraline (ZOLOFT) 50 MG tablet    Sig: Take 1 tablet (50 mg total) by mouth daily.    Dispense:  90 tablet    Refill:  3  . simvastatin (ZOCOR) 20 MG tablet    Sig: Take 1 tablet (20 mg total) by mouth at bedtime.    Dispense:  90 tablet    Refill:  3  . triamterene-hydrochlorothiazide (MAXZIDE) 75-50 MG per tablet    Sig: Take 0.5 tablets by mouth daily.    Dispense:  45 tablet    Refill:  3  . pneumococcal 13-valent conjugate vaccine (PREVNAR 13) SUSP injection    Sig: Inject 0.5 mLs into the muscle tomorrow at 10 am.    Dispense:  0.5 mL    Refill:  0    Problem List Items Addressed This Visit    Hyperlipidemia    con't diet and exercise con't zocor Labs reviewed Lab Results  Component Value Date   CHOL 150 07/08/2014   CHOL 146 10/13/2013   CHOL 144 10/21/2012   Lab Results  Component Value Date   HDL 69.60 07/08/2014   HDL 67.90 10/13/2013   HDL 64.70 10/21/2012   Lab Results  Component Value Date   LDLCALC 63 07/08/2014   LDLCALC 66 10/13/2013   LDLCALC 61 10/21/2012   Lab Results  Component Value Date   TRIG 85.0 07/08/2014   TRIG 61.0 10/13/2013   TRIG 91.0 10/21/2012   Lab Results  Component Value Date   CHOLHDL 2 07/08/2014   CHOLHDL 2 10/13/2013   CHOLHDL 2 10/21/2012   No results found for: LDLDIRECT       Relevant Medications   atenolol (TENORMIN) tablet   losartan (COZAAR) tablet   simvastatin (ZOCOR) tablet   triamterene-hydrochlorothiazide (MAXZIDE) 75-50 MG per tablet   Other Relevant Orders   Hepatic function panel   Lipid panel   Essential hypertension - Primary    Stable con't atenolol , maxzide, cozaar      Relevant Medications   atenolol (TENORMIN) tablet   losartan (COZAAR) tablet   potassium chloride SA (K-DUR,KLOR-CON) CR tablet   simvastatin (ZOCOR) tablet   triamterene-hydrochlorothiazide (MAXZIDE) 75-50 MG per tablet   Other Relevant  Orders   Basic metabolic panel   CBC with Differential/Platelet   Diabetes type 2, controlled    Diet controlled Labs reviewed      Relevant Medications   losartan (COZAAR) tablet   simvastatin (ZOCOR) tablet    Other Visit Diagnoses    Generalized anxiety disorder        Relevant Medications    sertraline (ZOLOFT) tablet    Need for prophylactic vaccination against Streptococcus pneumoniae (pneumococcus)        Relevant Medications    pneumococcal 13-valent conjugate vaccine (PREVNAR 13) SUSP injection    Diabetes mellitus type II, controlled        Relevant Medications    losartan (COZAAR) tablet    simvastatin (ZOCOR) tablet    Other Relevant Orders    Basic metabolic panel    Hemoglobin A1c    Microalbumin / creatinine urine ratio    POCT urinalysis dipstick       Follow-up: Return in about 6 months (around 01/27/2015), or if symptoms worsen or fail to improve, for cpe.  Loreen Freud, DO

## 2014-07-27 NOTE — Assessment & Plan Note (Signed)
Diet controlled  Labs reviewed

## 2014-07-27 NOTE — Assessment & Plan Note (Addendum)
con't diet and exercise con't zocor Labs reviewed Lab Results  Component Value Date   CHOL 150 07/08/2014   CHOL 146 10/13/2013   CHOL 144 10/21/2012   Lab Results  Component Value Date   HDL 69.60 07/08/2014   HDL 67.90 10/13/2013   HDL 64.70 10/21/2012   Lab Results  Component Value Date   LDLCALC 63 07/08/2014   LDLCALC 66 10/13/2013   LDLCALC 61 10/21/2012   Lab Results  Component Value Date   TRIG 85.0 07/08/2014   TRIG 61.0 10/13/2013   TRIG 91.0 10/21/2012   Lab Results  Component Value Date   CHOLHDL 2 07/08/2014   CHOLHDL 2 10/13/2013   CHOLHDL 2 10/21/2012   No results found for: LDLDIRECT

## 2014-10-07 ENCOUNTER — Other Ambulatory Visit: Payer: Self-pay | Admitting: Family Medicine

## 2014-10-07 NOTE — Telephone Encounter (Signed)
Last seen 07/27/14 and filled 10/13/13 #45 with 2 rf   Please advise     KP

## 2014-11-09 ENCOUNTER — Other Ambulatory Visit: Payer: Self-pay | Admitting: Family Medicine

## 2014-11-09 NOTE — Telephone Encounter (Signed)
Advised pharmacy that new Rx was sent in March.  On file, they will fill.

## 2015-01-05 DIAGNOSIS — H04123 Dry eye syndrome of bilateral lacrimal glands: Secondary | ICD-10-CM | POA: Diagnosis not present

## 2015-01-18 ENCOUNTER — Other Ambulatory Visit: Payer: Self-pay | Admitting: Family Medicine

## 2015-01-19 NOTE — Telephone Encounter (Signed)
Pt due now for 6 month f/u.  No UDS since 2014. Last refill 10/07/14.  Rx printed and forwarded to PCP for signature.   Name from pharmacy:  In chart as:  DIAZEPAM  TABLETS diazepam (VALIUM) 5 MG tablet     Sig: TAKE 1 TABLET BY MOUTH DAILY    Dispense: 45 tablet   Refills: 0   Start: 01/18/2015   Class: Normal    Requested on: 01/18/2015    Originally ordered on: 08/02/2010 10/07/2014

## 2015-01-27 ENCOUNTER — Other Ambulatory Visit (INDEPENDENT_AMBULATORY_CARE_PROVIDER_SITE_OTHER): Payer: Medicare Other

## 2015-01-27 DIAGNOSIS — E785 Hyperlipidemia, unspecified: Secondary | ICD-10-CM

## 2015-01-27 DIAGNOSIS — E119 Type 2 diabetes mellitus without complications: Secondary | ICD-10-CM

## 2015-01-27 DIAGNOSIS — I10 Essential (primary) hypertension: Secondary | ICD-10-CM

## 2015-01-27 LAB — POCT URINALYSIS DIPSTICK
BILIRUBIN UA: NEGATIVE
Blood, UA: NEGATIVE
Glucose, UA: NEGATIVE
Ketones, UA: NEGATIVE
LEUKOCYTES UA: NEGATIVE
NITRITE UA: NEGATIVE
PH UA: 6
PROTEIN UA: NEGATIVE
Spec Grav, UA: 1.025
Urobilinogen, UA: NEGATIVE

## 2015-01-27 LAB — CBC WITH DIFFERENTIAL/PLATELET
BASOS ABS: 0.1 10*3/uL (ref 0.0–0.1)
Basophils Relative: 0.9 % (ref 0.0–3.0)
EOS ABS: 0.1 10*3/uL (ref 0.0–0.7)
Eosinophils Relative: 2.2 % (ref 0.0–5.0)
HCT: 45.5 % (ref 36.0–46.0)
Hemoglobin: 15.3 g/dL — ABNORMAL HIGH (ref 12.0–15.0)
LYMPHS ABS: 2.4 10*3/uL (ref 0.7–4.0)
Lymphocytes Relative: 40.5 % (ref 12.0–46.0)
MCHC: 33.7 g/dL (ref 30.0–36.0)
MCV: 85 fl (ref 78.0–100.0)
MONOS PCT: 9 % (ref 3.0–12.0)
Monocytes Absolute: 0.5 10*3/uL (ref 0.1–1.0)
NEUTROS ABS: 2.8 10*3/uL (ref 1.4–7.7)
NEUTROS PCT: 47.4 % (ref 43.0–77.0)
PLATELETS: 222 10*3/uL (ref 150.0–400.0)
RBC: 5.35 Mil/uL — ABNORMAL HIGH (ref 3.87–5.11)
RDW: 13.8 % (ref 11.5–15.5)
WBC: 6 10*3/uL (ref 4.0–10.5)

## 2015-01-27 LAB — HEMOGLOBIN A1C: Hgb A1c MFr Bld: 6 % (ref 4.6–6.5)

## 2015-01-27 LAB — HEPATIC FUNCTION PANEL
ALT: 18 U/L (ref 0–35)
AST: 19 U/L (ref 0–37)
Albumin: 4.4 g/dL (ref 3.5–5.2)
Alkaline Phosphatase: 62 U/L (ref 39–117)
BILIRUBIN DIRECT: 0.2 mg/dL (ref 0.0–0.3)
TOTAL PROTEIN: 7.4 g/dL (ref 6.0–8.3)
Total Bilirubin: 1 mg/dL (ref 0.2–1.2)

## 2015-01-27 LAB — LIPID PANEL
CHOL/HDL RATIO: 2
Cholesterol: 148 mg/dL (ref 0–200)
HDL: 64.7 mg/dL (ref >39.00)
LDL Cholesterol: 66 mg/dL (ref 0–99)
NonHDL: 83.66
TRIGLYCERIDES: 87 mg/dL (ref 0.0–149.0)
VLDL: 17.4 mg/dL (ref 0.0–40.0)

## 2015-01-27 LAB — BASIC METABOLIC PANEL
BUN: 20 mg/dL (ref 6–23)
CALCIUM: 9.8 mg/dL (ref 8.4–10.5)
CO2: 27 mEq/L (ref 19–32)
Chloride: 103 mEq/L (ref 96–112)
Creatinine, Ser: 1.03 mg/dL (ref 0.40–1.20)
GFR: 55.04 mL/min — AB (ref >60.00)
GLUCOSE: 115 mg/dL — AB (ref 70–99)
Potassium: 3.7 mEq/L (ref 3.5–5.1)
Sodium: 141 mEq/L (ref 135–145)

## 2015-01-27 LAB — MICROALBUMIN / CREATININE URINE RATIO
Creatinine,U: 73.6 mg/dL
MICROALB/CREAT RATIO: 1 mg/g (ref 0.0–30.0)
Microalb, Ur: <0.7 mg/dL (ref 0.0–1.9)

## 2015-01-31 ENCOUNTER — Encounter: Payer: Self-pay | Admitting: *Deleted

## 2015-02-02 ENCOUNTER — Encounter: Payer: Self-pay | Admitting: *Deleted

## 2015-02-02 ENCOUNTER — Telehealth: Payer: Self-pay | Admitting: *Deleted

## 2015-02-02 NOTE — Telephone Encounter (Signed)
Pre-Visit Call completed with patient and chart updated.   Pre-Visit Info documented in Specialty Comments under [employer].    

## 2015-02-03 ENCOUNTER — Encounter: Payer: Self-pay | Admitting: Family Medicine

## 2015-02-03 ENCOUNTER — Ambulatory Visit (INDEPENDENT_AMBULATORY_CARE_PROVIDER_SITE_OTHER): Payer: Medicare Other | Admitting: Family Medicine

## 2015-02-03 VITALS — BP 122/80 | HR 52 | Temp 97.7°F | Ht 62.5 in | Wt 145.0 lb

## 2015-02-03 DIAGNOSIS — E785 Hyperlipidemia, unspecified: Secondary | ICD-10-CM

## 2015-02-03 DIAGNOSIS — B354 Tinea corporis: Secondary | ICD-10-CM

## 2015-02-03 DIAGNOSIS — I10 Essential (primary) hypertension: Secondary | ICD-10-CM

## 2015-02-03 DIAGNOSIS — E2839 Other primary ovarian failure: Secondary | ICD-10-CM

## 2015-02-03 DIAGNOSIS — Z0000 Encounter for general adult medical examination without abnormal findings: Secondary | ICD-10-CM

## 2015-02-03 DIAGNOSIS — Z23 Encounter for immunization: Secondary | ICD-10-CM

## 2015-02-03 MED ORDER — POTASSIUM CHLORIDE CRYS ER 20 MEQ PO TBCR: EXTENDED_RELEASE_TABLET | ORAL | Status: DC

## 2015-02-03 MED ORDER — LOSARTAN POTASSIUM 100 MG PO TABS: ORAL_TABLET | ORAL | Status: DC

## 2015-02-03 MED ORDER — TRIAMTERENE-HCTZ 75-50 MG PO TABS: 0.5000 | ORAL_TABLET | Freq: Every day | ORAL | Status: DC

## 2015-02-03 MED ORDER — ATENOLOL 50 MG PO TABS: ORAL_TABLET | ORAL | Status: DC

## 2015-02-03 MED ORDER — SIMVASTATIN 20 MG PO TABS
20.0000 mg | ORAL_TABLET | Freq: Every day | ORAL | Status: DC
Start: 2015-02-03 — End: 2015-08-12

## 2015-02-03 MED ORDER — NYSTATIN 100000 UNIT/GM EX OINT: 1.0000 "application " | TOPICAL_OINTMENT | Freq: Two times a day (BID) | CUTANEOUS | Status: DC

## 2015-02-03 NOTE — Progress Notes (Signed)
Subjective:   [name] is a 80 y.o. female who presents for Medicare Annual (Subsequent) preventive examination.  Review of Systems:   Review of Systems  Constitutional: Negative for activity change, appetite change and fatigue.  HENT: Negative for hearing loss, congestion, tinnitus and ear discharge.   Eyes: Negative for visual disturbance (see optho q1y -- vision corrected to 20/20 with glasses).  Respiratory: Negative for cough, chest tightness and shortness of breath.   Cardiovascular: Negative for chest pain, palpitations and leg swelling.  Gastrointestinal: Negative for abdominal pain, diarrhea, constipation and abdominal distention.  Genitourinary: Negative for urgency, frequency, decreased urine volume and difficulty urinating.  Musculoskeletal: Negative for back pain, arthralgias and gait problem.  Skin: Negative for color change, pallor and rash.  Neurological: Negative for dizziness, light-headedness, numbness and headaches.  Hematological: Negative for adenopathy. Does not bruise/bleed easily.  Psychiatric/Behavioral: Negative for suicidal ideas, confusion, sleep disturbance, self-injury, dysphoric mood, decreased concentration and agitation.  Pt is able to read and write and can do all ADLs No risk for falling No abuse/ violence in home          Objective:     Vitals: BP 122/80 mmHg  Pulse 52  Temp(Src) 97.7 F (36.5 C) (Oral)  Ht 5' 2.5" (1.588 m)  Wt 145 lb (65.772 kg)  BMI 26.08 kg/m2  SpO2 98% BP 122/80 mmHg  Pulse 52  Temp(Src) 97.7 F (36.5 C) (Oral)  Ht 5' 2.5" (1.588 m)  Wt 145 lb (65.772 kg)  BMI 26.08 kg/m2  SpO2 98% General appearance: alert, cooperative, appears stated age and no distress Head: Normocephalic, without obvious abnormality, atraumatic Eyes: negative findings: lids and lashes normal and pupils equal, round, reactive to light and accomodation Ears: normal TM's and external ear canals both ears Nose: Nares normal.  Septum midline. Mucosa normal. No drainage or sinus tenderness. Throat: lips, mucosa, and tongue normal; teeth and gums normal Neck: no adenopathy, no carotid bruit, no JVD, supple, symmetrical, trachea midline and thyroid not enlarged, symmetric, no tenderness/mass/nodules Back: symmetric, no curvature. ROM normal. No CVA tenderness. Lungs: clear to auscultation bilaterally Breasts: normal appearance, no masses or tenderness Heart: regular rate and rhythm, S1, S2 normal, no murmur, click, rub or gallop Abdomen: soft, non-tender; bowel sounds normal; no masses,  no organomegaly Pelvic: not indicated; post-menopausal, no abnormal Pap smears in past Extremities: extremities normal, atraumatic, no cyanosis or edema Pulses: 2+ and symmetric Skin: Skin color, texture, turgor normal. No rashes or lesions Lymph nodes: Cervical, supraclavicular, and axillary nodes normal. Neurologic: Alert and oriented X 3, normal strength and tone. Normal symmetric reflexes. Normal coordination and gait Psych-- no depression, no anxiety Tobacco History  Smoking status  . Never Smoker   Smokeless tobacco  . Never Used     Counseling given: Not Answered   Past Medical History  Diagnosis Date  . Diabetes mellitus     type 2  . Hypertension   . Hyperlipidemia   . Thyroid nodule   . Abscess, rectum   . Fatty liver disease, nonalcoholic    Past Surgical History  Procedure Laterality Date  . Abdominal hysterectomy    . Anal fissure repair    . Tonsillectomy and adenoidectomy    . Hemorrhoid surgery    . Cataract extraction, bilateral  Feb  . Eye surgery  06/2013    cataract b/l   Family History  Problem Relation Age of Onset  . Diabetes    . Hypertension    .  Other Brother     Rhabdo secondary to Statin  . Diabetes Brother   . Hypertension Brother   . Diabetes Mother   . Hypertension Mother   . Hypertension Sister   . Mental retardation Sister    History  Sexual Activity  . Sexual  Activity:  . Partners: Male    Outpatient Encounter Prescriptions as of 02/03/2015  Medication Sig  . aspirin 81 MG tablet Take 81 mg by mouth daily.    Marland Kitchen atenolol (TENORMIN) 50 MG tablet 1 tab by mouth daily--  . diazepam (VALIUM) 5 MG tablet TAKE 1 TABLET BY MOUTH DAILY  . losartan (COZAAR) 100 MG tablet TAKE 1 TABLET BY MOUTH DAILY  . pneumococcal 13-valent conjugate vaccine (PREVNAR 13) SUSP injection Inject 0.5 mLs into the muscle tomorrow at 10 am.  . potassium chloride SA (K-DUR,KLOR-CON) 20 MEQ tablet TAKE 1 TABLET BY MOUTH ONCE DAILY  . simvastatin (ZOCOR) 20 MG tablet Take 1 tablet (20 mg total) by mouth at bedtime.  . triamterene-hydrochlorothiazide (MAXZIDE) 75-50 MG tablet Take 0.5 tablets by mouth daily.  . [DISCONTINUED] atenolol (TENORMIN) 50 MG tablet 1 tab by mouth daily--  . [DISCONTINUED] glucose blood (FREESTYLE TEST STRIPS) test strip Check Blood sugar once daily. DX 250.00  . [DISCONTINUED] Lancets (FREESTYLE) lancets 1 each by Other route as needed. Use as instructed   . [DISCONTINUED] losartan (COZAAR) 100 MG tablet TAKE 1 TABLET BY MOUTH DAILY  . [DISCONTINUED] potassium chloride SA (K-DUR,KLOR-CON) 20 MEQ tablet TAKE 1 TABLET BY MOUTH ONCE DAILY  . [DISCONTINUED] simvastatin (ZOCOR) 20 MG tablet Take 1 tablet (20 mg total) by mouth at bedtime.  . [DISCONTINUED] triamterene-hydrochlorothiazide (MAXZIDE) 75-50 MG per tablet Take 0.5 tablets by mouth daily.  Marland Kitchen nystatin ointment (MYCOSTATIN) Apply 1 application topically 2 (two) times daily.  . [DISCONTINUED] sertraline (ZOLOFT) 50 MG tablet Take 1 tablet (50 mg total) by mouth daily. (Patient not taking: Reported on 02/03/2015)   No facility-administered encounter medications on file as of 02/03/2015.    Activities of Daily Living In your present state of health, do you have any difficulty performing the following activities: 02/03/2015 02/03/2015  Hearing? N N  Vision? N N  Difficulty concentrating or making  decisions? N N  Walking or climbing stairs? N N  Dressing or bathing? N N  Doing errands, shopping? N N    Patient Care Team: Lelon Perla, DO as PCP - General Nelson Chimes, MD as Consulting Physician (Ophthalmology)    Assessment:    cpe Exercise Activities and Dietary recommendations-- con't walking     Goals    None     Fall Risk Fall Risk  02/03/2015 02/03/2015 10/13/2013 03/24/2012  Falls in the past year? No No No Yes  Number falls in past yr: - - - 1  Injury with Fall? - - - Yes  Risk Factor Category  - - - High Fall Risk   Depression Screen PHQ 2/9 Scores 02/03/2015 02/03/2015 10/13/2013 03/24/2012  PHQ - 2 Score 0 0 0 1     Cognitive Testing mmse 30/30  Immunization History  Administered Date(s) Administered  . Influenza Split 02/09/2011, 03/06/2012  . Influenza Whole 02/24/2008  . Pneumococcal Polysaccharide-23 05/17/2004  . Td 05/17/2004   Screening Tests Health Maintenance  Topic Date Due  . PNA vac Low Risk Adult (2 of 2 - PCV13) 05/17/2005  . TETANUS/TDAP  05/17/2014  . FOOT EXAM  10/14/2014  . INFLUENZA VACCINE  07/27/2015 (Originally 12/06/2014)  .  HEMOGLOBIN A1C  07/27/2015  . OPHTHALMOLOGY EXAM  12/29/2015  . DEXA SCAN  Completed  . ZOSTAVAX  Addressed      Plan:   see avs During the course of the visit the patient was educated and counseled about the following appropriate screening and preventive services:   Vaccines to include Pneumoccal, Influenza, Hepatitis B, Td, Zostavax, HCV  Electrocardiogram  Cardiovascular Disease  Colorectal cancer screening  Bone density screening  Diabetes screening  Glaucoma screening  Mammography/PAP  Nutrition counseling   Patient Instructions (the written plan) was given to the patient.  1. Essential hypertension stable - atenolol (TENORMIN) 50 MG tablet; 1 tab by mouth daily--  Dispense: 90 tablet; Refill: 3 - losartan (COZAAR) 100 MG tablet; TAKE 1 TABLET BY MOUTH DAILY  Dispense: 90 tablet;  Refill: 3 - potassium chloride SA (K-DUR,KLOR-CON) 20 MEQ tablet; TAKE 1 TABLET BY MOUTH ONCE DAILY  Dispense: 90 tablet; Refill: 3 - triamterene-hydrochlorothiazide (MAXZIDE) 75-50 MG tablet; Take 0.5 tablets by mouth daily.  Dispense: 45 tablet; Refill: 3  2. Hyperlipidemia  - simvastatin (ZOCOR) 20 MG tablet; Take 1 tablet (20 mg total) by mouth at bedtime.  Dispense: 90 tablet; Refill: 3  3. Medicare annual wellness visit, subsequent    4. Estrogen deficiency    - DG Bone Density; Future  5. Tinea corporis    - nystatin ointment (MYCOSTATIN); Apply 1 application topically 2 (two) times daily.  Dispense: 30 g; Refill: 0  6. Routine history and physical examination of adult    7. Need for pneumococcal vaccine   - Pneumococcal conjugate vaccine 13-valent She wants to wait for the flu shot  Loreen Freud, DO  02/03/2015

## 2015-02-03 NOTE — Patient Instructions (Signed)
Preventive Care for Adults A healthy lifestyle and preventive care can promote health and wellness. Preventive health guidelines for women include the following key practices.  A routine yearly physical is a good way to check with your health care provider about your health and preventive screening. It is a chance to share any concerns and updates on your health and to receive a thorough exam.  Visit your dentist for a routine exam and preventive care every 6 months. Brush your teeth twice a day and floss once a day. Good oral hygiene prevents tooth decay and gum disease.  The frequency of eye exams is based on your age, health, family medical history, use of contact lenses, and other factors. Follow your health care provider's recommendations for frequency of eye exams.  Eat a healthy diet. Foods like vegetables, fruits, whole grains, low-fat dairy products, and lean protein foods contain the nutrients you need without too many calories. Decrease your intake of foods high in solid fats, added sugars, and salt. Eat the right amount of calories for you.Get information about a proper diet from your health care provider, if necessary.  Regular physical exercise is one of the most important things you can do for your health. Most adults should get at least 150 minutes of moderate-intensity exercise (any activity that increases your heart rate and causes you to sweat) each week. In addition, most adults need muscle-strengthening exercises on 2 or more days a week.  Maintain a healthy weight. The body mass index (BMI) is a screening tool to identify possible weight problems. It provides an estimate of body fat based on height and weight. Your health care provider can find your BMI and can help you achieve or maintain a healthy weight.For adults 20 years and older:  A BMI below 18.5 is considered underweight.  A BMI of 18.5 to 24.9 is normal.  A BMI of 25 to 29.9 is considered overweight.  A BMI of  30 and above is considered obese.  Maintain normal blood lipids and cholesterol levels by exercising and minimizing your intake of saturated fat. Eat a balanced diet with plenty of fruit and vegetables. Blood tests for lipids and cholesterol should begin at age 76 and be repeated every 5 years. If your lipid or cholesterol levels are high, you are over 50, or you are at high risk for heart disease, you may need your cholesterol levels checked more frequently.Ongoing high lipid and cholesterol levels should be treated with medicines if diet and exercise are not working.  If you smoke, find out from your health care provider how to quit. If you do not use tobacco, do not start.  Lung cancer screening is recommended for adults aged 22-80 years who are at high risk for developing lung cancer because of a history of smoking. A yearly low-dose CT scan of the lungs is recommended for people who have at least a 30-pack-year history of smoking and are a current smoker or have quit within the past 15 years. A pack year of smoking is smoking an average of 1 pack of cigarettes a day for 1 year (for example: 1 pack a day for 30 years or 2 packs a day for 15 years). Yearly screening should continue until the smoker has stopped smoking for at least 15 years. Yearly screening should be stopped for people who develop a health problem that would prevent them from having lung cancer treatment.  If you are pregnant, do not drink alcohol. If you are breastfeeding,  be very cautious about drinking alcohol. If you are not pregnant and choose to drink alcohol, do not have more than 1 drink per day. One drink is considered to be 12 ounces (355 mL) of beer, 5 ounces (148 mL) of wine, or 1.5 ounces (44 mL) of liquor.  Avoid use of street drugs. Do not share needles with anyone. Ask for help if you need support or instructions about stopping the use of drugs.  High blood pressure causes heart disease and increases the risk of  stroke. Your blood pressure should be checked at least every 1 to 2 years. Ongoing high blood pressure should be treated with medicines if weight loss and exercise do not work.  If you are 75-52 years old, ask your health care provider if you should take aspirin to prevent strokes.  Diabetes screening involves taking a blood sample to check your fasting blood sugar level. This should be done once every 3 years, after age 15, if you are within normal weight and without risk factors for diabetes. Testing should be considered at a younger age or be carried out more frequently if you are overweight and have at least 1 risk factor for diabetes.  Breast cancer screening is essential preventive care for women. You should practice "breast self-awareness." This means understanding the normal appearance and feel of your breasts and may include breast self-examination. Any changes detected, no matter how small, should be reported to a health care provider. Women in their 58s and 30s should have a clinical breast exam (CBE) by a health care provider as part of a regular health exam every 1 to 3 years. After age 16, women should have a CBE every year. Starting at age 53, women should consider having a mammogram (breast X-ray test) every year. Women who have a family history of breast cancer should talk to their health care provider about genetic screening. Women at a high risk of breast cancer should talk to their health care providers about having an MRI and a mammogram every year.  Breast cancer gene (BRCA)-related cancer risk assessment is recommended for women who have family members with BRCA-related cancers. BRCA-related cancers include breast, ovarian, tubal, and peritoneal cancers. Having family members with these cancers may be associated with an increased risk for harmful changes (mutations) in the breast cancer genes BRCA1 and BRCA2. Results of the assessment will determine the need for genetic counseling and  BRCA1 and BRCA2 testing.  Routine pelvic exams to screen for cancer are no longer recommended for nonpregnant women who are considered low risk for cancer of the pelvic organs (ovaries, uterus, and vagina) and who do not have symptoms. Ask your health care provider if a screening pelvic exam is right for you.  If you have had past treatment for cervical cancer or a condition that could lead to cancer, you need Pap tests and screening for cancer for at least 20 years after your treatment. If Pap tests have been discontinued, your risk factors (such as having a new sexual partner) need to be reassessed to determine if screening should be resumed. Some women have medical problems that increase the chance of getting cervical cancer. In these cases, your health care provider may recommend more frequent screening and Pap tests.  The HPV test is an additional test that may be used for cervical cancer screening. The HPV test looks for the virus that can cause the cell changes on the cervix. The cells collected during the Pap test can be  tested for HPV. The HPV test could be used to screen women aged 30 years and older, and should be used in women of any age who have unclear Pap test results. After the age of 30, women should have HPV testing at the same frequency as a Pap test.  Colorectal cancer can be detected and often prevented. Most routine colorectal cancer screening begins at the age of 50 years and continues through age 75 years. However, your health care provider may recommend screening at an earlier age if you have risk factors for colon cancer. On a yearly basis, your health care provider may provide home test kits to check for hidden blood in the stool. Use of a small camera at the end of a tube, to directly examine the colon (sigmoidoscopy or colonoscopy), can detect the earliest forms of colorectal cancer. Talk to your health care provider about this at age 50, when routine screening begins. Direct  exam of the colon should be repeated every 5-10 years through age 75 years, unless early forms of pre-cancerous polyps or small growths are found.  People who are at an increased risk for hepatitis B should be screened for this virus. You are considered at high risk for hepatitis B if:  You were born in a country where hepatitis B occurs often. Talk with your health care provider about which countries are considered high risk.  Your parents were born in a high-risk country and you have not received a shot to protect against hepatitis B (hepatitis B vaccine).  You have HIV or AIDS.  You use needles to inject street drugs.  You live with, or have sex with, someone who has hepatitis B.  You get hemodialysis treatment.  You take certain medicines for conditions like cancer, organ transplantation, and autoimmune conditions.  Hepatitis C blood testing is recommended for all people born from 1945 through 1965 and any individual with known risks for hepatitis C.  Practice safe sex. Use condoms and avoid high-risk sexual practices to reduce the spread of sexually transmitted infections (STIs). STIs include gonorrhea, chlamydia, syphilis, trichomonas, herpes, HPV, and human immunodeficiency virus (HIV). Herpes, HIV, and HPV are viral illnesses that have no cure. They can result in disability, cancer, and death.  You should be screened for sexually transmitted illnesses (STIs) including gonorrhea and chlamydia if:  You are sexually active and are younger than 24 years.  You are older than 24 years and your health care provider tells you that you are at risk for this type of infection.  Your sexual activity has changed since you were last screened and you are at an increased risk for chlamydia or gonorrhea. Ask your health care provider if you are at risk.  If you are at risk of being infected with HIV, it is recommended that you take a prescription medicine daily to prevent HIV infection. This is  called preexposure prophylaxis (PrEP). You are considered at risk if:  You are a heterosexual woman, are sexually active, and are at increased risk for HIV infection.  You take drugs by injection.  You are sexually active with a partner who has HIV.  Talk with your health care provider about whether you are at high risk of being infected with HIV. If you choose to begin PrEP, you should first be tested for HIV. You should then be tested every 3 months for as long as you are taking PrEP.  Osteoporosis is a disease in which the bones lose minerals and strength   with aging. This can result in serious bone fractures or breaks. The risk of osteoporosis can be identified using a bone density scan. Women ages 65 years and over and women at risk for fractures or osteoporosis should discuss screening with their health care providers. Ask your health care provider whether you should take a calcium supplement or vitamin D to reduce the rate of osteoporosis.  Menopause can be associated with physical symptoms and risks. Hormone replacement therapy is available to decrease symptoms and risks. You should talk to your health care provider about whether hormone replacement therapy is right for you.  Use sunscreen. Apply sunscreen liberally and repeatedly throughout the day. You should seek shade when your shadow is shorter than you. Protect yourself by wearing long sleeves, pants, a wide-brimmed hat, and sunglasses year round, whenever you are outdoors.  Once a month, do a whole body skin exam, using a mirror to look at the skin on your back. Tell your health care provider of new moles, moles that have irregular borders, moles that are larger than a pencil eraser, or moles that have changed in shape or color.  Stay current with required vaccines (immunizations).  Influenza vaccine. All adults should be immunized every year.  Tetanus, diphtheria, and acellular pertussis (Td, Tdap) vaccine. Pregnant women should  receive 1 dose of Tdap vaccine during each pregnancy. The dose should be obtained regardless of the length of time since the last dose. Immunization is preferred during the 27th-36th week of gestation. An adult who has not previously received Tdap or who does not know her vaccine status should receive 1 dose of Tdap. This initial dose should be followed by tetanus and diphtheria toxoids (Td) booster doses every 10 years. Adults with an unknown or incomplete history of completing a 3-dose immunization series with Td-containing vaccines should begin or complete a primary immunization series including a Tdap dose. Adults should receive a Td booster every 10 years.  Varicella vaccine. An adult without evidence of immunity to varicella should receive 2 doses or a second dose if she has previously received 1 dose. Pregnant females who do not have evidence of immunity should receive the first dose after pregnancy. This first dose should be obtained before leaving the health care facility. The second dose should be obtained 4-8 weeks after the first dose.  Human papillomavirus (HPV) vaccine. Females aged 13-26 years who have not received the vaccine previously should obtain the 3-dose series. The vaccine is not recommended for use in pregnant females. However, pregnancy testing is not needed before receiving a dose. If a female is found to be pregnant after receiving a dose, no treatment is needed. In that case, the remaining doses should be delayed until after the pregnancy. Immunization is recommended for any person with an immunocompromised condition through the age of 26 years if she did not get any or all doses earlier. During the 3-dose series, the second dose should be obtained 4-8 weeks after the first dose. The third dose should be obtained 24 weeks after the first dose and 16 weeks after the second dose.  Zoster vaccine. One dose is recommended for adults aged 60 years or older unless certain conditions are  present.  Measles, mumps, and rubella (MMR) vaccine. Adults born before 1957 generally are considered immune to measles and mumps. Adults born in 1957 or later should have 1 or more doses of MMR vaccine unless there is a contraindication to the vaccine or there is laboratory evidence of immunity to   each of the three diseases. A routine second dose of MMR vaccine should be obtained at least 28 days after the first dose for students attending postsecondary schools, health care workers, or international travelers. People who received inactivated measles vaccine or an unknown type of measles vaccine during 1963-1967 should receive 2 doses of MMR vaccine. People who received inactivated mumps vaccine or an unknown type of mumps vaccine before 1979 and are at high risk for mumps infection should consider immunization with 2 doses of MMR vaccine. For females of childbearing age, rubella immunity should be determined. If there is no evidence of immunity, females who are not pregnant should be vaccinated. If there is no evidence of immunity, females who are pregnant should delay immunization until after pregnancy. Unvaccinated health care workers born before 1957 who lack laboratory evidence of measles, mumps, or rubella immunity or laboratory confirmation of disease should consider measles and mumps immunization with 2 doses of MMR vaccine or rubella immunization with 1 dose of MMR vaccine.  Pneumococcal 13-valent conjugate (PCV13) vaccine. When indicated, a person who is uncertain of her immunization history and has no record of immunization should receive the PCV13 vaccine. An adult aged 19 years or older who has certain medical conditions and has not been previously immunized should receive 1 dose of PCV13 vaccine. This PCV13 should be followed with a dose of pneumococcal polysaccharide (PPSV23) vaccine. The PPSV23 vaccine dose should be obtained at least 8 weeks after the dose of PCV13 vaccine. An adult aged 19  years or older who has certain medical conditions and previously received 1 or more doses of PPSV23 vaccine should receive 1 dose of PCV13. The PCV13 vaccine dose should be obtained 1 or more years after the last PPSV23 vaccine dose.  Pneumococcal polysaccharide (PPSV23) vaccine. When PCV13 is also indicated, PCV13 should be obtained first. All adults aged 65 years and older should be immunized. An adult younger than age 65 years who has certain medical conditions should be immunized. Any person who resides in a nursing home or long-term care facility should be immunized. An adult smoker should be immunized. People with an immunocompromised condition and certain other conditions should receive both PCV13 and PPSV23 vaccines. People with human immunodeficiency virus (HIV) infection should be immunized as soon as possible after diagnosis. Immunization during chemotherapy or radiation therapy should be avoided. Routine use of PPSV23 vaccine is not recommended for American Indians, [place] Natives, or people younger than 65 years unless there are medical conditions that require PPSV23 vaccine. When indicated, people who have unknown immunization and have no record of immunization should receive PPSV23 vaccine. One-time revaccination 5 years after the first dose of PPSV23 is recommended for people aged 19-64 years who have chronic kidney failure, nephrotic syndrome, asplenia, or immunocompromised conditions. People who received 1-2 doses of PPSV23 before age 65 years should receive another dose of PPSV23 vaccine at age 65 years or later if at least 5 years have passed since the previous dose. Doses of PPSV23 are not needed for people immunized with PPSV23 at or after age 65 years.  Meningococcal vaccine. Adults with asplenia or persistent complement component deficiencies should receive 2 doses of quadrivalent meningococcal conjugate (MenACWY-D) vaccine. The doses should be obtained at least 2 months apart.  Microbiologists working with certain meningococcal bacteria, military recruits, people at risk during an outbreak, and people who travel to or live in countries with a high rate of meningitis should be immunized. A first-year college student up through age   21 years who is living in a residence hall should receive a dose if she did not receive a dose on or after her 16th birthday. Adults who have certain high-risk conditions should receive one or more doses of vaccine.  Hepatitis A vaccine. Adults who wish to be protected from this disease, have certain high-risk conditions, work with hepatitis A-infected animals, work in hepatitis A research labs, or travel to or work in countries with a high rate of hepatitis A should be immunized. Adults who were previously unvaccinated and who anticipate close contact with an international adoptee during the first 60 days after arrival in the [place] from a country with a high rate of hepatitis A should be immunized.  Hepatitis B vaccine. Adults who wish to be protected from this disease, have certain high-risk conditions, may be exposed to blood or other infectious body fluids, are household contacts or sex partners of hepatitis B positive people, are clients or workers in certain care facilities, or travel to or work in countries with a high rate of hepatitis B should be immunized.  Haemophilus influenzae type b (Hib) vaccine. A previously unvaccinated person with asplenia or sickle cell disease or having a scheduled splenectomy should receive 1 dose of Hib vaccine. Regardless of previous immunization, a recipient of a hematopoietic stem cell transplant should receive a 3-dose series 6-12 months after her successful transplant. Hib vaccine is not recommended for adults with HIV infection. Preventive Services / Frequency Ages 64 to 68 years  Blood pressure check.** / Every 1 to 2 years.  Lipid and cholesterol check.** / Every 5 years beginning at age  22.  Clinical breast exam.** / Every 3 years for women in their 88s and 53s.  BRCA-related cancer risk assessment.** / For women who have family members with a BRCA-related cancer (breast, ovarian, tubal, or peritoneal cancers).  Pap test.** / Every 2 years from ages 90 through 51. Every 3 years starting at age 21 through age 56 or 3 with a history of 3 consecutive normal Pap tests.  HPV screening.** / Every 3 years from ages 24 through ages 1 to 46 with a history of 3 consecutive normal Pap tests.  Hepatitis C blood test.** / For any individual with known risks for hepatitis C.  Skin self-exam. / Monthly.  Influenza vaccine. / Every year.  Tetanus, diphtheria, and acellular pertussis (Tdap, Td) vaccine.** / Consult your health care provider. Pregnant women should receive 1 dose of Tdap vaccine during each pregnancy. 1 dose of Td every 10 years.  Varicella vaccine.** / Consult your health care provider. Pregnant females who do not have evidence of immunity should receive the first dose after pregnancy.  HPV vaccine. / 3 doses over 6 months, if 72 and younger. The vaccine is not recommended for use in pregnant females. However, pregnancy testing is not needed before receiving a dose.  Measles, mumps, rubella (MMR) vaccine.** / You need at least 1 dose of MMR if you were born in 1957 or later. You may also need a 2nd dose. For females of childbearing age, rubella immunity should be determined. If there is no evidence of immunity, females who are not pregnant should be vaccinated. If there is no evidence of immunity, females who are pregnant should delay immunization until after pregnancy.  Pneumococcal 13-valent conjugate (PCV13) vaccine.** / Consult your health care provider.  Pneumococcal polysaccharide (PPSV23) vaccine.** / 1 to 2 doses if you smoke cigarettes or if you have certain conditions.  Meningococcal vaccine.** /  1 dose if you are age 19 to 21 years and a first-year college  student living in a residence hall, or have one of several medical conditions, you need to get vaccinated against meningococcal disease. You may also need additional booster doses.  Hepatitis A vaccine.** / Consult your health care provider.  Hepatitis B vaccine.** / Consult your health care provider.  Haemophilus influenzae type b (Hib) vaccine.** / Consult your health care provider. Ages 40 to 64 years  Blood pressure check.** / Every 1 to 2 years.  Lipid and cholesterol check.** / Every 5 years beginning at age 20 years.  Lung cancer screening. / Every year if you are aged 55-80 years and have a 30-pack-year history of smoking and currently smoke or have quit within the past 15 years. Yearly screening is stopped once you have quit smoking for at least 15 years or develop a health problem that would prevent you from having lung cancer treatment.  Clinical breast exam.** / Every year after age 40 years.  BRCA-related cancer risk assessment.** / For women who have family members with a BRCA-related cancer (breast, ovarian, tubal, or peritoneal cancers).  Mammogram.** / Every year beginning at age 40 years and continuing for as long as you are in good health. Consult with your health care provider.  Pap test.** / Every 3 years starting at age 30 years through age 65 or 70 years with a history of 3 consecutive normal Pap tests.  HPV screening.** / Every 3 years from ages 30 years through ages 65 to 70 years with a history of 3 consecutive normal Pap tests.  Fecal occult blood test (FOBT) of stool. / Every year beginning at age 50 years and continuing until age 75 years. You may not need to do this test if you get a colonoscopy every 10 years.  Flexible sigmoidoscopy or colonoscopy.** / Every 5 years for a flexible sigmoidoscopy or every 10 years for a colonoscopy beginning at age 50 years and continuing until age 75 years.  Hepatitis C blood test.** / For all people born from 1945 through  1965 and any individual with known risks for hepatitis C.  Skin self-exam. / Monthly.  Influenza vaccine. / Every year.  Tetanus, diphtheria, and acellular pertussis (Tdap/Td) vaccine.** / Consult your health care provider. Pregnant women should receive 1 dose of Tdap vaccine during each pregnancy. 1 dose of Td every 10 years.  Varicella vaccine.** / Consult your health care provider. Pregnant females who do not have evidence of immunity should receive the first dose after pregnancy.  Zoster vaccine.** / 1 dose for adults aged 60 years or older.  Measles, mumps, rubella (MMR) vaccine.** / You need at least 1 dose of MMR if you were born in 1957 or later. You may also need a 2nd dose. For females of childbearing age, rubella immunity should be determined. If there is no evidence of immunity, females who are not pregnant should be vaccinated. If there is no evidence of immunity, females who are pregnant should delay immunization until after pregnancy.  Pneumococcal 13-valent conjugate (PCV13) vaccine.** / Consult your health care provider.  Pneumococcal polysaccharide (PPSV23) vaccine.** / 1 to 2 doses if you smoke cigarettes or if you have certain conditions.  Meningococcal vaccine.** / Consult your health care provider.  Hepatitis A vaccine.** / Consult your health care provider.  Hepatitis B vaccine.** / Consult your health care provider.  Haemophilus influenzae type b (Hib) vaccine.** / Consult your health care provider. Ages 65   years and over  Blood pressure check.** / Every 1 to 2 years.  Lipid and cholesterol check.** / Every 5 years beginning at age 22 years.  Lung cancer screening. / Every year if you are aged 73-80 years and have a 30-pack-year history of smoking and currently smoke or have quit within the past 15 years. Yearly screening is stopped once you have quit smoking for at least 15 years or develop a health problem that would prevent you from having lung cancer  treatment.  Clinical breast exam.** / Every year after age 4 years.  BRCA-related cancer risk assessment.** / For women who have family members with a BRCA-related cancer (breast, ovarian, tubal, or peritoneal cancers).  Mammogram.** / Every year beginning at age 40 years and continuing for as long as you are in good health. Consult with your health care provider.  Pap test.** / Every 3 years starting at age 9 years through age 34 or 91 years with 3 consecutive normal Pap tests. Testing can be stopped between 65 and 70 years with 3 consecutive normal Pap tests and no abnormal Pap or HPV tests in the past 10 years.  HPV screening.** / Every 3 years from ages 57 years through ages 64 or 45 years with a history of 3 consecutive normal Pap tests. Testing can be stopped between 65 and 70 years with 3 consecutive normal Pap tests and no abnormal Pap or HPV tests in the past 10 years.  Fecal occult blood test (FOBT) of stool. / Every year beginning at age 15 years and continuing until age 17 years. You may not need to do this test if you get a colonoscopy every 10 years.  Flexible sigmoidoscopy or colonoscopy.** / Every 5 years for a flexible sigmoidoscopy or every 10 years for a colonoscopy beginning at age 86 years and continuing until age 71 years.  Hepatitis C blood test.** / For all people born from [birth year] through 1963 and any individual with known risks for hepatitis C.  Osteoporosis screening.** / A one-time screening for women ages 83 years and over and women at risk for fractures or osteoporosis.  Skin self-exam. / Monthly.  Influenza vaccine. / Every year.  Tetanus, diphtheria, and acellular pertussis (Tdap/Td) vaccine.** / 1 dose of Td every 10 years.  Varicella vaccine.** / Consult your health care provider.  Zoster vaccine.** / 1 dose for adults aged 61 years or older.  Pneumococcal 13-valent conjugate (PCV13) vaccine.** / Consult your health care provider.  Pneumococcal  polysaccharide (PPSV23) vaccine.** / 1 dose for all adults aged 28 years and older.  Meningococcal vaccine.** / Consult your health care provider.  Hepatitis A vaccine.** / Consult your health care provider.  Hepatitis B vaccine.** / Consult your health care provider.  Haemophilus influenzae type b (Hib) vaccine.** / Consult your health care provider. ** Family history and personal history of risk and conditions may change your health care provider's recommendations. Document Released: 06/19/2001 Document Revised: 09/07/2013 Document Reviewed: 09/18/2010 [facility] Patient Information 2015 [place], [place]. This information is not intended to replace advice given to you by your health care provider. Make sure you discuss any questions you have with your health care provider.

## 2015-02-03 NOTE — Progress Notes (Signed)
Pre visit review using our clinic review tool, if applicable. No additional management support is needed unless otherwise documented below in the visit note. 

## 2015-02-08 DIAGNOSIS — H04123 Dry eye syndrome of bilateral lacrimal glands: Secondary | ICD-10-CM | POA: Diagnosis not present

## 2015-02-08 DIAGNOSIS — E119 Type 2 diabetes mellitus without complications: Secondary | ICD-10-CM | POA: Diagnosis not present

## 2015-02-08 DIAGNOSIS — H524 Presbyopia: Secondary | ICD-10-CM | POA: Diagnosis not present

## 2015-03-07 ENCOUNTER — Ambulatory Visit (HOSPITAL_BASED_OUTPATIENT_CLINIC_OR_DEPARTMENT_OTHER)
Admission: RE | Admit: 2015-03-07 | Discharge: 2015-03-07 | Disposition: A | Discharge status: Home / Self Care | Payer: Medicare Other | Source: Ambulatory Visit | Attending: Family Medicine | Admitting: Family Medicine

## 2015-03-07 DIAGNOSIS — M8580 Other specified disorders of bone density and structure, unspecified site: Secondary | ICD-10-CM | POA: Diagnosis not present

## 2015-03-07 DIAGNOSIS — Z780 Asymptomatic menopausal state: Secondary | ICD-10-CM | POA: Diagnosis not present

## 2015-03-07 DIAGNOSIS — M8588 Other specified disorders of bone density and structure, other site: Secondary | ICD-10-CM | POA: Diagnosis not present

## 2015-03-07 DIAGNOSIS — E2839 Other primary ovarian failure: Secondary | ICD-10-CM | POA: Diagnosis not present

## 2015-03-14 ENCOUNTER — Telehealth: Payer: Self-pay | Admitting: Family Medicine

## 2015-03-14 NOTE — Telephone Encounter (Signed)
Pt calling to find out strength/dose of vitamin D and calcium to take. Please call back. If no answer please leave detailed msg on her machine.

## 2015-03-14 NOTE — Telephone Encounter (Signed)
VM left advising calcium 1200-1500 mg daily and Vitamin D 1,000 units daily. I advised to call if any further questions.    KP

## 2015-05-10 DIAGNOSIS — J019 Acute sinusitis, unspecified: Secondary | ICD-10-CM | POA: Diagnosis not present

## 2015-08-04 ENCOUNTER — Ambulatory Visit: Payer: Medicare Other | Admitting: Family Medicine

## 2015-08-12 ENCOUNTER — Ambulatory Visit (INDEPENDENT_AMBULATORY_CARE_PROVIDER_SITE_OTHER): Payer: Medicare Other | Admitting: Family Medicine

## 2015-08-12 ENCOUNTER — Encounter: Payer: Self-pay | Admitting: Family Medicine

## 2015-08-12 VITALS — BP 138/86 | HR 62 | Temp 97.5°F | Ht 63.0 in | Wt 146.8 lb

## 2015-08-12 DIAGNOSIS — I10 Essential (primary) hypertension: Secondary | ICD-10-CM | POA: Diagnosis not present

## 2015-08-12 DIAGNOSIS — E785 Hyperlipidemia, unspecified: Secondary | ICD-10-CM | POA: Diagnosis not present

## 2015-08-12 DIAGNOSIS — E1165 Type 2 diabetes mellitus with hyperglycemia: Secondary | ICD-10-CM | POA: Diagnosis not present

## 2015-08-12 DIAGNOSIS — E1139 Type 2 diabetes mellitus with other diabetic ophthalmic complication: Secondary | ICD-10-CM

## 2015-08-12 DIAGNOSIS — F411 Generalized anxiety disorder: Secondary | ICD-10-CM

## 2015-08-12 DIAGNOSIS — IMO0002 Reserved for concepts with insufficient information to code with codable children: Secondary | ICD-10-CM

## 2015-08-12 LAB — COMPREHENSIVE METABOLIC PANEL
ALK PHOS: 67 U/L (ref 39–117)
ALT: 19 U/L (ref 0–35)
AST: 23 U/L (ref 0–37)
Albumin: 4.4 g/dL (ref 3.5–5.2)
BUN: 23 mg/dL (ref 6–23)
CALCIUM: 10.1 mg/dL (ref 8.4–10.5)
CO2: 27 mEq/L (ref 19–32)
Chloride: 103 mEq/L (ref 96–112)
Creatinine, Ser: 1.29 mg/dL — ABNORMAL HIGH (ref 0.40–1.20)
GFR: 42.39 mL/min — AB (ref >60.00)
Glucose, Bld: 116 mg/dL — ABNORMAL HIGH (ref 70–99)
POTASSIUM: 3.8 meq/L (ref 3.5–5.1)
Sodium: 140 mEq/L (ref 135–145)
TOTAL PROTEIN: 7.4 g/dL (ref 6.0–8.3)
Total Bilirubin: 1.2 mg/dL (ref 0.2–1.2)

## 2015-08-12 LAB — CBC WITH DIFFERENTIAL/PLATELET
Basophils Absolute: 0 10*3/uL (ref 0.0–0.1)
Basophils Relative: 0.7 % (ref 0.0–3.0)
EOS PCT: 1.7 % (ref 0.0–5.0)
Eosinophils Absolute: 0.1 10*3/uL (ref 0.0–0.7)
HEMATOCRIT: 44.3 % (ref 36.0–46.0)
Hemoglobin: 14.9 g/dL (ref 12.0–15.0)
LYMPHS ABS: 1.9 10*3/uL (ref 0.7–4.0)
Lymphocytes Relative: 31.2 % (ref 12.0–46.0)
MCHC: 33.6 g/dL (ref 30.0–36.0)
MCV: 83.4 fl (ref 78.0–100.0)
MONOS PCT: 7.5 % (ref 3.0–12.0)
Monocytes Absolute: 0.5 10*3/uL (ref 0.1–1.0)
NEUTROS PCT: 58.9 % (ref 43.0–77.0)
Neutro Abs: 3.6 10*3/uL (ref 1.4–7.7)
Platelets: 205 10*3/uL (ref 150.0–400.0)
RBC: 5.31 Mil/uL — AB (ref 3.87–5.11)
RDW: 14 % (ref 11.5–15.5)
WBC: 6.1 10*3/uL (ref 4.0–10.5)

## 2015-08-12 LAB — HEMOGLOBIN A1C: HEMOGLOBIN A1C: 6.2 % (ref 4.6–6.5)

## 2015-08-12 LAB — LIPID PANEL
Cholesterol: 152 mg/dL (ref 0–200)
HDL: 66.4 mg/dL (ref >39.00)
LDL Cholesterol: 68 mg/dL (ref 0–99)
NONHDL: 86.06
TRIGLYCERIDES: 88 mg/dL (ref 0.0–149.0)
Total CHOL/HDL Ratio: 2
VLDL: 17.6 mg/dL (ref 0.0–40.0)

## 2015-08-12 MED ORDER — ATENOLOL 50 MG PO TABS: ORAL_TABLET | ORAL | Status: DC

## 2015-08-12 MED ORDER — SIMVASTATIN 20 MG PO TABS: 20.0000 mg | ORAL_TABLET | Freq: Every day | ORAL | Status: DC

## 2015-08-12 MED ORDER — POTASSIUM CHLORIDE CRYS ER 20 MEQ PO TBCR: EXTENDED_RELEASE_TABLET | ORAL | Status: DC

## 2015-08-12 MED ORDER — LOSARTAN POTASSIUM 100 MG PO TABS: ORAL_TABLET | ORAL | Status: DC

## 2015-08-12 MED ORDER — TRIAMTERENE-HCTZ 75-50 MG PO TABS: 0.5000 | ORAL_TABLET | Freq: Every day | ORAL | Status: DC

## 2015-08-12 MED ORDER — DIAZEPAM 5 MG PO TABS: 5.0000 mg | ORAL_TABLET | Freq: Every day | ORAL | Status: DC

## 2015-08-12 NOTE — Progress Notes (Signed)
Pre visit review using our clinic review tool, if applicable. No additional management support is needed unless otherwise documented below in the visit note. 

## 2015-08-12 NOTE — Progress Notes (Signed)
Patient ID: [name], female    DOB: [birth year]  Age: 81 y.o. MRN: [id number]    Subjective:  Subjective HPI [name] presents for f/u bp, cholesterol, and dm.    HPI HYPERTENSION  Blood pressure range-not checking   Chest pain- no      Dyspnea- no Lightheadedness- no   Edema- no Other side effects - no   Medication compliance: good Low salt diet- yes  DIABETES  Blood Sugar ranges-not checking   Polyuria- no New Visual problems- no Hypoglycemic symptoms- no Other side effects-no Medication compliance - good  Last eye exam- annually Foot exam- today  HYPERLIPIDEMIA  Medication compliance- good RUQ pain- no  Muscle aches- no Other side effects-no   Review of Systems  Constitutional: Negative for diaphoresis, appetite change, fatigue and unexpected weight change.  Eyes: Negative for pain, redness and visual disturbance.  Respiratory: Negative for cough, chest tightness, shortness of breath and wheezing.   Cardiovascular: Negative for chest pain, palpitations and leg swelling.  Endocrine: Negative for cold intolerance, heat intolerance, polydipsia, polyphagia and polyuria.  Genitourinary: Negative for dysuria, frequency and difficulty urinating.  Neurological: Negative for dizziness, light-headedness, numbness and headaches.    History Past Medical History  Diagnosis Date  . Diabetes mellitus     type 2  . Hypertension   . Hyperlipidemia   . Thyroid nodule   . Abscess, rectum   . Fatty liver disease, nonalcoholic     She has past surgical history that includes Abdominal hysterectomy; Anal fissure repair; Tonsillectomy and adenoidectomy; Hemorrhoid surgery; Cataract extraction, bilateral (Feb); and Eye surgery (06/2013).   Her family history includes Diabetes in her brother and mother; Hypertension in her brother, mother, and sister; Mental retardation in her sister; Other in her brother.She reports that she has never smoked. She has never used  smokeless tobacco. She reports that she does not drink alcohol or use illicit drugs.  Current Outpatient Prescriptions on File Prior to Visit  Medication Sig Dispense Refill  . aspirin 81 MG tablet Take 81 mg by mouth daily.      Marland Kitchen. nystatin ointment (MYCOSTATIN) Apply 1 application topically 2 (two) times daily. 30 g 0   No current facility-administered medications on file prior to visit.     Objective:  Objective Physical Exam  Constitutional: She is oriented to person, place, and time. She appears well-developed and well-nourished.  HENT:  Head: Normocephalic and atraumatic.  Eyes: Conjunctivae and EOM are normal.  Neck: Normal range of motion. Neck supple. No JVD present. Carotid bruit is not present. No thyromegaly present.  Cardiovascular: Normal rate, regular rhythm and normal heart sounds.   No murmur heard. Pulmonary/Chest: Effort normal and breath sounds normal. No respiratory distress. She has no wheezes. She has no rales. She exhibits no tenderness.  Musculoskeletal: She exhibits no edema.  Neurological: She is alert and oriented to person, place, and time.  Psychiatric: She has a normal mood and affect. Her behavior is normal. Judgment and thought content normal.  Nursing note and vitals reviewed. Sensory exam of the foot is normal, tested with the monofilament. Good pulses, no lesions or ulcers, good peripheral pulses. BP 138/86 mmHg  Pulse 62  Temp(Src) 97.5 F (36.4 C) (Oral)  Ht 5\' 3"  (1.6 m)  Wt 146 lb 12.8 oz (66.588 kg)  BMI 26.01 kg/m2  SpO2 98% Wt Readings from Last 3 Encounters:  08/12/15 146 lb 12.8 oz (66.588 kg)  02/03/15 145 lb (65.772 kg)  07/27/14 143  lb 12.8 oz (65.227 kg)     Lab Results  Component Value Date   WBC 6.1 08/12/2015   HGB 14.9 08/12/2015   HCT 44.3 08/12/2015   PLT 205.0 08/12/2015   GLUCOSE 116* 08/12/2015   CHOL 152 08/12/2015   TRIG 88.0 08/12/2015   HDL 66.40 08/12/2015   LDLCALC 68 08/12/2015   ALT 19 08/12/2015    AST 23 08/12/2015   NA 140 08/12/2015   K 3.8 08/12/2015   CL 103 08/12/2015   CREATININE 1.29* 08/12/2015   BUN 23 08/12/2015   CO2 27 08/12/2015   TSH 3.46 02/03/2008   INR 1.04 06/02/2010   HGBA1C 6.2 08/12/2015   MICROALBUR <0.7 01/27/2015    Dg Bone Density  03/07/2015  EXAM: DUAL X-RAY ABSORPTIOMETRY (DXA) FOR BONE MINERAL DENSITY IMPRESSION: Referring Physician:  Lelon Perla PATIENT: Name: [name] Patient ID: [id number] Birth Date: [name] Height: 62.0 in. Sex: Female Measured: 03/07/2015 Weight: 145.0 lbs. Indications: Advanced Age, Caucasian, Post Menopausal, Secondary Osteoporosis Fractures: Wrist Treatments: ASSESSMENT: The BMD measured at Femur Neck Right is 0.866 g/cm2 with a T-score of -1.2. This [name] according to World Health Organization [facility]) criteria. Site Region Measured Date Measured Age WHO YA BMD Classification T-score AP Spine L1-L4 5/07/2015 [age] Normal 0.0 1.181 g/cm2 DualFemur Neck Right 5/07/2015 [age] Osteopenia -1.2 0.866 g/cm2 World Health Organization [facility]) criteria for post-menopausal, Caucasian Women: Normal       T-score at or above -1 SD Osteopenia   T-score between -1 and -2.5 SD Osteoporosis T-score at or below -2.5 SD RECOMMENDATION: National Osteoporosis Foundation recommends that FDA-approved medical therapies be considered in postmenopausal women and men age 41 or older with a: 1. Hip or vertebral (clinical or morphometric) fracture. 2. T-score of < -2.5 at the spine or hip. 3. Ten-year fracture probability by FRAX of 3% or greater for hip fracture or 20% or greater for major osteoporotic fracture. All treatment decisions require clinical judgment and consideration of individual patient factors, including patient preferences, co-morbidities, previous drug use, risk factors not captured in the FRAX model (e.g. falls, vitamin D deficiency, increased bone turnover, interval significant decline in bone density) and  possible under - or over-estimation of fracture risk by FRAX. All patients should ensure an adequate intake of dietary calcium (1200 mg/d) and vitamin D (800 IU daily) unless contraindicated. FOLLOW-UP: People with diagnosed cases of osteoporosis or at high risk for fracture should have regular bone mineral density tests. For patients eligible for Medicare, routine testing is allowed once every 2 years. The testing frequency can be increased to one year for patients who have rapidly progressing disease, those who are receiving or discontinuing medical therapy to restore bone mass, or have additional risk factors. I have reviewed this report and agree with the above findings. Mark A. Tyron Russell, M.D. [facility] Radiology Patient: [name] Referring Physician: Lelon Perla Birth Date: [name] [age] Patient ID: [id number] Height: 62.0 in. Weight: 145.0 lbs. Measured: 03/07/2015 10:05:47 AM (16 SP 2) Sex: Female Ethnicity: White Analyzed: 03/07/2015 10:10:38 AM (16 SP 2) FRAX* 10-year Probability of Fracture Based on femoral neck BMD: DualFemur (Right) Major Osteoporotic Fracture: 12.0% Hip Fracture:                2.4% Population:                  [place] (Caucasian) Risk Factors:  Secondary Osteoporosis ASSESSMENT: The probability of a major osteoporotic fracture is 12.0% within the next ten years. The probability of a hip fracture is 2.4% within the next ten years. *FRAX is a Armed forces logistics/support/administrative officer of the [employer] of [employer] for Metabolic Bone Disease, a World Science writer (WHO) [employer]. I have reviewed this report and agree with the above findings. Mark A. Tyron Russell, M.D. [facility] Radiology Electronically Signed   By: Ulyses Southward M.D.   On: 03/07/2015 10:30     Assessment & Plan:  Plan I have discontinued [name]'s pneumococcal 13-valent conjugate vaccine. I have also changed her diazepam. Additionally, I am having her maintain her aspirin,  nystatin ointment, triamterene-hydrochlorothiazide, simvastatin, potassium chloride SA, losartan, and atenolol.  Meds ordered this encounter  Medications  . diazepam (VALIUM) 5 MG tablet    Sig: Take 1 tablet (5 mg total) by mouth daily.    Dispense:  45 tablet    Refill:  1  . triamterene-hydrochlorothiazide (MAXZIDE) 75-50 MG tablet    Sig: Take 0.5 tablets by mouth daily.    Dispense:  45 tablet    Refill:  3  . simvastatin (ZOCOR) 20 MG tablet    Sig: Take 1 tablet (20 mg total) by mouth at bedtime.    Dispense:  90 tablet    Refill:  3  . potassium chloride SA (K-DUR,KLOR-CON) 20 MEQ tablet    Sig: TAKE 1 TABLET BY MOUTH ONCE DAILY    Dispense:  90 tablet    Refill:  3  . losartan (COZAAR) 100 MG tablet    Sig: TAKE 1 TABLET BY MOUTH DAILY    Dispense:  90 tablet    Refill:  3  . atenolol (TENORMIN) 50 MG tablet    Sig: 1 tab by mouth daily--    Dispense:  90 tablet    Refill:  3    Problem List Items Addressed This Visit      Unprioritized   Hyperlipidemia   Relevant Medications   triamterene-hydrochlorothiazide (MAXZIDE) 75-50 MG tablet   simvastatin (ZOCOR) 20 MG tablet   losartan (COZAAR) 100 MG tablet   atenolol (TENORMIN) 50 MG tablet   Other Relevant Orders   POCT urinalysis dipstick   Lipid panel (Completed)   CBC with Differential/Platelet (Completed)   Comprehensive metabolic panel (Completed)   Essential hypertension - Primary   Relevant Medications   triamterene-hydrochlorothiazide (MAXZIDE) 75-50 MG tablet   simvastatin (ZOCOR) 20 MG tablet   potassium chloride SA (K-DUR,KLOR-CON) 20 MEQ tablet   losartan (COZAAR) 100 MG tablet   atenolol (TENORMIN) 50 MG tablet   Other Relevant Orders   POCT urinalysis dipstick   Lipid panel (Completed)   CBC with Differential/Platelet (Completed)   Comprehensive metabolic panel (Completed)    Other Visit Diagnoses    Generalized anxiety disorder        Relevant Medications    diazepam (VALIUM) 5 MG  tablet    Other Relevant Orders    CBC with Differential/Platelet (Completed)    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  Donato Schultz, DO

## 2015-08-12 NOTE — Patient Instructions (Signed)
Blood Glucose Monitoring, Adult ° °Monitoring your blood glucose (also know as blood sugar) helps you to manage your diabetes. It also helps you and your health care provider monitor your diabetes and determine how well your treatment plan is working. °WHY SHOULD YOU MONITOR YOUR BLOOD GLUCOSE? °· It can help you understand how food, exercise, and medicine affect your blood glucose. °· It allows you to know what your blood glucose is at any given moment. You can quickly tell if you are having low blood glucose (hypoglycemia) or high blood glucose (hyperglycemia). °· It can help you and your health care provider know how to adjust your medicines. °· It can help you understand how to manage an illness or adjust medicine for exercise. °WHEN SHOULD YOU TEST? °Your health care provider will help you decide how often you should check your blood glucose. This may depend on the type of diabetes you have, your diabetes control, or the types of medicines you are taking. Be sure to write down all of your blood glucose readings so that this information can be reviewed with your health care provider. See below for examples of testing times that your health care provider may suggest. °Type 1 Diabetes °· Test at least 2 times per day if your diabetes is well controlled, if you are using an insulin pump, or if you perform multiple daily injections. °· If your diabetes is not well controlled or if you are sick, you may need to test more often. °· It is a good idea to also test: °¨ Before every insulin injection. °¨ Before and after exercise. °¨ Between meals and 2 hours after a meal. °¨ Occasionally between 2:00 a.m. and 3:00 a.m. °Type 2 Diabetes °· If you are taking insulin, test at least 2 times per day. However, it is best to test before every insulin injection. °· If you take medicines by mouth (orally), test 2 times a day. °· If you are on a controlled diet, test once a day. °· If your diabetes is not well controlled or if you  are sick, you may need to monitor more often. °HOW TO MONITOR YOUR BLOOD GLUCOSE °Supplies Needed °· Blood glucose meter. °· Test strips for your meter. Each meter has its own strips. You must use the strips that go with your own meter. °· A pricking needle (lancet). °· A device that holds the lancet (lancing device). °· A journal or log book to write down your results. °Procedure °· Wash your hands with soap and water. Alcohol is not preferred. °· Prick the side of your finger (not the tip) with the lancet. °· Gently milk the finger until a small drop of blood appears. °· Follow the instructions that come with your meter for inserting the test strip, applying blood to the strip, and using your blood glucose meter. °Other Areas to Get Blood for Testing °Some meters allow you to use other areas of your body (other than your finger) to test your blood. These areas are called alternative sites. The most common alternative sites are: °· The forearm. °· The thigh. °· The back area of the lower leg. °· The palm of the hand. °The blood flow in these areas is slower. Therefore, the blood glucose values you get may be delayed, and the numbers are different from what you would get from your fingers. Do not use alternative sites if you think you are having hypoglycemia. Your reading will not be accurate. Always use a finger if you   are having hypoglycemia. Also, if you cannot feel your lows (hypoglycemia unawareness), always use your fingers for your blood glucose checks. °ADDITIONAL TIPS FOR GLUCOSE MONITORING °· Do not reuse lancets. °· Always carry your supplies with you. °· All blood glucose meters have a 24-hour "hotline" number to call if you have questions or need help. °· Adjust (calibrate) your blood glucose meter with a control solution after finishing a few boxes of strips. °BLOOD GLUCOSE RECORD KEEPING °It is a good idea to keep a daily record or log of your blood glucose readings. Most glucose meters, if not all,  keep your glucose records stored in the meter. Some meters come with the ability to download your records to your home computer. Keeping a record of your blood glucose readings is especially helpful if you are wanting to look for patterns. Make notes to go along with the blood glucose readings because you might forget what happened at that exact time. Keeping good records helps you and your health care provider to work together to achieve good diabetes management.  °  °This information is not intended to replace advice given to you by your health care provider. Make sure you discuss any questions you have with your health care provider. °  °Document Released: 04/26/2003 Document Revised: 05/14/2014 Document Reviewed: 09/15/2012 °Elsevier Interactive Patient Education ©2016 Elsevier Inc. ° °

## 2015-08-14 NOTE — Assessment & Plan Note (Signed)
con't meds  Check labs 

## 2015-08-14 NOTE — Assessment & Plan Note (Signed)
Stable con't meds 

## 2015-08-14 NOTE — Assessment & Plan Note (Signed)
Check labs stable 

## 2015-08-15 ENCOUNTER — Other Ambulatory Visit: Payer: Self-pay

## 2015-08-15 DIAGNOSIS — R7989 Other specified abnormal findings of blood chemistry: Secondary | ICD-10-CM

## 2015-08-16 ENCOUNTER — Telehealth: Payer: Self-pay | Admitting: Family Medicine

## 2015-08-16 NOTE — Telephone Encounter (Signed)
Can be reached: [phone] please call early (before 12pm)  Reason for call: pt requesting call from Kim 08/17/15 to discuss info from lab results she has questions on.

## 2015-08-17 NOTE — Telephone Encounter (Signed)
Called patient back reviewed lab results with her again.  She says she had company over when [name] called her before and she was not sure she understood everything.  She stated understanding this time.  No further questions or concerns voiced.

## 2015-08-29 ENCOUNTER — Other Ambulatory Visit (INDEPENDENT_AMBULATORY_CARE_PROVIDER_SITE_OTHER): Payer: Medicare Other

## 2015-08-29 DIAGNOSIS — R748 Abnormal levels of other serum enzymes: Secondary | ICD-10-CM | POA: Diagnosis not present

## 2015-08-29 DIAGNOSIS — R7989 Other specified abnormal findings of blood chemistry: Secondary | ICD-10-CM

## 2015-08-29 LAB — BASIC METABOLIC PANEL
BUN: 22 mg/dL (ref 6–23)
CO2: 27 mEq/L (ref 19–32)
Calcium: 9.8 mg/dL (ref 8.4–10.5)
Chloride: 100 mEq/L (ref 96–112)
Creatinine, Ser: 0.97 mg/dL (ref 0.40–1.20)
GFR: 58.89 mL/min — AB (ref >60.00)
Glucose, Bld: 115 mg/dL — ABNORMAL HIGH (ref 70–99)
POTASSIUM: 3.5 meq/L (ref 3.5–5.1)
SODIUM: 138 meq/L (ref 135–145)

## 2015-09-06 ENCOUNTER — Other Ambulatory Visit: Payer: Self-pay | Admitting: Family Medicine

## 2016-01-06 ENCOUNTER — Telehealth: Payer: Self-pay | Admitting: Family Medicine

## 2016-01-06 ENCOUNTER — Encounter: Payer: Self-pay | Admitting: Family Medicine

## 2016-01-06 NOTE — Telephone Encounter (Signed)
Attempted to contact patient. Mailed letter informing patient that appointment scheduled for 02/16/2016 has been cancelled. Asked patient to call the office to reschedule.

## 2016-01-10 ENCOUNTER — Telehealth: Payer: Self-pay

## 2016-01-10 NOTE — Telephone Encounter (Signed)
Atenolol is on back order. Please advise on an alternative.    KP

## 2016-01-11 MED ORDER — METOPROLOL SUCCINATE ER 50 MG PO TB24: 50.0000 mg | ORAL_TABLET | Freq: Every day | ORAL | 2 refills | Status: DC

## 2016-01-11 NOTE — Telephone Encounter (Signed)
toprol xl 50 mg 1 po qd  #30  2 refills Ov 2-3 weeks to check bp

## 2016-01-11 NOTE — Telephone Encounter (Signed)
[facility] per below instruction. Notified pt's son. He states pt will have to call us back this evening to schedule follow up in 3 weeks.  Can you please follow up with pt tomorrow if she does not call back today to schedule appt? Thanks!

## 2016-01-11 NOTE — Telephone Encounter (Signed)
Pharmacy is calling regarding approval to switch, please call [phone]

## 2016-02-03 ENCOUNTER — Encounter: Payer: Self-pay | Admitting: Family Medicine

## 2016-02-03 ENCOUNTER — Ambulatory Visit: Payer: Medicare Other | Admitting: Family Medicine

## 2016-02-03 ENCOUNTER — Ambulatory Visit (INDEPENDENT_AMBULATORY_CARE_PROVIDER_SITE_OTHER): Payer: Medicare Other | Admitting: Family Medicine

## 2016-02-03 VITALS — BP 137/86 | HR 71 | Temp 97.8°F | Resp 16 | Ht 63.0 in | Wt 148.6 lb

## 2016-02-03 DIAGNOSIS — I10 Essential (primary) hypertension: Secondary | ICD-10-CM

## 2016-02-03 MED ORDER — METOPROLOL SUCCINATE ER 50 MG PO TB24: 50.0000 mg | ORAL_TABLET | Freq: Every day | ORAL | 3 refills | Status: DC

## 2016-02-03 NOTE — Progress Notes (Signed)
Patient ID: Shawnie PonsCaroline M Westerfeld, female    DOB: 1934-11-11  Age: 81 y.o. MRN: [id number]    Subjective:  Subjective  HPI Shawnie PonsCaroline M Scarlett presents for f/u bp since changing meds. No complaints.   Review of Systems  Constitutional: Negative for appetite change, diaphoresis, fatigue and unexpected weight change.  Eyes: Negative for pain, redness and visual disturbance.  Respiratory: Negative for cough, chest tightness, shortness of breath and wheezing.   Cardiovascular: Negative for chest pain, palpitations and leg swelling.  Endocrine: Negative for cold intolerance, heat intolerance, polydipsia, polyphagia and polyuria.  Genitourinary: Negative for difficulty urinating, dysuria and frequency.  Neurological: Negative for dizziness, light-headedness, numbness and headaches.    History Past Medical History:  Diagnosis Date  . Abscess, rectum   . Diabetes mellitus    type 2  . Fatty liver disease, nonalcoholic   . Hyperlipidemia   . Hypertension   . Thyroid nodule     She has a past surgical history that includes Abdominal hysterectomy; Anal fissure repair; Tonsillectomy and adenoidectomy; Hemorrhoid surgery; Cataract extraction, bilateral (Feb); and Eye surgery (06/2013).   Her family history includes Diabetes in her brother and mother; Hypertension in her brother, mother, and sister; Mental retardation in her sister; Other in her brother.She reports that she has never smoked. She has never used smokeless tobacco. She reports that she does not drink alcohol or use drugs.  Current Outpatient Prescriptions on File Prior to Visit  Medication Sig Dispense Refill  . aspirin 81 MG tablet Take 81 mg by mouth daily.      . diazepam (VALIUM) 5 MG tablet Take 1 tablet (5 mg total) by mouth daily. 45 tablet 1  . losartan (COZAAR) 100 MG tablet TAKE 1 TABLET BY MOUTH DAILY 90 tablet 3  . nystatin ointment (MYCOSTATIN) Apply 1 application topically 2 (two) times daily. 30 g 0  . potassium  chloride SA (K-DUR,KLOR-CON) 20 MEQ tablet TAKE 1 TABLET BY MOUTH ONCE DAILY 90 tablet 3  . simvastatin (ZOCOR) 20 MG tablet Take 1 tablet (20 mg total) by mouth at bedtime. 90 tablet 3  . triamterene-hydrochlorothiazide (MAXZIDE) 75-50 MG tablet Take 0.5 tablets by mouth daily. 45 tablet 3   No current facility-administered medications on file prior to visit.      Objective:  Objective  Physical Exam  Constitutional: She is oriented to person, place, and time. She appears well-developed and well-nourished.  HENT:  Head: Normocephalic and atraumatic.  Eyes: Conjunctivae and EOM are normal.  Neck: Normal range of motion. Neck supple. No JVD present. Carotid bruit is not present. No thyromegaly present.  Cardiovascular: Normal rate, regular rhythm and normal heart sounds.   No murmur heard. Pulmonary/Chest: Effort normal and breath sounds normal. No respiratory distress. She has no wheezes. She has no rales. She exhibits no tenderness.  Musculoskeletal: She exhibits no edema.  Neurological: She is alert and oriented to person, place, and time.  Psychiatric: She has a normal mood and affect.  Nursing note and vitals reviewed.  BP 137/86 (BP Location: Right Arm, Patient Position: Sitting, Cuff Size: Normal)   Pulse 71   Temp 97.8 F (36.6 C) (Oral)   Resp 16   Ht 5\' 3"  (1.6 m)   Wt 148 lb 9.6 oz (67.4 kg)   SpO2 97%   BMI 26.32 kg/m  Wt Readings from Last 3 Encounters:  02/03/16 148 lb 9.6 oz (67.4 kg)  08/12/15 146 lb 12.8 oz (66.6 kg)  02/03/15 145 lb (65.8 kg)  Lab Results  Component Value Date   WBC 6.1 08/12/2015   HGB 14.9 08/12/2015   HCT 44.3 08/12/2015   PLT 205.0 08/12/2015   GLUCOSE 115 (H) 08/29/2015   CHOL 152 08/12/2015   TRIG 88.0 08/12/2015   HDL 66.40 08/12/2015   LDLCALC 68 08/12/2015   ALT 19 08/12/2015   AST 23 08/12/2015   NA 138 08/29/2015   K 3.5 08/29/2015   CL 100 08/29/2015   CREATININE 0.97 08/29/2015   BUN 22 08/29/2015   CO2 27  08/29/2015   TSH 3.46 02/03/2008   INR 1.04 06/02/2010   HGBA1C 6.2 08/12/2015   MICROALBUR <0.7 01/27/2015    Dg Bone Density  Result Date: 03/07/2015 EXAM: DUAL X-RAY ABSORPTIOMETRY (DXA) FOR BONE MINERAL DENSITY IMPRESSION: Referring Physician:  Lelon Perla PATIENT: Name: Hamna, Asa Patient ID: [id number] Birth Date: 06/01/1934 Height: 62.0 in. Sex: Female Measured: 03/07/2015 Weight: 145.0 lbs. Indications: Advanced Age, Caucasian, Post Menopausal, Secondary Osteoporosis Fractures: Wrist Treatments: ASSESSMENT: The BMD measured at Femur Neck Right is 0.866 g/cm2 with a T-score of -1.2. This [name] according to World Health Organization [facility]) criteria. Site Region Measured Date Measured Age WHO YA BMD Classification T-score AP Spine L1-L4 5/07/2015 [age] Normal 0.0 1.181 g/cm2 DualFemur Neck Right 5/07/2015 [age] Osteopenia -1.2 0.866 g/cm2 World Health Organization [facility]) criteria for post-menopausal, Caucasian Women: Normal       T-score at or above -1 SD Osteopenia   T-score between -1 and -2.5 SD Osteoporosis T-score at or below -2.5 SD RECOMMENDATION: National Osteoporosis Foundation recommends that FDA-approved medical therapies be considered in postmenopausal women and men age 41 or older with a: 1. Hip or vertebral (clinical or morphometric) fracture. 2. T-score of < -2.5 at the spine or hip. 3. Ten-year fracture probability by FRAX of 3% or greater for hip fracture or 20% or greater for major osteoporotic fracture. All treatment decisions require clinical judgment and consideration of individual patient factors, including patient preferences, co-morbidities, previous drug use, risk factors not captured in the FRAX model (e.g. falls, vitamin D deficiency, increased bone turnover, interval significant decline in bone density) and possible under - or over-estimation of fracture risk by FRAX. All patients should ensure an adequate intake of dietary calcium  (1200 mg/d) and vitamin D (800 IU daily) unless contraindicated. FOLLOW-UP: People with diagnosed cases of osteoporosis or at high risk for fracture should have regular bone mineral density tests. For patients eligible for Medicare, routine testing is allowed once every 2 years. The testing frequency can be increased to one year for patients who have rapidly progressing disease, those who are receiving or discontinuing medical therapy to restore bone mass, or have additional risk factors. I have reviewed this report and agree with the above findings. Mark A. Tyron Russell, M.D. [facility] Radiology Patient: Nayelli, Inglis Referring Physician: Lelon Perla Birth Date: [birth year] [age] Patient ID: [id number] Height: 62.0 in. Weight: 145.0 lbs. Measured: 03/07/2015 10:05:47 AM (16 SP 2) Sex: Female Ethnicity: White Analyzed: 03/07/2015 10:10:38 AM (16 SP 2) FRAX* 10-year Probability of Fracture Based on femoral neck BMD: DualFemur (Right) Major Osteoporotic Fracture: 12.0% Hip Fracture:                2.4% Population:                  [place] (Caucasian) Risk Factors:  Secondary Osteoporosis ASSESSMENT: The probability of a major osteoporotic fracture is 12.0% within the next ten years. The probability of a hip fracture is 2.4% within the next ten years. *FRAX is a Armed forces logistics/support/administrative officer of the [employer] of [employer] for Metabolic Bone Disease, a World Science writer (WHO) [employer]. I have reviewed this report and agree with the above findings. Mark A. Tyron Russell, M.D. [facility] Radiology Electronically Signed   By: Ulyses Southward M.D.   On: 03/07/2015 10:30     Assessment & Plan:  Plan  I am having Ms. Arcilla maintain her aspirin, nystatin ointment, diazepam, triamterene-hydrochlorothiazide, simvastatin, potassium chloride SA, losartan, and metoprolol succinate.  Meds ordered this encounter  Medications  . metoprolol succinate (TOPROL XL) 50 MG 24 hr tablet     Sig: Take 1 tablet (50 mg total) by mouth daily. Take with or immediately following a meal.    Dispense:  90 tablet    Refill:  3    Problem List Items Addressed This Visit      Unprioritized   Essential hypertension - Primary   Relevant Medications   metoprolol succinate (TOPROL XL) 50 MG 24 hr tablet    Other Visit Diagnoses   None.   bp good--  Keep cpe appointment  Follow-up: Return for as scheduled.  Donato Schultz, DO

## 2016-02-03 NOTE — Patient Instructions (Signed)
Hypertension Hypertension, commonly called high blood pressure, is when the force of blood pumping through your arteries is too strong. Your arteries are the blood vessels that carry blood from your heart throughout your body. A blood pressure reading consists of a higher number over a lower number, such as 110/72. The higher number (systolic) is the pressure inside your arteries when your heart pumps. The lower number (diastolic) is the pressure inside your arteries when your heart relaxes. Ideally you want your blood pressure below 120/80. Hypertension forces your heart to work harder to pump blood. Your arteries may become narrow or stiff. Having untreated or uncontrolled hypertension can cause heart attack, stroke, kidney disease, and other problems. RISK FACTORS Some risk factors for high blood pressure are controllable. Others are not.  Risk factors you cannot control include:   Race. You may be at higher risk if you are African American.  Age. Risk increases with age.  Gender. Men are at higher risk than women before age 45 years. After age 65, women are at higher risk than men. Risk factors you can control include:  Not getting enough exercise or physical activity.  Being overweight.  Getting too much fat, sugar, calories, or salt in your diet.  Drinking too much alcohol. SIGNS AND SYMPTOMS Hypertension does not usually cause signs or symptoms. Extremely high blood pressure (hypertensive crisis) may cause headache, anxiety, shortness of breath, and nosebleed. DIAGNOSIS To check if you have hypertension, your health care provider will measure your blood pressure while you are seated, with your arm held at the level of your heart. It should be measured at least twice using the same arm. Certain conditions can cause a difference in blood pressure between your right and left arms. A blood pressure reading that is higher than normal on one occasion does not mean that you need treatment. If  it is not clear whether you have high blood pressure, you may be asked to return on a different day to have your blood pressure checked again. Or, you may be asked to monitor your blood pressure at home for 1 or more weeks. TREATMENT Treating high blood pressure includes making lifestyle changes and possibly taking medicine. Living a healthy lifestyle can help lower high blood pressure. You may need to change some of your habits. Lifestyle changes may include:  Following the DASH diet. This diet is high in fruits, vegetables, and whole grains. It is low in salt, red meat, and added sugars.  Keep your sodium intake below 2,300 mg per day.  Getting at least 30-45 minutes of aerobic exercise at least 4 times per week.  Losing weight if necessary.  Not smoking.  Limiting alcoholic beverages.  Learning ways to reduce stress. Your health care provider may prescribe medicine if lifestyle changes are not enough to get your blood pressure under control, and if one of the following is true:  You are 18-59 years of age and your systolic blood pressure is above 140.  You are 60 years of age or older, and your systolic blood pressure is above 150.  Your diastolic blood pressure is above 90.  You have diabetes, and your systolic blood pressure is over 140 or your diastolic blood pressure is over 90.  You have kidney disease and your blood pressure is above 140/90.  You have heart disease and your blood pressure is above 140/90. Your personal target blood pressure may vary depending on your medical conditions, your age, and other factors. HOME CARE INSTRUCTIONS    Have your blood pressure rechecked as directed by your health care provider.   Take medicines only as directed by your health care provider. Follow the directions carefully. Blood pressure medicines must be taken as prescribed. The medicine does not work as well when you skip doses. Skipping doses also puts you at risk for  problems.  Do not smoke.   Monitor your blood pressure at home as directed by your health care provider. SEEK MEDICAL CARE IF:   You think you are having a reaction to medicines taken.  You have recurrent headaches or feel dizzy.  You have swelling in your ankles.  You have trouble with your vision. SEEK IMMEDIATE MEDICAL CARE IF:  You develop a severe headache or confusion.  You have unusual weakness, numbness, or feel faint.  You have severe chest or abdominal pain.  You vomit repeatedly.  You have trouble breathing. MAKE SURE YOU:   Understand these instructions.  Will watch your condition.  Will get help right away if you are not doing well or get worse.   This information is not intended to replace advice given to you by your health care provider. Make sure you discuss any questions you have with your health care provider.   Document Released: 04/23/2005 Document Revised: 09/07/2014 Document Reviewed: 02/13/2013 Elsevier Interactive Patient Education 2016 Elsevier Inc.  

## 2016-02-03 NOTE — Progress Notes (Signed)
Pre visit review using our clinic review tool, if applicable. No additional management support is needed unless otherwise documented below in the visit note. 

## 2016-02-14 DIAGNOSIS — H04123 Dry eye syndrome of bilateral lacrimal glands: Secondary | ICD-10-CM | POA: Diagnosis not present

## 2016-02-16 ENCOUNTER — Ambulatory Visit: Payer: Medicare Other | Admitting: Family Medicine

## 2016-02-27 ENCOUNTER — Ambulatory Visit (INDEPENDENT_AMBULATORY_CARE_PROVIDER_SITE_OTHER): Payer: Medicare Other | Admitting: Medical

## 2016-02-27 VITALS — BP 128/84 | HR 75 | Temp 98.0°F | Ht 63.0 in | Wt 149.0 lb

## 2016-02-27 DIAGNOSIS — H6982 Other specified disorders of Eustachian tube, left ear: Secondary | ICD-10-CM

## 2016-02-27 DIAGNOSIS — H6121 Impacted cerumen, right ear: Secondary | ICD-10-CM

## 2016-02-27 MED ORDER — FLUTICASONE PROPIONATE 50 MCG/ACT NA SUSP: 2.0000 | Freq: Every day | NASAL | 1 refills | Status: DC

## 2016-02-27 NOTE — Patient Instructions (Addendum)
For your wax obstruction rt side  we did lavage of your ear today. Close to 100% clearance of wax  For your left side ear pressure sensation I am prescribing flonase nasal spray. You likely have eustachian tube dysfunction.  For future wax obstruction can pre-treat with debrox otc and then follow up for lavage.  Follow up as needed. If you left side does not normalize please let us know.   Barotitis Media Barotitis media is inflammation of your middle ear. This occurs when the auditory tube (eustachian tube) leading from the back of your nose (nasopharynx) to your eardrum is blocked. This blockage may result from a cold, environmental allergies, or an upper respiratory infection. Unresolved barotitis media may lead to damage or hearing loss (barotrauma), which may become permanent. HOME CARE INSTRUCTIONS   Use medicines as recommended by your health care provider. Over-the-counter medicines will help unblock the canal and can help during times of air travel.  Do not put anything into your ears to clean or unplug them. Eardrops will not be helpful.  Do not swim, dive, or fly until your health care provider says it is all right to do so. If these activities are necessary, chewing gum with frequent, forceful swallowing may help. It is also helpful to hold your nose and gently blow to pop your ears for equalizing pressure changes. This forces air into the eustachian tube.  Only take over-the-counter or prescription medicines for pain, discomfort, or fever as directed by your health care provider.  A decongestant may be helpful in decongesting the middle ear and make pressure equalization easier. SEEK MEDICAL CARE IF:  You experience a serious form of dizziness in which you feel as if the room is spinning and you feel nauseated (vertigo).  Your symptoms only involve one ear. SEEK IMMEDIATE MEDICAL CARE IF:   You develop a severe headache, dizziness, or severe ear pain.  You have bloody or  pus-like drainage from your ears.  You develop a fever.  Your problems do not improve or become worse. MAKE SURE YOU:   Understand these instructions.  Will watch your condition.  Will get help right away if you are not doing well or get worse.   This information is not intended to replace advice given to you by your health care provider. Make sure you discuss any questions you have with your health care provider.   Document Released: 04/20/2000 Document Revised: 02/11/2013 Document Reviewed: 11/18/2012 Elsevier Interactive Patient Education [employer]2016 Elsevier Inc.

## 2016-02-27 NOTE — Progress Notes (Signed)
Pre visit review using our clinic review tool, if applicable. No additional management support is needed unless otherwise documented below in the visit note. 

## 2016-02-27 NOTE — Progress Notes (Signed)
Subjective:    Patient ID: [name], female    DOB: [birth year], 81 y.o.   MRN: [id number]  HPI   Pt in with some both ears feeling blocked. Left ear feels more blocked over past 3-4 days. Some faint nasal congestion at best. She states maybe some but down plays symptoms.   Pt left side feels worse than rt side.But rt side actually has wax. She has used debrox to her ears. No wax has come out.  Pt has no fever, no chills, no sweats and no bodyaches. No st. Only complaints are rt side.  No uri or allery symptoms. No severe ear pain. Just states feel blocked with some pressure.   Review of Systems  Constitutional: Negative for chills, fatigue and fever.  HENT: Negative for congestion, ear pain, postnasal drip, rhinorrhea, sinus pressure, sore throat and tinnitus.        Ear pressure and blocked sensation. Lavage of rt side and she states seemed to help left side pressure sensation?  Cardiovascular: Negative for chest pain and palpitations.  Gastrointestinal: Negative for abdominal pain.  Skin: Negative for rash.  Neurological: Negative for dizziness and headaches.  Hematological: Negative for adenopathy. Does not bruise/bleed easily.  Psychiatric/Behavioral: Negative for agitation and confusion.    Past Medical History:  Diagnosis Date  . Abscess, rectum   . Diabetes mellitus    type 2  . Fatty liver disease, nonalcoholic   . Hyperlipidemia   . Hypertension   . Thyroid nodule      Social History   Social History  . Marital status: Married    Spouse name: N/A  . Number of children: N/A  . Years of education: N/A   Occupational History  . retired Runner, broadcasting/film/video    Social History Main Topics  . Smoking status: Never Smoker  . Smokeless tobacco: Never Used  . Alcohol use No  . Drug use: No  . Sexual activity: Yes    Partners: Male   Other Topics Concern  . Not on file   Social History Narrative  . No narrative on file    Past Surgical History:    Procedure Laterality Date  . ABDOMINAL HYSTERECTOMY    . ANAL FISSURE REPAIR    . CATARACT EXTRACTION, BILATERAL  Feb  . EYE SURGERY  06/2013   cataract b/l  . HEMORRHOID SURGERY    . TONSILLECTOMY AND ADENOIDECTOMY      Family History  Problem Relation Age of Onset  . Diabetes    . Hypertension    . Other Brother     Rhabdo secondary to Statin  . Diabetes Brother   . Hypertension Brother   . Diabetes Mother   . Hypertension Mother   . Hypertension Sister   . Mental retardation Sister     Allergies  Allergen Reactions  . Sulfonamide Derivatives Other (See Comments)    headache    Current Outpatient Prescriptions on File Prior to Visit  Medication Sig Dispense Refill  . aspirin 81 MG tablet Take 81 mg by mouth daily.      . diazepam (VALIUM) 5 MG tablet Take 1 tablet (5 mg total) by mouth daily. 45 tablet 1  . losartan (COZAAR) 100 MG tablet TAKE 1 TABLET BY MOUTH DAILY 90 tablet 3  . metoprolol succinate (TOPROL XL) 50 MG 24 hr tablet Take 1 tablet (50 mg total) by mouth daily. Take with or immediately following a meal. 90 tablet 3  . nystatin ointment (MYCOSTATIN)  Apply 1 application topically 2 (two) times daily. 30 g 0  . potassium chloride SA (K-DUR,KLOR-CON) 20 MEQ tablet TAKE 1 TABLET BY MOUTH ONCE DAILY 90 tablet 3  . simvastatin (ZOCOR) 20 MG tablet Take 1 tablet (20 mg total) by mouth at bedtime. 90 tablet 3  . triamterene-hydrochlorothiazide (MAXZIDE) 75-50 MG tablet Take 0.5 tablets by mouth daily. 45 tablet 3   No current facility-administered medications on file prior to visit.     BP 128/84 (BP Location: Right Arm)   Pulse 75   Temp 98 F (36.7 C) (Oral)   Ht 5\' 3"  (1.6 m)   Wt 149 lb (67.6 kg)   SpO2 98%   BMI 26.39 kg/m       Objective:   Physical Exam  General  Mental Status - Alert. General Appearance - Well groomed. Not in acute distress.  Skin Rashes- No Rashes.  HEENT Head- Normal. Ear Auditory Canal - Left- Normal. Right -  Normal.Tympanic Membrane- Left- clear and normal tm.  Right- prelavage large amount of wax. Post lavage wax cleared. Virtually 100% of wax cleared Eye Sclera/Conjunctiva- Left- Normal. Right- Normal. Nose & Sinuses Nasal Mucosa- Left-  Not Boggy and Congested. Right-   Not Boggy and  Congested.Bilateral no maxillary and  No frontal sinus pressure. Mouth & Throat Lips: Upper Lip- Normal: no dryness, cracking, pallor, cyanosis, or vesicular eruption. Lower Lip-Normal: no dryness, cracking, pallor, cyanosis or vesicular eruption. Buccal Mucosa- Bilateral- No Aphthous ulcers. Oropharynx- No Discharge or Erythema. Tonsils: Characteristics- Bilateral- No Erythema or Congestion. Size/Enlargement- Bilateral- No enlargement. Discharge- bilateral-None.  Neck Neck- Supple. No Masses.   Chest and Lung Exam Auscultation: Breath Sounds:-Clear even and unlabored.  Cardiovascular Auscultation:Rythm- Regular, rate and rhythm. Murmurs & Other Heart Sounds:Ausculatation of the heart reveal- No Murmurs.  Lymphatic Head & Neck General Head & Neck Lymphatics: Bilateral: Description- No Localized lymphadenopathy.       Assessment & Plan:  For your wax obstruction rt side  we did lavage of your ear today. Close to 100% clearance of wax  For your left side ear pressure sensation I am prescribing flonase nasal spray. You likely have eustachian tube dysfunction.  For future wax obstruction can pre-treat with debrox otc and then follow up for lavage.  Follow up as needed. If you left side does not normalize please let us know.

## 2016-03-02 DIAGNOSIS — H04123 Dry eye syndrome of bilateral lacrimal glands: Secondary | ICD-10-CM | POA: Diagnosis not present

## 2016-03-05 ENCOUNTER — Other Ambulatory Visit: Payer: Self-pay | Admitting: Family Medicine

## 2016-03-05 DIAGNOSIS — I10 Essential (primary) hypertension: Secondary | ICD-10-CM

## 2016-04-03 ENCOUNTER — Other Ambulatory Visit: Payer: Self-pay | Admitting: Family Medicine

## 2016-04-03 DIAGNOSIS — I10 Essential (primary) hypertension: Secondary | ICD-10-CM

## 2016-04-03 DIAGNOSIS — E785 Hyperlipidemia, unspecified: Secondary | ICD-10-CM

## 2016-04-10 ENCOUNTER — Encounter: Payer: Medicare Other | Admitting: Family Medicine

## 2016-05-08 ENCOUNTER — Other Ambulatory Visit: Payer: Self-pay | Admitting: Family Medicine

## 2016-05-08 DIAGNOSIS — I10 Essential (primary) hypertension: Secondary | ICD-10-CM

## 2016-05-29 ENCOUNTER — Other Ambulatory Visit: Payer: Self-pay | Admitting: Family Medicine

## 2016-05-29 DIAGNOSIS — I10 Essential (primary) hypertension: Secondary | ICD-10-CM

## 2016-06-20 ENCOUNTER — Telehealth: Payer: Self-pay | Admitting: Family Medicine

## 2016-06-20 NOTE — Telephone Encounter (Signed)
Patient scheduled medicare wellness appointment with PCP 06/21/16 at 10:30am

## 2016-06-21 ENCOUNTER — Encounter: Payer: Self-pay | Admitting: Family Medicine

## 2016-06-21 ENCOUNTER — Ambulatory Visit (INDEPENDENT_AMBULATORY_CARE_PROVIDER_SITE_OTHER): Payer: Medicare Other | Admitting: Family Medicine

## 2016-06-21 VITALS — BP 120/86 | HR 68 | Temp 98.0°F | Resp 16 | Ht 62.5 in | Wt 145.2 lb

## 2016-06-21 DIAGNOSIS — E119 Type 2 diabetes mellitus without complications: Secondary | ICD-10-CM

## 2016-06-21 DIAGNOSIS — E785 Hyperlipidemia, unspecified: Secondary | ICD-10-CM | POA: Diagnosis not present

## 2016-06-21 DIAGNOSIS — Z23 Encounter for immunization: Secondary | ICD-10-CM

## 2016-06-21 DIAGNOSIS — F411 Generalized anxiety disorder: Secondary | ICD-10-CM

## 2016-06-21 DIAGNOSIS — Z0000 Encounter for general adult medical examination without abnormal findings: Secondary | ICD-10-CM | POA: Diagnosis not present

## 2016-06-21 DIAGNOSIS — I10 Essential (primary) hypertension: Secondary | ICD-10-CM

## 2016-06-21 LAB — CBC WITH DIFFERENTIAL/PLATELET
BASOS PCT: 1.1 % (ref 0.0–3.0)
Basophils Absolute: 0.1 10*3/uL (ref 0.0–0.1)
EOS PCT: 1 % (ref 0.0–5.0)
Eosinophils Absolute: 0.1 10*3/uL (ref 0.0–0.7)
HCT: 46.3 % — ABNORMAL HIGH (ref 36.0–46.0)
Hemoglobin: 15.6 g/dL — ABNORMAL HIGH (ref 12.0–15.0)
LYMPHS ABS: 1.7 10*3/uL (ref 0.7–4.0)
Lymphocytes Relative: 29.6 % (ref 12.0–46.0)
MCHC: 33.7 g/dL (ref 30.0–36.0)
MCV: 83.9 fl (ref 78.0–100.0)
MONOS PCT: 7.4 % (ref 3.0–12.0)
Monocytes Absolute: 0.4 10*3/uL (ref 0.1–1.0)
NEUTROS ABS: 3.6 10*3/uL (ref 1.4–7.7)
NEUTROS PCT: 60.9 % (ref 43.0–77.0)
PLATELETS: 241 10*3/uL (ref 150.0–400.0)
RBC: 5.51 Mil/uL — ABNORMAL HIGH (ref 3.87–5.11)
RDW: 13.2 % (ref 11.5–15.5)
WBC: 5.9 10*3/uL (ref 4.0–10.5)

## 2016-06-21 LAB — COMPREHENSIVE METABOLIC PANEL
ALBUMIN: 4.7 g/dL (ref 3.5–5.2)
ALK PHOS: 70 U/L (ref 39–117)
ALT: 25 U/L (ref 0–35)
AST: 26 U/L (ref 0–37)
BILIRUBIN TOTAL: 1.3 mg/dL — AB (ref 0.2–1.2)
BUN: 20 mg/dL (ref 6–23)
CALCIUM: 10.3 mg/dL (ref 8.4–10.5)
CO2: 26 meq/L (ref 19–32)
Chloride: 103 mEq/L (ref 96–112)
Creatinine, Ser: 0.98 mg/dL (ref 0.40–1.20)
GFR: 58.08 mL/min — ABNORMAL LOW (ref >60.00)
Glucose, Bld: 120 mg/dL — ABNORMAL HIGH (ref 70–99)
Potassium: 3.8 mEq/L (ref 3.5–5.1)
Sodium: 138 mEq/L (ref 135–145)
Total Protein: 7.6 g/dL (ref 6.0–8.3)

## 2016-06-21 LAB — HEMOGLOBIN A1C: Hgb A1c MFr Bld: 6.1 % (ref 4.6–6.5)

## 2016-06-21 LAB — LIPID PANEL
CHOL/HDL RATIO: 2
CHOLESTEROL: 163 mg/dL (ref 0–200)
HDL: 73.9 mg/dL (ref >39.00)
LDL Cholesterol: 73 mg/dL (ref 0–99)
NonHDL: 89.23
TRIGLYCERIDES: 83 mg/dL (ref 0.0–149.0)
VLDL: 16.6 mg/dL (ref 0.0–40.0)

## 2016-06-21 MED ORDER — SIMVASTATIN 20 MG PO TABS: 20.0000 mg | ORAL_TABLET | Freq: Every day | ORAL | 1 refills | Status: DC

## 2016-06-21 MED ORDER — METOPROLOL SUCCINATE ER 50 MG PO TB24: 50.0000 mg | ORAL_TABLET | Freq: Every day | ORAL | 1 refills | Status: DC

## 2016-06-21 MED ORDER — TRIAMTERENE-HCTZ 75-50 MG PO TABS: 0.5000 | ORAL_TABLET | Freq: Every day | ORAL | 1 refills | Status: DC

## 2016-06-21 MED ORDER — DIAZEPAM 5 MG PO TABS: 5.0000 mg | ORAL_TABLET | Freq: Every day | ORAL | 1 refills | Status: DC

## 2016-06-21 NOTE — Progress Notes (Signed)
Pre visit review using our clinic review tool, if applicable. No additional management support is needed unless otherwise documented below in the visit note. 

## 2016-06-21 NOTE — Patient Instructions (Signed)
Preventive Care 80 Years and Older, Female Preventive care refers to lifestyle choices and visits with your health care provider that can promote health and wellness. What does preventive care include?  A yearly physical exam. This is also called an annual well check.  Dental exams once or twice a year.  Routine eye exams. Ask your health care provider how often you should have your eyes checked.  Personal lifestyle choices, including:  Daily care of your teeth and gums.  Regular physical activity.  Eating a healthy diet.  Avoiding tobacco and drug use.  Limiting alcohol use.  Practicing safe sex.  Taking low-dose aspirin every day.  Taking vitamin and mineral supplements as recommended by your health care provider. What happens during an annual well check? The services and screenings done by your health care provider during your annual well check will depend on your age, overall health, lifestyle risk factors, and family history of disease. Counseling  Your health care provider may ask you questions about your:  Alcohol use.  Tobacco use.  Drug use.  Emotional well-being.  Home and relationship well-being.  Sexual activity.  Eating habits.  History of falls.  Memory and ability to understand (cognition).  Work and work environment.  Reproductive health. Screening  You may have the following tests or measurements:  Height, weight, and BMI.  Blood pressure.  Lipid and cholesterol levels. These may be checked every 5 years, or more frequently if you are over 50 years old.  Skin check.  Lung cancer screening. You may have this screening every year starting at age 55 if you have a 30-pack-year history of smoking and currently smoke or have quit within the past 15 years.  Fecal occult blood test (FOBT) of the stool. You may have this test every year starting at age 50.  Flexible sigmoidoscopy or colonoscopy. You may have a sigmoidoscopy every 5 years or  a colonoscopy every 10 years starting at age 50.  Hepatitis C blood test.  Hepatitis B blood test.  Sexually transmitted disease (STD) testing.  Diabetes screening. This is done by checking your blood sugar (glucose) after you have not eaten for a while (fasting). You may have this done every 1-3 years.  Bone density scan. This is done to screen for osteoporosis. You may have this done starting at age 80.  Mammogram. This may be done every 1-2 years. Talk to your health care provider about how often you should have regular mammograms. Talk with your health care provider about your test results, treatment options, and if necessary, the need for more tests. Vaccines  Your health care provider may recommend certain vaccines, such as:  Influenza vaccine. This is recommended every year.  Tetanus, diphtheria, and acellular pertussis (Tdap, Td) vaccine. You may need a Td booster every 10 years.  Varicella vaccine. You may need this if you have not been vaccinated.  Zoster vaccine. You may need this after age 60.  Measles, mumps, and rubella (MMR) vaccine. You may need at least one dose of MMR if you were born in 1957 or later. You may also need a second dose.  Pneumococcal 13-valent conjugate (PCV13) vaccine. One dose is recommended after age 80.  Pneumococcal polysaccharide (PPSV23) vaccine. One dose is recommended after age 80.  Meningococcal vaccine. You may need this if you have certain conditions.  Hepatitis A vaccine. You may need this if you have certain conditions or if you travel or work in places where you may be exposed to   hepatitis A.  Hepatitis B vaccine. You may need this if you have certain conditions or if you travel or work in places where you may be exposed to hepatitis B.  Haemophilus influenzae type b (Hib) vaccine. You may need this if you have certain conditions. Talk to your health care provider about which screenings and vaccines you need and how often you need  them. This information is not intended to replace advice given to you by your health care provider. Make sure you discuss any questions you have with your health care provider. Document Released: 05/20/2015 Document Revised: 01/11/2016 Document Reviewed: 02/22/2015 Elsevier Interactive Patient Education  2017 [employer]  

## 2016-06-21 NOTE — Progress Notes (Signed)
Subjective:   [name] is a 82 y.o. female who presents for Medicare Annual (Subsequent) preventive examination.  Review of Systems:   Review of Systems  Constitutional: Negative for activity change, appetite change and fatigue.  HENT: Negative for hearing loss, congestion, tinnitus and ear discharge.   Eyes: Negative for visual disturbance (see optho q1y -- vision corrected to 20/20 with glasses).  Respiratory: Negative for cough, chest tightness and shortness of breath.   Cardiovascular: Negative for chest pain, palpitations and leg swelling.  Gastrointestinal: Negative for abdominal pain, diarrhea, constipation and abdominal distention.  Genitourinary: Negative for urgency, frequency, decreased urine volume and difficulty urinating.  Musculoskeletal: Negative for back pain, arthralgias and gait problem.  Skin: Negative for color change, pallor and rash.  Neurological: Negative for dizziness, light-headedness, numbness and headaches.  Hematological: Negative for adenopathy. Does not bruise/bleed easily.  Psychiatric/Behavioral: Negative for suicidal ideas, confusion, sleep disturbance, self-injury, dysphoric mood, decreased concentration and agitation.  Pt is able to read and write and can do all ADLs No risk for falling No abuse/ violence in home         Objective:     Vitals: BP 120/86 (BP Location: Left Arm, Cuff Size: Normal)   Pulse 68   Temp 98 F (36.7 C) (Oral)   Resp 16   Ht 5' 2.5" (1.588 m)   Wt 145 lb 3.2 oz (65.9 kg)   SpO2 98%   BMI 26.13 kg/m   Body mass index is 26.13 kg/m. BP 120/86 (BP Location: Left Arm, Cuff Size: Normal)   Pulse 68   Temp 98 F (36.7 C) (Oral)   Resp 16   Ht 5' 2.5" (1.588 m)   Wt 145 lb 3.2 oz (65.9 kg)   SpO2 98%   BMI 26.13 kg/m  General appearance: alert, cooperative, appears stated age and no distress Head: Normocephalic, without obvious abnormality, atraumatic Eyes: conjunctivae/corneas clear. PERRL, EOM's  intact. Fundi benign. Ears: normal TM's and external ear canals both ears Nose: Nares normal. Septum midline. Mucosa normal. No drainage or sinus tenderness. Throat: lips, mucosa, and tongue normal; teeth and gums normal Neck: no adenopathy, no carotid bruit, no JVD, supple, symmetrical, trachea midline and thyroid not enlarged, symmetric, no tenderness/mass/nodules Back: symmetric, no curvature. ROM normal. No CVA tenderness. Lungs: clear to auscultation bilaterally Breasts: normal appearance, no masses or tenderness Heart: regular rate and rhythm, S1, S2 normal, no murmur, click, rub or gallop Abdomen: soft, non-tender; bowel sounds normal; no masses,  no organomegaly Extremities: extremities normal, atraumatic, no cyanosis or edema Pulses: 2+ and symmetric Skin: Skin color, texture, turgor normal. No rashes or lesions Lymph nodes: Cervical, supraclavicular, and axillary nodes normal. Neurologic: Alert and oriented X 3, normal strength and tone. Normal symmetric reflexes. Normal coordination and gait  Tobacco History  Smoking Status  . Never Smoker  Smokeless Tobacco  . Never Used     Counseling given: Not Answered   Past Medical History:  Diagnosis Date  . Abscess, rectum   . Diabetes mellitus    type 2  . Fatty liver disease, nonalcoholic   . Hyperlipidemia   . Hypertension   . Thyroid nodule    Past Surgical History:  Procedure Laterality Date  . ABDOMINAL HYSTERECTOMY    . ANAL FISSURE REPAIR    . CATARACT EXTRACTION, BILATERAL  Feb  . EYE SURGERY  06/2013   cataract b/l  . HEMORRHOID SURGERY    . TONSILLECTOMY AND ADENOIDECTOMY     Family  History  Problem Relation Age of Onset  . Other Brother     Rhabdo secondary to Statin  . Diabetes Brother   . Hypertension Brother   . Diabetes Mother   . Hypertension Mother   . Hypertension Sister   . Mental retardation Sister   . Diabetes    . Hypertension     History  Sexual Activity  . Sexual activity: Yes  .  Partners: Male    Outpatient Encounter Prescriptions as of 06/21/2016  Medication Sig  . aspirin 81 MG tablet Take 81 mg by mouth daily.    . Calcium-Vitamin D-Vitamin K (VIACTIV) 500-500-40 MG-UNT-MCG CHEW Chew 2 each by mouth daily.  . diazepam (VALIUM) 5 MG tablet Take 1 tablet (5 mg total) by mouth daily.  . fluticasone (FLONASE) 50 MCG/ACT nasal spray Place 2 sprays into both nostrils daily.  Marland Kitchen losartan (COZAAR) 100 MG tablet TAKE 1 TABLET BY MOUTH DAILY  . metoprolol succinate (TOPROL-XL) 50 MG 24 hr tablet Take 1 tablet (50 mg total) by mouth daily. Take with or immediately following a meal.  . simvastatin (ZOCOR) 20 MG tablet Take 1 tablet (20 mg total) by mouth at bedtime.  . triamterene-hydrochlorothiazide (MAXZIDE) 75-50 MG tablet Take 0.5 tablets by mouth daily.  . [DISCONTINUED] diazepam (VALIUM) 5 MG tablet Take 1 tablet (5 mg total) by mouth daily.  . [DISCONTINUED] metoprolol succinate (TOPROL-XL) 50 MG 24 hr tablet TAKE 1 TABLET BY MOUTH EVERY DAY  . [DISCONTINUED] simvastatin (ZOCOR) 20 MG tablet Take 1 tablet (20 mg total) by mouth at bedtime.  . [DISCONTINUED] triamterene-hydrochlorothiazide (MAXZIDE) 75-50 MG tablet TAKE 1/2 TABLET BY MOUTH DAILY  . [DISCONTINUED] losartan (COZAAR) 100 MG tablet TAKE 1 TABLET BY MOUTH DAILY  . [DISCONTINUED] nystatin ointment (MYCOSTATIN) Apply 1 application topically 2 (two) times daily.  . [DISCONTINUED] potassium chloride SA (K-DUR,KLOR-CON) 20 MEQ tablet TAKE 1 TABLET BY MOUTH ONCE DAILY  . [DISCONTINUED] simvastatin (ZOCOR) 20 MG tablet TAKE 1 TABLET(20 MG) BY MOUTH AT BEDTIME  . [DISCONTINUED] triamterene-hydrochlorothiazide (MAXZIDE) 75-50 MG tablet Take 0.5 tablets by mouth daily.   No facility-administered encounter medications on file as of 06/21/2016.     Activities of Daily Living In your present state of health, do you have any difficulty performing the following activities: 06/21/2016  Hearing? Y  Vision? Y  Difficulty  concentrating or making decisions? N  Walking or climbing stairs? Y  Dressing or bathing? N  Doing errands, shopping? N  Some recent data might be hidden    Patient Care Team: Donato Schultz, DO as PCP - General Nelson Chimes, MD as Consulting Physician (Ophthalmology)    Assessment:      Exercise Activities and Dietary recommendations Current Exercise Habits: Home exercise routine, Type of exercise: walking, Time (Minutes): 25, Frequency (Times/Week): 5, Weekly Exercise (Minutes/Week): 125, Intensity: Mild, Exercise limited by: None identified  Goals    None     Fall Risk Fall Risk  06/21/2016 02/03/2016 02/03/2015 02/03/2015 10/13/2013  Falls in the past year? No Yes No No No  Number falls in past yr: - 1 - - -  Injury with Fall? - No - - -  Risk Factor Category  - - - - -   Depression Screen PHQ 2/9 Scores 06/21/2016 02/03/2016 02/03/2015 02/03/2015  PHQ - 2 Score 0 0 0 0     Cognitive Function        Immunization History  Administered Date(s) Administered  . Influenza Split 02/09/2011, 03/06/2012  .  Influenza Whole 02/24/2008  . Influenza, High Dose Seasonal PF 06/21/2016  . Pneumococcal Conjugate-13 02/03/2015  . Pneumococcal Polysaccharide-23 05/17/2004  . Td 05/17/2004   Screening Tests Health Maintenance  Topic Date Due  . TETANUS/TDAP  05/17/2014  . OPHTHALMOLOGY EXAM  12/29/2015  . HEMOGLOBIN A1C  12/19/2016  . FOOT EXAM  06/21/2017  . INFLUENZA VACCINE  Completed  . DEXA SCAN  Completed  . ZOSTAVAX  Addressed  . PNA vac Low Risk Adult  Completed      Plan:    see AVS During the course of the visit the patient was educated and counseled about the following appropriate screening and preventive services:   Vaccines to include Pneumoccal, Influenza, Hepatitis B, Td, Zostavax, HCV  Electrocardiogram  Cardiovascular Disease  Colorectal cancer screening  Bone density screening  Diabetes screening  Glaucoma  screening  Mammography/PAP  Nutrition counseling   Patient Instructions (the written plan) was given to the patient.  1. Essential hypertension stable - triamterene-hydrochlorothiazide (MAXZIDE) 75-50 MG tablet; Take 0.5 tablets by mouth daily.  Dispense: 45 tablet; Refill: 1 - metoprolol succinate (TOPROL-XL) 50 MG 24 hr tablet; Take 1 tablet (50 mg total) by mouth daily. Take with or immediately following a meal.  Dispense: 90 tablet; Refill: 1 - Comprehensive metabolic panel - CBC with Differential/Platelet  2. Hyperlipidemia, unspecified hyperlipidemia type Check labs - simvastatin (ZOCOR) 20 MG tablet; Take 1 tablet (20 mg total) by mouth at bedtime.  Dispense: 90 tablet; Refill: 1 - Lipid panel - CBC with Differential/Platelet  3. Generalized anxiety disorder  - diazepam (VALIUM) 5 MG tablet; Take 1 tablet (5 mg total) by mouth daily.  Dispense: 45 tablet; Refill: 1  4. Encounter for Medicare annual wellness exam See above  5. Influenza vaccine administered   - Flu vaccine HIGH DOSE PF (Fluzone High Dose)  6. Controlled type 2 diabetes mellitus without complication, without long-term current use of insulin (HCC) Check labs - Hemoglobin A1c - POCT Urinalysis Dipstick (Automated)  Donato SchultzYvonne R [name] Chase, DO  06/24/2016

## 2016-06-26 ENCOUNTER — Other Ambulatory Visit: Payer: Self-pay | Admitting: Family Medicine

## 2016-06-26 DIAGNOSIS — I10 Essential (primary) hypertension: Secondary | ICD-10-CM

## 2016-07-06 DIAGNOSIS — H524 Presbyopia: Secondary | ICD-10-CM | POA: Diagnosis not present

## 2016-07-06 DIAGNOSIS — E119 Type 2 diabetes mellitus without complications: Secondary | ICD-10-CM | POA: Diagnosis not present

## 2016-07-06 DIAGNOSIS — H26493 Other secondary cataract, bilateral: Secondary | ICD-10-CM | POA: Diagnosis not present

## 2016-07-06 DIAGNOSIS — H04123 Dry eye syndrome of bilateral lacrimal glands: Secondary | ICD-10-CM | POA: Diagnosis not present

## 2016-08-27 ENCOUNTER — Other Ambulatory Visit: Payer: Self-pay | Admitting: Family Medicine

## 2016-09-06 ENCOUNTER — Other Ambulatory Visit: Payer: Self-pay | Admitting: Medical

## 2016-09-18 DIAGNOSIS — H04123 Dry eye syndrome of bilateral lacrimal glands: Secondary | ICD-10-CM | POA: Diagnosis not present

## 2016-09-18 DIAGNOSIS — H26493 Other secondary cataract, bilateral: Secondary | ICD-10-CM | POA: Diagnosis not present

## 2016-09-18 DIAGNOSIS — E119 Type 2 diabetes mellitus without complications: Secondary | ICD-10-CM | POA: Diagnosis not present

## 2016-09-18 DIAGNOSIS — H524 Presbyopia: Secondary | ICD-10-CM | POA: Diagnosis not present

## 2016-09-18 DIAGNOSIS — H26492 Other secondary cataract, left eye: Secondary | ICD-10-CM | POA: Diagnosis not present

## 2016-09-18 DIAGNOSIS — H26491 Other secondary cataract, right eye: Secondary | ICD-10-CM | POA: Diagnosis not present

## 2016-11-03 ENCOUNTER — Other Ambulatory Visit: Payer: Self-pay | Admitting: Family Medicine

## 2016-11-03 DIAGNOSIS — I10 Essential (primary) hypertension: Secondary | ICD-10-CM

## 2016-11-09 ENCOUNTER — Other Ambulatory Visit: Payer: Self-pay | Admitting: Family Medicine

## 2016-11-09 DIAGNOSIS — I10 Essential (primary) hypertension: Secondary | ICD-10-CM

## 2016-11-26 ENCOUNTER — Other Ambulatory Visit: Payer: Self-pay | Admitting: Family Medicine

## 2016-12-25 ENCOUNTER — Ambulatory Visit: Payer: Medicare Other | Admitting: Family Medicine

## 2017-01-02 ENCOUNTER — Other Ambulatory Visit: Payer: Self-pay | Admitting: Family Medicine

## 2017-01-02 DIAGNOSIS — I10 Essential (primary) hypertension: Secondary | ICD-10-CM

## 2017-01-22 ENCOUNTER — Ambulatory Visit: Payer: Medicare Other | Admitting: Family Medicine

## 2017-01-22 ENCOUNTER — Other Ambulatory Visit: Payer: Self-pay | Admitting: Family Medicine

## 2017-01-22 DIAGNOSIS — F411 Generalized anxiety disorder: Secondary | ICD-10-CM

## 2017-01-22 DIAGNOSIS — E785 Hyperlipidemia, unspecified: Secondary | ICD-10-CM

## 2017-01-22 NOTE — Telephone Encounter (Signed)
Sent req to PCP on Diazepam/approved Simvastatin/thx dmf

## 2017-01-22 NOTE — Telephone Encounter (Signed)
Requesting: Diazepam Contract: 6.17.14 UDS: 6.17.14 low risk no newer on file Last OV: 2.15.18 Next OV: 11.06.18 Last Refill:  5.31.18   Please advise

## 2017-01-22 NOTE — Telephone Encounter (Signed)
Need database Refill x1 Need uds and contract

## 2017-01-23 NOTE — Telephone Encounter (Signed)
YL-I'm sorry but I am unable to find anyone who can access the database/I did call the pharmacy who checked it and verified last fill date of 5.31.18 for Diazepam/plz advise if ok to print/thx dmf

## 2017-01-25 ENCOUNTER — Other Ambulatory Visit: Payer: Self-pay | Admitting: Family Medicine

## 2017-01-25 DIAGNOSIS — F411 Generalized anxiety disorder: Secondary | ICD-10-CM

## 2017-01-25 NOTE — Telephone Encounter (Signed)
This was faxed on 9.19.18/thx dmf

## 2017-02-25 ENCOUNTER — Other Ambulatory Visit: Payer: Self-pay | Admitting: Family Medicine

## 2017-02-26 NOTE — Telephone Encounter (Signed)
Rx approved and sent to the pharmacy by e-script.//AB/CMA 

## 2017-03-12 ENCOUNTER — Encounter: Payer: Self-pay | Admitting: Family Medicine

## 2017-03-12 ENCOUNTER — Ambulatory Visit (INDEPENDENT_AMBULATORY_CARE_PROVIDER_SITE_OTHER): Payer: Medicare Other | Admitting: Family Medicine

## 2017-03-12 VITALS — BP 138/71 | HR 86 | Temp 97.8°F | Resp 16 | Ht 63.0 in | Wt 145.8 lb

## 2017-03-12 DIAGNOSIS — E785 Hyperlipidemia, unspecified: Secondary | ICD-10-CM | POA: Diagnosis not present

## 2017-03-12 DIAGNOSIS — F411 Generalized anxiety disorder: Secondary | ICD-10-CM

## 2017-03-12 DIAGNOSIS — Z23 Encounter for immunization: Secondary | ICD-10-CM | POA: Diagnosis not present

## 2017-03-12 DIAGNOSIS — E118 Type 2 diabetes mellitus with unspecified complications: Secondary | ICD-10-CM | POA: Diagnosis not present

## 2017-03-12 DIAGNOSIS — E119 Type 2 diabetes mellitus without complications: Secondary | ICD-10-CM

## 2017-03-12 DIAGNOSIS — I10 Essential (primary) hypertension: Secondary | ICD-10-CM

## 2017-03-12 LAB — LIPID PANEL
CHOLESTEROL: 150 mg/dL (ref 0–200)
HDL: 67.5 mg/dL (ref >39.00)
LDL CALC: 66 mg/dL (ref 0–99)
NonHDL: 82.85
TRIGLYCERIDES: 83 mg/dL (ref 0.0–149.0)
Total CHOL/HDL Ratio: 2
VLDL: 16.6 mg/dL (ref 0.0–40.0)

## 2017-03-12 LAB — COMPREHENSIVE METABOLIC PANEL
ALBUMIN: 4.5 g/dL (ref 3.5–5.2)
ALT: 20 U/L (ref 0–35)
AST: 21 U/L (ref 0–37)
Alkaline Phosphatase: 65 U/L (ref 39–117)
BILIRUBIN TOTAL: 1.1 mg/dL (ref 0.2–1.2)
BUN: 24 mg/dL — ABNORMAL HIGH (ref 6–23)
CALCIUM: 10.5 mg/dL (ref 8.4–10.5)
CHLORIDE: 103 meq/L (ref 96–112)
CO2: 28 mEq/L (ref 19–32)
CREATININE: 1.22 mg/dL — AB (ref 0.40–1.20)
GFR: 45.02 mL/min — ABNORMAL LOW (ref >60.00)
Glucose, Bld: 116 mg/dL — ABNORMAL HIGH (ref 70–99)
Potassium: 4.2 mEq/L (ref 3.5–5.1)
Sodium: 141 mEq/L (ref 135–145)
Total Protein: 7.4 g/dL (ref 6.0–8.3)

## 2017-03-12 LAB — HEMOGLOBIN A1C: HEMOGLOBIN A1C: 6 % (ref 4.6–6.5)

## 2017-03-12 MED ORDER — TRIAMTERENE-HCTZ 75-50 MG PO TABS: 0.5000 | ORAL_TABLET | Freq: Every day | ORAL | 0 refills | Status: DC

## 2017-03-12 MED ORDER — DIAZEPAM 5 MG PO TABS: 5.0000 mg | ORAL_TABLET | Freq: Every day | ORAL | 2 refills | Status: DC

## 2017-03-12 MED ORDER — SIMVASTATIN 20 MG PO TABS: ORAL_TABLET | ORAL | 3 refills | Status: DC

## 2017-03-12 MED ORDER — POTASSIUM CHLORIDE CRYS ER 20 MEQ PO TBCR: 20.0000 meq | EXTENDED_RELEASE_TABLET | Freq: Every day | ORAL | 3 refills | Status: DC

## 2017-03-12 MED ORDER — METOPROLOL SUCCINATE ER 50 MG PO TB24: 50.0000 mg | ORAL_TABLET | Freq: Every day | ORAL | 0 refills | Status: DC

## 2017-03-12 MED ORDER — LOSARTAN POTASSIUM 100 MG PO TABS: 100.0000 mg | ORAL_TABLET | Freq: Every day | ORAL | 3 refills | Status: DC

## 2017-03-12 NOTE — Progress Notes (Signed)
Patient ID: [name], female   DOB: 06-Jan-1935, 82 y.o.   MRN: [id number]     Subjective:  I acted as a Neurosurgeon for Dr. Zola Button.  Apolonio Schneiders, CMA   Patient ID: [name], female    DOB: 1934-05-12, 82 y.o.   MRN: [id number]  Chief Complaint  Patient presents with  . Hypertension    HPI  Patient is in today for follow up blood pressure.  She has been doing well on current treatment.  She also needs labs and refills.  She is requesting a flu shot.    Patient Care Team: Zola Button, Grayling Congress, DO as PCP - General Nelson Chimes, MD as Consulting Physician (Ophthalmology)   Past Medical History:  Diagnosis Date  . Abscess, rectum   . Diabetes mellitus    type 2  . Fatty liver disease, nonalcoholic   . Hyperlipidemia   . Hypertension   . Thyroid nodule     Past Surgical History:  Procedure Laterality Date  . ABDOMINAL HYSTERECTOMY    . ANAL FISSURE REPAIR    . CATARACT EXTRACTION, BILATERAL  Feb  . EYE SURGERY  06/2013   cataract b/l  . HEMORRHOID SURGERY    . TONSILLECTOMY AND ADENOIDECTOMY      Family History  Problem Relation Age of Onset  . Other Brother        Rhabdo secondary to Statin  . Diabetes Brother   . Hypertension Brother   . Diabetes Mother   . Hypertension Mother   . Hypertension Sister   . Mental retardation Sister   . Diabetes Unknown   . Hypertension Unknown     Social History   Socioeconomic History  . Marital status: Married    Spouse name: Not on file  . Number of children: Not on file  . Years of education: Not on file  . Highest education level: Not on file  Social Needs  . Financial resource strain: Not on file  . Food insecurity - worry: Not on file  . Food insecurity - inability: Not on file  . Transportation needs - medical: Not on file  . Transportation needs - non-medical: Not on file  Occupational History  . Occupation: retired Runner, broadcasting/film/video  Tobacco Use  . Smoking status: Never Smoker  . Smokeless tobacco:  Never Used  Substance and Sexual Activity  . Alcohol use: No  . Drug use: No  . Sexual activity: Yes    Partners: Male  Other Topics Concern  . Not on file  Social History Narrative   Exercise- walks everyday   Lives with husband    Outpatient Medications Prior to Visit  Medication Sig Dispense Refill  . aspirin 81 MG tablet Take 81 mg by mouth daily.      . Calcium-Vitamin D-Vitamin K (VIACTIV) 500-500-40 MG-UNT-MCG CHEW Chew 2 each by mouth daily.    . fluticasone (FLONASE) 50 MCG/ACT nasal spray SHAKE LIQUID AND USE 2 SPRAYS IN EACH NOSTRIL DAILY 16 g 0  . diazepam (VALIUM) 5 MG tablet TAKE 1 TABLET BY MOUTH DAILY 45 tablet 0  . losartan (COZAAR) 100 MG tablet TAKE 1 TABLET BY MOUTH DAILY 90 tablet 0  . metoprolol succinate (TOPROL-XL) 50 MG 24 hr tablet Take 1 tablet (50 mg total) by mouth daily. Take with or immediately following a meal. 90 tablet 0  . potassium chloride SA (K-DUR,KLOR-CON) 20 MEQ tablet TAKE 1 TABLET BY MOUTH EVERY DAY 90 tablet 0  . simvastatin (ZOCOR) 20  MG tablet TAKE 1 TABLET(20 MG) BY MOUTH AT BEDTIME 90 tablet 0  . triamterene-hydrochlorothiazide (MAXZIDE) 75-50 MG tablet Take 0.5 tablets by mouth daily. 45 tablet 0   No facility-administered medications prior to visit.     Allergies  Allergen Reactions  . Sulfonamide Derivatives Other (See Comments)    headache    Review of Systems  Constitutional: Negative for chills, fever and malaise/fatigue.  HENT: Negative for congestion and hearing loss.   Eyes: Negative for blurred vision and discharge.  Respiratory: Negative for cough, sputum production and shortness of breath.   Cardiovascular: Negative for chest pain, palpitations and leg swelling.  Gastrointestinal: Negative for abdominal pain, blood in stool, constipation, diarrhea, heartburn, nausea and vomiting.  Genitourinary: Negative for dysuria, frequency, hematuria and urgency.  Musculoskeletal: Negative for back pain, falls and myalgias.    Skin: Negative for rash.  Neurological: Negative for dizziness, sensory change, loss of consciousness, weakness and headaches.  Endo/Heme/Allergies: Negative for environmental allergies. Does not bruise/bleed easily.  Psychiatric/Behavioral: Negative for depression and suicidal ideas. The patient is not nervous/anxious and does not have insomnia.        Objective:    Physical Exam  Constitutional: She is oriented to person, place, and time. She appears well-developed and well-nourished.  HENT:  Head: Normocephalic and atraumatic.  Eyes: Conjunctivae and EOM are normal.  Neck: Normal range of motion. Neck supple. No JVD present. Carotid bruit is not present. No thyromegaly present.  Cardiovascular: Normal rate, regular rhythm and normal heart sounds.  No murmur heard. Pulmonary/Chest: Effort normal and breath sounds normal. No respiratory distress. She has no wheezes. She has no rales. She exhibits no tenderness.  Musculoskeletal: She exhibits no edema.  Neurological: She is alert and oriented to person, place, and time.  Psychiatric: She has a normal mood and affect.  Nursing note and vitals reviewed.   BP 138/71   Pulse 86   Temp 97.8 F (36.6 C) (Oral)   Resp 16   Ht 5\' 3"  (1.6 m)   Wt 145 lb 12.8 oz (66.1 kg)   SpO2 98%   BMI 25.83 kg/m  Wt Readings from Last 3 Encounters:  03/12/17 145 lb 12.8 oz (66.1 kg)  06/21/16 145 lb 3.2 oz (65.9 kg)  02/27/16 149 lb (67.6 kg)   BP Readings from Last 3 Encounters:  03/12/17 138/71  06/21/16 120/86  02/27/16 128/84     Immunization History  Administered Date(s) Administered  . Influenza Split 02/09/2011, 03/06/2012  . Influenza Whole 02/24/2008  . Influenza, High Dose Seasonal PF 06/21/2016  . Pneumococcal Conjugate-13 02/03/2015  . Pneumococcal Polysaccharide-23 05/17/2004  . Td 05/17/2004    Health Maintenance  Topic Date Due  . TETANUS/TDAP  05/17/2014  . OPHTHALMOLOGY EXAM  12/29/2015  . INFLUENZA VACCINE   12/05/2016  . HEMOGLOBIN A1C  12/19/2016  . FOOT EXAM  06/21/2017  . DEXA SCAN  Completed  . PNA vac Low Risk Adult  Completed    Lab Results  Component Value Date   WBC 5.9 06/21/2016   HGB 15.6 (H) 06/21/2016   HCT 46.3 (H) 06/21/2016   PLT 241.0 06/21/2016   GLUCOSE 120 (H) 06/21/2016   CHOL 163 06/21/2016   TRIG 83.0 06/21/2016   HDL 73.90 06/21/2016   LDLCALC 73 06/21/2016   ALT 25 06/21/2016   AST 26 06/21/2016   NA 138 06/21/2016   K 3.8 06/21/2016   CL 103 06/21/2016   CREATININE 0.98 06/21/2016   BUN 20 06/21/2016  CO2 26 06/21/2016   TSH 3.46 02/03/2008   INR 1.04 06/02/2010   HGBA1C 6.1 06/21/2016   MICROALBUR <0.7 01/27/2015    Lab Results  Component Value Date   TSH 3.46 02/03/2008   Lab Results  Component Value Date   WBC 5.9 06/21/2016   HGB 15.6 (H) 06/21/2016   HCT 46.3 (H) 06/21/2016   MCV 83.9 06/21/2016   PLT 241.0 06/21/2016   Lab Results  Component Value Date   NA 138 06/21/2016   K 3.8 06/21/2016   CO2 26 06/21/2016   GLUCOSE 120 (H) 06/21/2016   BUN 20 06/21/2016   CREATININE 0.98 06/21/2016   BILITOT 1.3 (H) 06/21/2016   ALKPHOS 70 06/21/2016   AST 26 06/21/2016   ALT 25 06/21/2016   PROT 7.6 06/21/2016   ALBUMIN 4.7 06/21/2016   CALCIUM 10.3 06/21/2016   GFR 58.08 (L) 06/21/2016   Lab Results  Component Value Date   CHOL 163 06/21/2016   Lab Results  Component Value Date   HDL 73.90 06/21/2016   Lab Results  Component Value Date   LDLCALC 73 06/21/2016   Lab Results  Component Value Date   TRIG 83.0 06/21/2016   Lab Results  Component Value Date   CHOLHDL 2 06/21/2016   Lab Results  Component Value Date   HGBA1C 6.1 06/21/2016         Assessment & Plan:   Problem List Items Addressed This Visit      Unprioritized   Diabetes type 2, controlled (HCC)    Diet controlled Check labs      Relevant Medications   losartan (COZAAR) 100 MG tablet   simvastatin (ZOCOR) 20 MG tablet   Essential  hypertension - Primary    Well controlled, no changes to meds. Encouraged heart healthy diet such as the DASH diet and exercise as tolerated.       Relevant Medications   losartan (COZAAR) 100 MG tablet   metoprolol succinate (TOPROL-XL) 50 MG 24 hr tablet   potassium chloride SA (K-DUR,KLOR-CON) 20 MEQ tablet   simvastatin (ZOCOR) 20 MG tablet   triamterene-hydrochlorothiazide (MAXZIDE) 75-50 MG tablet   Other Relevant Orders   Comprehensive metabolic panel   Lipid panel   Hemoglobin A1c   Hyperlipidemia    Tolerating statin, encouraged heart healthy diet, avoid trans fats, minimize simple carbs and saturated fats. Increase exercise as tolerated      Relevant Medications   losartan (COZAAR) 100 MG tablet   metoprolol succinate (TOPROL-XL) 50 MG 24 hr tablet   simvastatin (ZOCOR) 20 MG tablet   triamterene-hydrochlorothiazide (MAXZIDE) 75-50 MG tablet    Other Visit Diagnoses    Hyperlipidemia LDL goal <100       Relevant Medications   losartan (COZAAR) 100 MG tablet   metoprolol succinate (TOPROL-XL) 50 MG 24 hr tablet   simvastatin (ZOCOR) 20 MG tablet   triamterene-hydrochlorothiazide (MAXZIDE) 75-50 MG tablet   Other Relevant Orders   Comprehensive metabolic panel   Lipid panel   Hemoglobin A1c   Generalized anxiety disorder       Relevant Medications   diazepam (VALIUM) 5 MG tablet   Diet-controlled diabetes mellitus (HCC)       Relevant Medications   losartan (COZAAR) 100 MG tablet   simvastatin (ZOCOR) 20 MG tablet   Other Relevant Orders   Hemoglobin A1c      I have changed Elisha Headland. Kaczorowski's diazepam, losartan, metoprolol succinate, potassium chloride SA, and triamterene-hydrochlorothiazide. I am also having  her maintain her aspirin, Calcium-Vitamin D-Vitamin K, fluticasone, and simvastatin.  Meds ordered this encounter  Medications  . diazepam (VALIUM) 5 MG tablet    Sig: Take 1 tablet (5 mg total) daily by mouth.    Dispense:  45 tablet    Refill:   2  . losartan (COZAAR) 100 MG tablet    Sig: Take 1 tablet (100 mg total) daily by mouth.    Dispense:  90 tablet    Refill:  3  . metoprolol succinate (TOPROL-XL) 50 MG 24 hr tablet    Sig: Take 1 tablet (50 mg total) daily by mouth. Take with or immediately following a meal.    Dispense:  90 tablet    Refill:  0  . potassium chloride SA (K-DUR,KLOR-CON) 20 MEQ tablet    Sig: Take 1 tablet (20 mEq total) daily by mouth.    Dispense:  90 tablet    Refill:  3  . simvastatin (ZOCOR) 20 MG tablet    Sig: TAKE 1 TABLET(20 MG) BY MOUTH AT BEDTIME    Dispense:  90 tablet    Refill:  3  . triamterene-hydrochlorothiazide (MAXZIDE) 75-50 MG tablet    Sig: Take 0.5 tablets daily by mouth.    Dispense:  45 tablet    Refill:  0    CMA served as scribe during this visit. History, Physical and Plan performed by medical provider. Documentation and orders reviewed and attested to.  Donato SchultzYvonne R [name] Chase, DO

## 2017-03-12 NOTE — Assessment & Plan Note (Signed)
Tolerating statin, encouraged heart healthy diet, avoid trans fats, minimize simple carbs and saturated fats. Increase exercise as tolerated 

## 2017-03-12 NOTE — Assessment & Plan Note (Signed)
Well controlled, no changes to meds. Encouraged heart healthy diet such as the DASH diet and exercise as tolerated.  °

## 2017-03-12 NOTE — Patient Instructions (Signed)

## 2017-03-12 NOTE — Assessment & Plan Note (Signed)
Diet controlled. Check labs.  

## 2017-03-13 NOTE — Addendum Note (Signed)
Addended by: Thelma BargeICHARDSON, Caedin Mogan D on: 03/13/2017 08:30 AM   Modules accepted: Orders

## 2017-03-14 NOTE — Addendum Note (Signed)
Addended byConrad Finley: Carlia Bomkamp D on: 03/14/2017 02:48 PM   Modules accepted: Orders

## 2017-07-02 DIAGNOSIS — H04123 Dry eye syndrome of bilateral lacrimal glands: Secondary | ICD-10-CM | POA: Diagnosis not present

## 2017-07-02 DIAGNOSIS — H524 Presbyopia: Secondary | ICD-10-CM | POA: Diagnosis not present

## 2017-07-02 DIAGNOSIS — E119 Type 2 diabetes mellitus without complications: Secondary | ICD-10-CM | POA: Diagnosis not present

## 2017-07-04 ENCOUNTER — Other Ambulatory Visit: Payer: Self-pay | Admitting: Family Medicine

## 2017-07-04 DIAGNOSIS — I10 Essential (primary) hypertension: Secondary | ICD-10-CM

## 2017-08-06 ENCOUNTER — Other Ambulatory Visit: Payer: Self-pay | Admitting: Family Medicine

## 2017-08-06 DIAGNOSIS — I10 Essential (primary) hypertension: Secondary | ICD-10-CM

## 2017-09-12 ENCOUNTER — Ambulatory Visit: Payer: Medicare Other | Admitting: *Deleted

## 2017-09-12 ENCOUNTER — Ambulatory Visit: Payer: Medicare Other | Admitting: Family Medicine

## 2017-09-18 NOTE — Progress Notes (Deleted)
Subjective:   [name] is a 83 y.o. female who presents for Medicare Annual (Subsequent) preventive examination.  Review of Systems: No ROS.  Medicare Wellness Visit. Additional risk factors are reflected in the social history.   Sleep patterns:  Home Safety/Smoke Alarms: Feels safe in home. Smoke alarms in place.  Living environment; residence and Firearm Safety:    Female:      Mammo-       Dexa scan-        CCS-No longer doing routine screening due to age.     Objective:     Vitals: There were no vitals taken for this visit.  There is no height or weight on file to calculate BMI.  Advanced Directives 06/21/2016  Does Patient Have a Medical Advance Directive? Yes  Type of Advance Directive Living will    Tobacco Social History   Tobacco Use  Smoking Status Never Smoker  Smokeless Tobacco Never Used     Counseling given: Not Answered   Clinical Intake:                       Past Medical History:  Diagnosis Date  . Abscess, rectum   . Diabetes mellitus    type 2  . Fatty liver disease, nonalcoholic   . Hyperlipidemia   . Hypertension   . Thyroid nodule    Past Surgical History:  Procedure Laterality Date  . ABDOMINAL HYSTERECTOMY    . ANAL FISSURE REPAIR    . CATARACT EXTRACTION, BILATERAL  Feb  . EYE SURGERY  06/2013   cataract b/l  . HEMORRHOID SURGERY    . TONSILLECTOMY AND ADENOIDECTOMY     Family History  Problem Relation Age of Onset  . Other Brother        Rhabdo secondary to Statin  . Diabetes Brother   . Hypertension Brother   . Diabetes Mother   . Hypertension Mother   . Hypertension Sister   . Mental retardation Sister   . Diabetes Unknown   . Hypertension Unknown    Social History   Socioeconomic History  . Marital status: Married    Spouse name: Not on file  . Number of children: Not on file  . Years of education: Not on file  . Highest education level: Not on file  Occupational History  .  Occupation: retired Data processing manager  . Financial resource strain: Not on file  . Food insecurity:    Worry: Not on file    Inability: Not on file  . Transportation needs:    Medical: Not on file    Non-medical: Not on file  Tobacco Use  . Smoking status: Never Smoker  . Smokeless tobacco: Never Used  Substance and Sexual Activity  . Alcohol use: No  . Drug use: No  . Sexual activity: Yes    Partners: Male  Lifestyle  . Physical activity:    Days per week: Not on file    Minutes per session: Not on file  . Stress: Not on file  Relationships  . Social connections:    Talks on phone: Not on file    Gets together: Not on file    Attends religious service: Not on file    Active member of club or organization: Not on file    Attends meetings of clubs or organizations: Not on file    Relationship status: Not on file  Other Topics Concern  . Not on  file  Social History Narrative   Exercise- walks everyday   Lives with husband    Outpatient Encounter Medications as of 09/24/2017  Medication Sig  . aspirin 81 MG tablet Take 81 mg by mouth daily.    . Calcium-Vitamin D-Vitamin K (VIACTIV) 500-500-40 MG-UNT-MCG CHEW Chew 2 each by mouth daily.  . diazepam (VALIUM) 5 MG tablet Take 1 tablet (5 mg total) daily by mouth.  . fluticasone (FLONASE) 50 MCG/ACT nasal spray SHAKE LIQUID AND USE 2 SPRAYS IN EACH NOSTRIL DAILY  . losartan (COZAAR) 100 MG tablet Take 1 tablet (100 mg total) daily by mouth.  . metoprolol succinate (TOPROL-XL) 50 MG 24 hr tablet TAKE 1 TABLET BY MOUTH EVERY DAY WITH OR IMMEDIATELY FOLLOWING A MEAL  . potassium chloride SA (K-DUR,KLOR-CON) 20 MEQ tablet Take 1 tablet (20 mEq total) daily by mouth.  . simvastatin (ZOCOR) 20 MG tablet TAKE 1 TABLET(20 MG) BY MOUTH AT BEDTIME  . triamterene-hydrochlorothiazide (MAXZIDE) 75-50 MG tablet TAKE 1/2 TABLET BY MOUTH DAILY   No facility-administered encounter medications on file as of 09/24/2017.     Activities of  Daily Living No flowsheet data found.  Patient Care Team: Zola Button, Grayling Congress, DO as PCP - General Nelson Chimes, MD as Consulting Physician (Ophthalmology)    Assessment:   This is a routine wellness examination for [name]. Physical assessment deferred to PCP.  Exercise Activities and Dietary recommendations   Diet (meal preparation, eat out, water intake, caffeinated beverages, dairy products, fruits and vegetables): {Desc; diets:16563} Breakfast: Lunch:  Dinner:      Goals    None      Fall Risk Fall Risk  06/21/2016 02/03/2016 02/03/2015 02/03/2015 10/13/2013  Falls in the past year? No Yes No No No  Number falls in past yr: - 1 - - -  Comment - patient report tripping over a step that goes into her kitchen. - - -  Injury with Fall? - No - - -  Risk Factor Category  - - - - -  Comment - - - - -    Depression Screen PHQ 2/9 Scores 06/21/2016 02/03/2016 02/03/2015 02/03/2015  PHQ - 2 Score 0 0 0 0     Cognitive Function        Immunization History  Administered Date(s) Administered  . Influenza Split 02/09/2011, 03/06/2012  . Influenza Whole 02/24/2008  . Influenza, High Dose Seasonal PF 06/21/2016, 03/12/2017  . Pneumococcal Conjugate-13 02/03/2015  . Pneumococcal Polysaccharide-23 05/17/2004  . Td 05/17/2004    Screening Tests Health Maintenance  Topic Date Due  . TETANUS/TDAP  05/17/2014  . OPHTHALMOLOGY EXAM  12/29/2015  . FOOT EXAM  06/21/2017  . HEMOGLOBIN A1C  09/09/2017  . INFLUENZA VACCINE  12/05/2017  . DEXA SCAN  Completed  . PNA vac Low Risk Adult  Completed      Plan:   ***   I have personally reviewed and noted the following in the patient's chart:   . Medical and social history . Use of alcohol, tobacco or illicit drugs  . Current medications and supplements . Functional ability and status . Nutritional status . Physical activity . Advanced directives . List of other physicians . Hospitalizations, surgeries, and ER visits in  previous 12 months . Vitals . Screenings to include cognitive, depression, and falls . Referrals and appointments  In addition, I have reviewed and discussed with patient certain preventive protocols, quality metrics, and best practice recommendations. A written personalized care plan for preventive services  as well as general preventive health recommendations were provided to patient.     Avon Gully, [place]  09/18/2017

## 2017-09-24 ENCOUNTER — Ambulatory Visit: Payer: Medicare Other | Admitting: *Deleted

## 2017-09-24 ENCOUNTER — Other Ambulatory Visit: Payer: Self-pay | Admitting: Family Medicine

## 2017-09-24 ENCOUNTER — Ambulatory Visit: Payer: Medicare Other | Admitting: Family Medicine

## 2017-09-24 DIAGNOSIS — I10 Essential (primary) hypertension: Secondary | ICD-10-CM

## 2017-11-21 ENCOUNTER — Ambulatory Visit (INDEPENDENT_AMBULATORY_CARE_PROVIDER_SITE_OTHER): Payer: Medicare Other | Admitting: Family Medicine

## 2017-11-21 ENCOUNTER — Encounter: Payer: Self-pay | Admitting: Family Medicine

## 2017-11-21 VITALS — BP 138/88 | HR 88 | Temp 98.4°F | Resp 16 | Ht 63.0 in | Wt 147.4 lb

## 2017-11-21 DIAGNOSIS — E1169 Type 2 diabetes mellitus with other specified complication: Secondary | ICD-10-CM | POA: Diagnosis not present

## 2017-11-21 DIAGNOSIS — H9192 Unspecified hearing loss, left ear: Secondary | ICD-10-CM

## 2017-11-21 DIAGNOSIS — E118 Type 2 diabetes mellitus with unspecified complications: Secondary | ICD-10-CM

## 2017-11-21 DIAGNOSIS — E785 Hyperlipidemia, unspecified: Secondary | ICD-10-CM | POA: Diagnosis not present

## 2017-11-21 DIAGNOSIS — J302 Other seasonal allergic rhinitis: Secondary | ICD-10-CM | POA: Diagnosis not present

## 2017-11-21 DIAGNOSIS — I10 Essential (primary) hypertension: Secondary | ICD-10-CM | POA: Diagnosis not present

## 2017-11-21 LAB — COMPREHENSIVE METABOLIC PANEL
ALBUMIN: 4.6 g/dL (ref 3.5–5.2)
ALK PHOS: 65 U/L (ref 39–117)
ALT: 23 U/L (ref 0–35)
AST: 22 U/L (ref 0–37)
BUN: 19 mg/dL (ref 6–23)
CO2: 30 mEq/L (ref 19–32)
Calcium: 9.9 mg/dL (ref 8.4–10.5)
Chloride: 103 mEq/L (ref 96–112)
Creatinine, Ser: 1.1 mg/dL (ref 0.40–1.20)
GFR: 50.65 mL/min — ABNORMAL LOW (ref >60.00)
Glucose, Bld: 112 mg/dL — ABNORMAL HIGH (ref 70–99)
POTASSIUM: 3.8 meq/L (ref 3.5–5.1)
SODIUM: 141 meq/L (ref 135–145)
Total Bilirubin: 1.1 mg/dL (ref 0.2–1.2)
Total Protein: 7.1 g/dL (ref 6.0–8.3)

## 2017-11-21 LAB — LIPID PANEL
CHOL/HDL RATIO: 2
CHOLESTEROL: 157 mg/dL (ref 0–200)
HDL: 73.3 mg/dL (ref >39.00)
LDL CALC: 67 mg/dL (ref 0–99)
NonHDL: 83.52
Triglycerides: 85 mg/dL (ref 0.0–149.0)
VLDL: 17 mg/dL (ref 0.0–40.0)

## 2017-11-21 LAB — HEMOGLOBIN A1C: Hgb A1c MFr Bld: 6.1 % (ref 4.6–6.5)

## 2017-11-21 MED ORDER — FLUTICASONE PROPIONATE 50 MCG/ACT NA SUSP: NASAL | 5 refills | Status: DC

## 2017-11-21 NOTE — Assessment & Plan Note (Signed)
Stable Check labs hgba1c to be checked, minimize simple carbs. Increase exercise as tolerated. Continue current meds

## 2017-11-21 NOTE — Patient Instructions (Signed)

## 2017-11-21 NOTE — Assessment & Plan Note (Signed)
Well controlled, no changes to meds. Encouraged heart healthy diet such as the DASH diet and exercise as tolerated.  °

## 2017-11-21 NOTE — Progress Notes (Signed)
Patient ID: [name], female   DOB: 1934-12-23, 83 y.o.   MRN: [id number]     Subjective:  I acted as a Neurosurgeonscribe for Dr. Zola ButtonLowne-Chase.  Apolonio SchneidersSheketia, CMA   Patient ID: [name], female    DOB: 1934-12-23, 83 y.o.   MRN: [id number]  Chief Complaint  Patient presents with  . Hypertension  . Diabetes  . Hyperlipidemia    HPI  Patient is in today for follow up blood pressure, cholesterol, and diabetes.    HYPERTENSION   Blood pressure range-not checking   Chest pain- no      Dyspnea- no Lightheadedness- no   Edema- no  Other side effects - no   Medication compliance: good Low salt diet- yes    DIABETES    Blood Sugar ranges-not checking   Polyuria- no New Visual problems- no  Hypoglycemic symptoms- no  Other side effects-no Medication compliance - good Last eye exam- recent  Foot exam- today   HYPERLIPIDEMIA  Medication compliance- good  RUQ pain- no  Muscle aches- no Other side effects-no  Patient Care Team: Donato SchultzLowne Chase, Topher Buenaventura R, DO as PCP - General Nelson Chimesigby, Donald, MD as Consulting Physician (Ophthalmology)   Past Medical History:  Diagnosis Date  . Abscess, rectum   . Diabetes mellitus    type 2  . Fatty liver disease, nonalcoholic   . Hyperlipidemia   . Hypertension   . Thyroid nodule     Past Surgical History:  Procedure Laterality Date  . ABDOMINAL HYSTERECTOMY    . ANAL FISSURE REPAIR    . CATARACT EXTRACTION, BILATERAL  Feb  . EYE SURGERY  06/2013   cataract b/l  . HEMORRHOID SURGERY    . TONSILLECTOMY AND ADENOIDECTOMY      Family History  Problem Relation Age of Onset  . Other Brother        Rhabdo secondary to Statin  . Diabetes Brother   . Hypertension Brother   . Diabetes Mother   . Hypertension Mother   . Hypertension Sister   . Mental retardation Sister   . Diabetes Unknown   . Hypertension Unknown     Social History   Socioeconomic History  . Marital status: Married    Spouse name: Not on file  . Number of  children: Not on file  . Years of education: Not on file  . Highest education level: Not on file  Occupational History  . Occupation: retired Data processing managerteacher  Social Needs  . Financial resource strain: Not on file  . Food insecurity:    Worry: Not on file    Inability: Not on file  . Transportation needs:    Medical: Not on file    Non-medical: Not on file  Tobacco Use  . Smoking status: Never Smoker  . Smokeless tobacco: Never Used  Substance and Sexual Activity  . Alcohol use: No  . Drug use: No  . Sexual activity: Yes    Partners: Male  Lifestyle  . Physical activity:    Days per week: Not on file    Minutes per session: Not on file  . Stress: Not on file  Relationships  . Social connections:    Talks on phone: Not on file    Gets together: Not on file    Attends religious service: Not on file    Active member of club or organization: Not on file    Attends meetings of clubs or organizations: Not on file    Relationship status:  Not on file  . Intimate partner violence:    Fear of current or ex partner: Not on file    Emotionally abused: Not on file    Physically abused: Not on file    Forced sexual activity: Not on file  Other Topics Concern  . Not on file  Social History Narrative   Exercise- walks everyday   Lives with husband    Outpatient Medications Prior to Visit  Medication Sig Dispense Refill  . aspirin 81 MG tablet Take 81 mg by mouth daily.      . Calcium-Vitamin D-Vitamin K (VIACTIV) 500-500-40 MG-UNT-MCG CHEW Chew 2 each by mouth daily.    . diazepam (VALIUM) 5 MG tablet Take 1 tablet (5 mg total) daily by mouth. 45 tablet 2  . losartan (COZAAR) 100 MG tablet Take 1 tablet (100 mg total) daily by mouth. 90 tablet 3  . metoprolol succinate (TOPROL-XL) 50 MG 24 hr tablet TAKE 1 TABLET BY MOUTH EVERY DAY WITH OR IMMEDIATELY FOLLOWING A MEAL 90 tablet 1  . potassium chloride SA (K-DUR,KLOR-CON) 20 MEQ tablet Take 1 tablet (20 mEq total) daily by mouth. 90  tablet 3  . simvastatin (ZOCOR) 20 MG tablet TAKE 1 TABLET(20 MG) BY MOUTH AT BEDTIME 90 tablet 3  . triamterene-hydrochlorothiazide (MAXZIDE) 75-50 MG tablet TAKE 1/2 TABLET BY MOUTH DAILY 45 tablet 0  . fluticasone (FLONASE) 50 MCG/ACT nasal spray SHAKE LIQUID AND USE 2 SPRAYS IN EACH NOSTRIL DAILY 16 g 0   No facility-administered medications prior to visit.     Allergies  Allergen Reactions  . Sulfonamide Derivatives Other (See Comments)    headache    Review of Systems  Constitutional: Negative for fever and malaise/fatigue.  HENT: Negative for congestion.   Eyes: Negative for blurred vision.  Respiratory: Negative for cough and shortness of breath.   Cardiovascular: Negative for chest pain, palpitations and leg swelling.  Gastrointestinal: Negative for vomiting.  Musculoskeletal: Negative for back pain.  Skin: Negative for rash.  Neurological: Negative for loss of consciousness and headaches.       Objective:    Physical Exam  Constitutional: She is oriented to person, place, and time. She appears well-developed and well-nourished.  HENT:  Head: Normocephalic and atraumatic.  Right Ear: External ear normal.  Left Ear: External ear normal.  Nose: Nose normal.  Mouth/Throat: Oropharynx is clear and moist.  Eyes: Conjunctivae and EOM are normal.  Neck: Normal range of motion. Neck supple. No JVD present. Carotid bruit is not present. No thyromegaly present.  Cardiovascular: Normal rate, regular rhythm and normal heart sounds.  No murmur heard. Pulmonary/Chest: Effort normal and breath sounds normal. No respiratory distress. She has no wheezes. She has no rales. She exhibits no tenderness.  Musculoskeletal: She exhibits no edema.  Neurological: She is alert and oriented to person, place, and time.  Psychiatric: She has a normal mood and affect.  Nursing note and vitals reviewed.  Diabetic Foot Exam - Simple   No data filed       BP 138/88 (BP Location: Right Arm,  Cuff Size: Normal)   Pulse 88   Temp 98.4 F (36.9 C) (Oral)   Resp 16   Ht 5\' 3"  (1.6 m)   Wt 147 lb 6.4 oz (66.9 kg)   SpO2 96%   BMI 26.11 kg/m  Wt Readings from Last 3 Encounters:  11/21/17 147 lb 6.4 oz (66.9 kg)  03/12/17 145 lb 12.8 oz (66.1 kg)  06/21/16 145 lb 3.2  oz (65.9 kg)   BP Readings from Last 3 Encounters:  11/21/17 138/88  03/12/17 138/71  06/21/16 120/86     Immunization History  Administered Date(s) Administered  . Influenza Split 02/09/2011, 03/06/2012  . Influenza Whole 02/24/2008  . Influenza, High Dose Seasonal PF 06/21/2016, 03/12/2017  . Pneumococcal Conjugate-13 02/03/2015  . Pneumococcal Polysaccharide-23 05/17/2004  . Td 05/17/2004    Health Maintenance  Topic Date Due  . TETANUS/TDAP  05/17/2014  . OPHTHALMOLOGY EXAM  12/29/2015  . FOOT EXAM  06/21/2017  . HEMOGLOBIN A1C  09/09/2017  . INFLUENZA VACCINE  12/05/2017  . DEXA SCAN  Completed  . PNA vac Low Risk Adult  Completed    Lab Results  Component Value Date   WBC 5.9 06/21/2016   HGB 15.6 (H) 06/21/2016   HCT 46.3 (H) 06/21/2016   PLT 241.0 06/21/2016   GLUCOSE 116 (H) 03/12/2017   CHOL 150 03/12/2017   TRIG 83.0 03/12/2017   HDL 67.50 03/12/2017   LDLCALC 66 03/12/2017   ALT 20 03/12/2017   AST 21 03/12/2017   NA 141 03/12/2017   K 4.2 03/12/2017   CL 103 03/12/2017   CREATININE 1.22 (H) 03/12/2017   BUN 24 (H) 03/12/2017   CO2 28 03/12/2017   TSH 3.46 02/03/2008   INR 1.04 06/02/2010   HGBA1C 6.0 03/12/2017   MICROALBUR <0.7 01/27/2015    Lab Results  Component Value Date   TSH 3.46 02/03/2008   Lab Results  Component Value Date   WBC 5.9 06/21/2016   HGB 15.6 (H) 06/21/2016   HCT 46.3 (H) 06/21/2016   MCV 83.9 06/21/2016   PLT 241.0 06/21/2016   Lab Results  Component Value Date   NA 141 03/12/2017   K 4.2 03/12/2017   CO2 28 03/12/2017   GLUCOSE 116 (H) 03/12/2017   BUN 24 (H) 03/12/2017   CREATININE 1.22 (H) 03/12/2017   BILITOT 1.1 03/12/2017    ALKPHOS 65 03/12/2017   AST 21 03/12/2017   ALT 20 03/12/2017   PROT 7.4 03/12/2017   ALBUMIN 4.5 03/12/2017   CALCIUM 10.5 03/12/2017   GFR 45.02 (L) 03/12/2017   Lab Results  Component Value Date   CHOL 150 03/12/2017   Lab Results  Component Value Date   HDL 67.50 03/12/2017   Lab Results  Component Value Date   LDLCALC 66 03/12/2017   Lab Results  Component Value Date   TRIG 83.0 03/12/2017   Lab Results  Component Value Date   CHOLHDL 2 03/12/2017   Lab Results  Component Value Date   HGBA1C 6.0 03/12/2017         Assessment & Plan:   Problem List Items Addressed This Visit      Unprioritized   Essential hypertension    Well controlled, no changes to meds. Encouraged heart healthy diet such as the DASH diet and exercise as tolerated.       Relevant Orders   Comprehensive metabolic panel   Hyperlipidemia associated with type 2 diabetes mellitus (HCC) - Primary    Tolerating statin, encouraged heart healthy diet, avoid trans fats, minimize simple carbs and saturated fats. Increase exercise as tolerated      Relevant Orders   Comprehensive metabolic panel   Lipid panel   Seasonal allergies   Relevant Medications   fluticasone (FLONASE) 50 MCG/ACT nasal spray   Type 2 diabetes mellitus with complication, without long-term current use of insulin (HCC)    Stable Check labs hgba1c to be  checked, minimize simple carbs. Increase exercise as tolerated. Continue current meds       Relevant Orders   Hemoglobin A1c   Comprehensive metabolic panel    Other Visit Diagnoses    Hearing loss of left ear, unspecified hearing loss type       Relevant Orders   Ambulatory referral to ENT      I am having Elisha Headland. Botkins maintain her aspirin, Calcium-Vitamin D-Vitamin K, diazepam, losartan, potassium chloride SA, simvastatin, metoprolol succinate, triamterene-hydrochlorothiazide, and fluticasone.  Meds ordered this encounter  Medications  .  fluticasone (FLONASE) 50 MCG/ACT nasal spray    Sig: SHAKE LIQUID AND USE 2 SPRAYS IN EACH NOSTRIL DAILY    Dispense:  16 g    Refill:  5    CMA served as scribe during this visit. History, Physical and Plan performed by medical provider. Documentation and orders reviewed and attested to.  Donato Schultz, DO

## 2017-11-21 NOTE — Assessment & Plan Note (Signed)
Tolerating statin, encouraged heart healthy diet, avoid trans fats, minimize simple carbs and saturated fats. Increase exercise as tolerated 

## 2017-11-25 DIAGNOSIS — Z961 Presence of intraocular lens: Secondary | ICD-10-CM | POA: Diagnosis not present

## 2017-11-25 DIAGNOSIS — H04123 Dry eye syndrome of bilateral lacrimal glands: Secondary | ICD-10-CM | POA: Diagnosis not present

## 2017-11-25 DIAGNOSIS — S0502XA Injury of conjunctiva and corneal abrasion without foreign body, left eye, initial encounter: Secondary | ICD-10-CM | POA: Diagnosis not present

## 2017-11-28 DIAGNOSIS — H04123 Dry eye syndrome of bilateral lacrimal glands: Secondary | ICD-10-CM | POA: Diagnosis not present

## 2017-11-28 DIAGNOSIS — Z961 Presence of intraocular lens: Secondary | ICD-10-CM | POA: Diagnosis not present

## 2017-11-28 DIAGNOSIS — S0502XD Injury of conjunctiva and corneal abrasion without foreign body, left eye, subsequent encounter: Secondary | ICD-10-CM | POA: Diagnosis not present

## 2017-12-09 ENCOUNTER — Other Ambulatory Visit: Payer: Self-pay | Admitting: Family Medicine

## 2017-12-09 DIAGNOSIS — F411 Generalized anxiety disorder: Secondary | ICD-10-CM

## 2017-12-10 NOTE — Telephone Encounter (Signed)
Refill Request: Diazepam  Last RX:1116/18 Last OV:11/21/17 Next OV:05/29/18 UDS: CSC:

## 2017-12-11 NOTE — Telephone Encounter (Signed)
rx called in

## 2017-12-24 ENCOUNTER — Other Ambulatory Visit: Payer: Self-pay | Admitting: Family Medicine

## 2017-12-24 DIAGNOSIS — I10 Essential (primary) hypertension: Secondary | ICD-10-CM

## 2018-01-23 ENCOUNTER — Ambulatory Visit: Payer: Medicare Other | Admitting: *Deleted

## 2018-02-03 ENCOUNTER — Other Ambulatory Visit: Payer: Self-pay | Admitting: Family Medicine

## 2018-02-03 DIAGNOSIS — I10 Essential (primary) hypertension: Secondary | ICD-10-CM

## 2018-02-04 ENCOUNTER — Telehealth: Payer: Self-pay | Admitting: *Deleted

## 2018-02-04 NOTE — Telephone Encounter (Signed)
I have attempted to call pt several times to reschedule her appt for her Medicare Wellness Visit on 02/07/18. The pt does not have a voice mail and the phone continues to ring before going to busy. The pt's emergency contact is her husband with the same phone number. I will continue to attempt until the date of the appt.

## 2018-02-07 ENCOUNTER — Ambulatory Visit: Payer: Medicare Other | Admitting: *Deleted

## 2018-02-25 ENCOUNTER — Other Ambulatory Visit: Payer: Self-pay | Admitting: Family Medicine

## 2018-02-25 DIAGNOSIS — I10 Essential (primary) hypertension: Secondary | ICD-10-CM

## 2018-03-18 ENCOUNTER — Ambulatory Visit: Payer: Medicare Other | Admitting: *Deleted

## 2018-03-18 DIAGNOSIS — H9193 Unspecified hearing loss, bilateral: Secondary | ICD-10-CM | POA: Diagnosis not present

## 2018-03-18 DIAGNOSIS — H6983 Other specified disorders of Eustachian tube, bilateral: Secondary | ICD-10-CM | POA: Diagnosis not present

## 2018-03-21 ENCOUNTER — Other Ambulatory Visit: Payer: Self-pay

## 2018-03-25 ENCOUNTER — Other Ambulatory Visit: Payer: Self-pay | Admitting: Family Medicine

## 2018-03-25 DIAGNOSIS — F411 Generalized anxiety disorder: Secondary | ICD-10-CM

## 2018-03-27 NOTE — Telephone Encounter (Signed)
Database ran and is on your desk for review.  Last filled per database: 02/11/18 Last written: 12/10/17 Last ov: 11/21/17 Next ov: 05/29/18  Contract: can get at 05/29/18 visit UDS: can get at 05/29/18 visit

## 2018-05-05 ENCOUNTER — Other Ambulatory Visit: Payer: Self-pay | Admitting: Family Medicine

## 2018-05-05 DIAGNOSIS — I10 Essential (primary) hypertension: Secondary | ICD-10-CM

## 2018-05-27 ENCOUNTER — Other Ambulatory Visit: Payer: Self-pay | Admitting: Family Medicine

## 2018-05-27 DIAGNOSIS — I10 Essential (primary) hypertension: Secondary | ICD-10-CM

## 2018-05-29 ENCOUNTER — Ambulatory Visit: Payer: Medicare Other | Admitting: Family Medicine

## 2018-06-11 ENCOUNTER — Other Ambulatory Visit: Payer: Self-pay | Admitting: Family Medicine

## 2018-06-11 DIAGNOSIS — E785 Hyperlipidemia, unspecified: Secondary | ICD-10-CM

## 2018-06-23 ENCOUNTER — Other Ambulatory Visit: Payer: Self-pay | Admitting: Family Medicine

## 2018-06-23 DIAGNOSIS — I10 Essential (primary) hypertension: Secondary | ICD-10-CM

## 2018-06-26 ENCOUNTER — Ambulatory Visit: Payer: Medicare Other | Admitting: Family Medicine

## 2018-07-15 DIAGNOSIS — E119 Type 2 diabetes mellitus without complications: Secondary | ICD-10-CM | POA: Diagnosis not present

## 2018-07-15 DIAGNOSIS — H04123 Dry eye syndrome of bilateral lacrimal glands: Secondary | ICD-10-CM | POA: Diagnosis not present

## 2018-07-15 DIAGNOSIS — S0502XD Injury of conjunctiva and corneal abrasion without foreign body, left eye, subsequent encounter: Secondary | ICD-10-CM | POA: Diagnosis not present

## 2018-07-15 DIAGNOSIS — H524 Presbyopia: Secondary | ICD-10-CM | POA: Diagnosis not present

## 2018-07-15 DIAGNOSIS — Z961 Presence of intraocular lens: Secondary | ICD-10-CM | POA: Diagnosis not present

## 2018-07-28 ENCOUNTER — Other Ambulatory Visit: Payer: Self-pay | Admitting: Family Medicine

## 2018-07-28 DIAGNOSIS — I10 Essential (primary) hypertension: Secondary | ICD-10-CM

## 2018-07-28 DIAGNOSIS — F411 Generalized anxiety disorder: Secondary | ICD-10-CM

## 2018-07-28 NOTE — Telephone Encounter (Signed)
Requesting: Diazepam Contract: N/A UDS: N/A Last OV: 11/21/2017 Next OV: 08/05/2018 Last Refill: 03/27/2018, #[id number] RF Database:   Please advise

## 2018-08-05 ENCOUNTER — Ambulatory Visit: Payer: Medicare Other | Admitting: Family Medicine

## 2018-09-11 ENCOUNTER — Other Ambulatory Visit: Payer: Self-pay | Admitting: Family Medicine

## 2018-09-11 ENCOUNTER — Ambulatory Visit: Payer: Medicare Other | Admitting: Family Medicine

## 2018-09-11 DIAGNOSIS — E785 Hyperlipidemia, unspecified: Secondary | ICD-10-CM

## 2018-09-16 ENCOUNTER — Other Ambulatory Visit: Payer: Self-pay | Admitting: Family Medicine

## 2018-09-16 DIAGNOSIS — I10 Essential (primary) hypertension: Secondary | ICD-10-CM

## 2018-10-25 ENCOUNTER — Other Ambulatory Visit: Payer: Self-pay | Admitting: Family Medicine

## 2018-10-25 DIAGNOSIS — I10 Essential (primary) hypertension: Secondary | ICD-10-CM

## 2018-11-20 ENCOUNTER — Ambulatory Visit: Payer: Medicare Other | Admitting: Family Medicine

## 2018-11-24 ENCOUNTER — Encounter: Payer: Self-pay | Admitting: Family Medicine

## 2018-11-24 ENCOUNTER — Other Ambulatory Visit: Payer: Self-pay

## 2018-11-24 ENCOUNTER — Ambulatory Visit (INDEPENDENT_AMBULATORY_CARE_PROVIDER_SITE_OTHER): Payer: Medicare Other | Admitting: Family Medicine

## 2018-11-24 DIAGNOSIS — E785 Hyperlipidemia, unspecified: Secondary | ICD-10-CM | POA: Diagnosis not present

## 2018-11-24 DIAGNOSIS — I10 Essential (primary) hypertension: Secondary | ICD-10-CM | POA: Diagnosis not present

## 2018-11-24 DIAGNOSIS — F411 Generalized anxiety disorder: Secondary | ICD-10-CM | POA: Diagnosis not present

## 2018-11-24 MED ORDER — SIMVASTATIN 20 MG PO TABS: ORAL_TABLET | ORAL | 1 refills | Status: DC

## 2018-11-24 MED ORDER — METOPROLOL SUCCINATE ER 50 MG PO TB24: ORAL_TABLET | ORAL | 0 refills | Status: DC

## 2018-11-24 MED ORDER — LOSARTAN POTASSIUM 100 MG PO TABS: ORAL_TABLET | ORAL | 1 refills | Status: DC

## 2018-11-24 MED ORDER — POTASSIUM CHLORIDE CRYS ER 20 MEQ PO TBCR: EXTENDED_RELEASE_TABLET | ORAL | 3 refills | Status: DC

## 2018-11-24 MED ORDER — DIAZEPAM 5 MG PO TABS: 5.0000 mg | ORAL_TABLET | Freq: Every day | ORAL | 0 refills | Status: DC

## 2018-11-24 NOTE — Progress Notes (Signed)
Virtual Visit via Telephone Note  I connected with [name] on 11/24/18 at  1:15 PM EDT by telephone and verified that I am speaking with the correct person using two identifiers.  Location: Patient: home  Provider: office    I discussed the limitations, risks, security and privacy concerns of performing an evaluation and management service by telephone and the availability of in person appointments. I also discussed with the patient that there may be a patient responsible charge related to this service. The patient expressed understanding and agreed to proceed.   History of Present Illness: Pt is home and needs refills on her meds  Pt has no complaints     Observations/Objective: Pt states bp has been good-- she did not check it today No vitals obtained Pt in NAD No sob, no cp, no palp  Assessment and Plan: 1. Essential hypertension Well controlled, no changes to meds. Encouraged heart healthy diet such as the DASH diet and exercise as tolerated.  - losartan (COZAAR) 100 MG tablet; TAKE 1 TABLET(100 MG) BY MOUTH DAILY  Dispense: 90 tablet; Refill: 1 - metoprolol succinate (TOPROL-XL) 50 MG 24 hr tablet; TAKE 1 TABLET BY MOUTH EVERY WITH OR IMMEDIATELY FOLLOWING A MEAL  Dispense: 90 tablet; Refill: 0 - potassium chloride SA (K-DUR) 20 MEQ tablet; TAKE 1 TABLET(20 MEQ) BY MOUTH DAILY  Dispense: 90 tablet; Refill: 3  2. Hyperlipidemia, unspecified hyperlipidemia type Tolerating statin, encouraged heart healthy diet, avoid trans fats, minimize simple carbs and saturated fats. Increase exercise as tolerated - simvastatin (ZOCOR) 20 MG tablet; 1 po qd  Dispense: 90 tablet; Refill: 1  3. Generalized anxiety disorder Stable con't prn meds  - diazepam (VALIUM) 5 MG tablet; Take 1 tablet (5 mg total) by mouth daily.  Dispense: 45 tablet; Refill: 0   Follow Up Instructions:    I discussed the assessment and treatment plan with the patient. The patient was provided an  opportunity to ask questions and all were answered. The patient agreed with the plan and demonstrated an understanding of the instructions.   The patient was advised to call back or seek an in-person evaluation if the symptoms worsen or if the condition fails to improve as anticipated.  I provided 20 minutes of non-face-to-face time during this encounter.   Ann Held, DO

## 2018-12-18 ENCOUNTER — Other Ambulatory Visit: Payer: Self-pay

## 2018-12-18 ENCOUNTER — Other Ambulatory Visit: Payer: Self-pay | Admitting: Family Medicine

## 2018-12-18 ENCOUNTER — Other Ambulatory Visit (INDEPENDENT_AMBULATORY_CARE_PROVIDER_SITE_OTHER): Payer: Medicare Other

## 2018-12-18 DIAGNOSIS — E1165 Type 2 diabetes mellitus with hyperglycemia: Secondary | ICD-10-CM | POA: Diagnosis not present

## 2018-12-18 DIAGNOSIS — E041 Nontoxic single thyroid nodule: Secondary | ICD-10-CM

## 2018-12-18 DIAGNOSIS — E785 Hyperlipidemia, unspecified: Secondary | ICD-10-CM

## 2018-12-18 DIAGNOSIS — I10 Essential (primary) hypertension: Secondary | ICD-10-CM

## 2018-12-18 LAB — COMPREHENSIVE METABOLIC PANEL
ALT: 17 U/L (ref 0–35)
AST: 19 U/L (ref 0–37)
Albumin: 4.4 g/dL (ref 3.5–5.2)
Alkaline Phosphatase: 75 U/L (ref 39–117)
BUN: 20 mg/dL (ref 6–23)
CO2: 25 mEq/L (ref 19–32)
Calcium: 9.7 mg/dL (ref 8.4–10.5)
Chloride: 103 mEq/L (ref 96–112)
Creatinine, Ser: 1.04 mg/dL (ref 0.40–1.20)
GFR: 50.71 mL/min — ABNORMAL LOW (ref >60.00)
Glucose, Bld: 116 mg/dL — ABNORMAL HIGH (ref 70–99)
Potassium: 3.9 mEq/L (ref 3.5–5.1)
Sodium: 140 mEq/L (ref 135–145)
Total Bilirubin: 1.1 mg/dL (ref 0.2–1.2)
Total Protein: 6.6 g/dL (ref 6.0–8.3)

## 2018-12-18 LAB — TSH: TSH: 2.48 u[IU]/mL (ref 0.35–4.50)

## 2018-12-18 LAB — LIPID PANEL
Cholesterol: 146 mg/dL (ref 0–200)
HDL: 64.6 mg/dL (ref >39.00)
LDL Cholesterol: 67 mg/dL (ref 0–99)
NonHDL: 81.52
Total CHOL/HDL Ratio: 2
Triglycerides: 75 mg/dL (ref 0.0–149.0)
VLDL: 15 mg/dL (ref 0.0–40.0)

## 2018-12-18 LAB — HEMOGLOBIN A1C: Hgb A1c MFr Bld: 6.2 % (ref 4.6–6.5)

## 2018-12-22 ENCOUNTER — Other Ambulatory Visit: Payer: Self-pay | Admitting: Family Medicine

## 2018-12-22 DIAGNOSIS — E785 Hyperlipidemia, unspecified: Secondary | ICD-10-CM

## 2018-12-22 DIAGNOSIS — E119 Type 2 diabetes mellitus without complications: Secondary | ICD-10-CM

## 2018-12-22 DIAGNOSIS — E1169 Type 2 diabetes mellitus with other specified complication: Secondary | ICD-10-CM

## 2018-12-22 DIAGNOSIS — I10 Essential (primary) hypertension: Secondary | ICD-10-CM

## 2018-12-26 ENCOUNTER — Ambulatory Visit: Payer: Medicare Other | Admitting: Family Medicine

## 2019-01-26 ENCOUNTER — Other Ambulatory Visit: Payer: Self-pay | Admitting: *Deleted

## 2019-01-26 DIAGNOSIS — I10 Essential (primary) hypertension: Secondary | ICD-10-CM

## 2019-01-26 MED ORDER — TRIAMTERENE-HCTZ 75-50 MG PO TABS: 0.5000 | ORAL_TABLET | Freq: Every day | ORAL | 1 refills | Status: DC

## 2019-02-24 ENCOUNTER — Other Ambulatory Visit: Payer: Self-pay | Admitting: *Deleted

## 2019-02-24 DIAGNOSIS — I10 Essential (primary) hypertension: Secondary | ICD-10-CM

## 2019-02-24 MED ORDER — LOSARTAN POTASSIUM 100 MG PO TABS: ORAL_TABLET | ORAL | 1 refills | Status: DC

## 2019-03-02 ENCOUNTER — Other Ambulatory Visit: Payer: Self-pay | Admitting: *Deleted

## 2019-03-02 DIAGNOSIS — I10 Essential (primary) hypertension: Secondary | ICD-10-CM

## 2019-03-02 MED ORDER — METOPROLOL SUCCINATE ER 50 MG PO TB24: ORAL_TABLET | ORAL | 1 refills | Status: DC

## 2019-03-24 ENCOUNTER — Telehealth: Payer: Self-pay | Admitting: *Deleted

## 2019-03-24 ENCOUNTER — Other Ambulatory Visit: Payer: Self-pay | Admitting: Family Medicine

## 2019-03-24 DIAGNOSIS — F411 Generalized anxiety disorder: Secondary | ICD-10-CM

## 2019-03-24 MED ORDER — DIAZEPAM 5 MG PO TABS: 5.0000 mg | ORAL_TABLET | Freq: Every day | ORAL | 0 refills | Status: DC

## 2019-03-24 NOTE — Telephone Encounter (Signed)
Sent in

## 2019-03-24 NOTE — Telephone Encounter (Signed)
[facility] request refill for diazepam  Last written: 11/24/18 Last ov: 11/24/18 Next ov: 05/29/19 Contract: will get at next visit UDS: will get at next visit

## 2019-04-01 ENCOUNTER — Other Ambulatory Visit: Payer: Self-pay

## 2019-05-29 ENCOUNTER — Ambulatory Visit: Payer: Medicare Other | Admitting: Family Medicine

## 2019-06-01 ENCOUNTER — Other Ambulatory Visit: Payer: Self-pay | Admitting: *Deleted

## 2019-06-01 DIAGNOSIS — E785 Hyperlipidemia, unspecified: Secondary | ICD-10-CM

## 2019-06-01 MED ORDER — SIMVASTATIN 20 MG PO TABS: ORAL_TABLET | ORAL | 1 refills | Status: DC

## 2019-06-26 ENCOUNTER — Ambulatory Visit: Payer: Medicare Other | Admitting: Family Medicine

## 2019-07-23 ENCOUNTER — Other Ambulatory Visit: Payer: Self-pay

## 2019-07-24 ENCOUNTER — Ambulatory Visit (INDEPENDENT_AMBULATORY_CARE_PROVIDER_SITE_OTHER): Payer: Medicare Other | Admitting: Family Medicine

## 2019-07-24 ENCOUNTER — Other Ambulatory Visit: Payer: Self-pay

## 2019-07-24 ENCOUNTER — Encounter: Payer: Self-pay | Admitting: Family Medicine

## 2019-07-24 VITALS — BP 144/100 | HR 78 | Temp 97.8°F | Resp 18 | Ht 63.0 in | Wt 147.2 lb

## 2019-07-24 DIAGNOSIS — E1165 Type 2 diabetes mellitus with hyperglycemia: Secondary | ICD-10-CM | POA: Diagnosis not present

## 2019-07-24 DIAGNOSIS — E118 Type 2 diabetes mellitus with unspecified complications: Secondary | ICD-10-CM | POA: Diagnosis not present

## 2019-07-24 DIAGNOSIS — E1169 Type 2 diabetes mellitus with other specified complication: Secondary | ICD-10-CM

## 2019-07-24 DIAGNOSIS — E785 Hyperlipidemia, unspecified: Secondary | ICD-10-CM

## 2019-07-24 DIAGNOSIS — I10 Essential (primary) hypertension: Secondary | ICD-10-CM | POA: Diagnosis not present

## 2019-07-24 DIAGNOSIS — F411 Generalized anxiety disorder: Secondary | ICD-10-CM

## 2019-07-24 LAB — LIPID PANEL
Cholesterol: 160 mg/dL (ref 0–200)
HDL: 69 mg/dL (ref >39.00)
LDL Cholesterol: 76 mg/dL (ref 0–99)
NonHDL: 91.39
Total CHOL/HDL Ratio: 2
Triglycerides: 77 mg/dL (ref 0.0–149.0)
VLDL: 15.4 mg/dL (ref 0.0–40.0)

## 2019-07-24 LAB — COMPREHENSIVE METABOLIC PANEL
ALT: 17 U/L (ref 0–35)
AST: 18 U/L (ref 0–37)
Albumin: 4.4 g/dL (ref 3.5–5.2)
Alkaline Phosphatase: 91 U/L (ref 39–117)
BUN: 27 mg/dL — ABNORMAL HIGH (ref 6–23)
CO2: 26 mEq/L (ref 19–32)
Calcium: 10 mg/dL (ref 8.4–10.5)
Chloride: 101 mEq/L (ref 96–112)
Creatinine, Ser: 1.11 mg/dL (ref 0.40–1.20)
GFR: 46.96 mL/min — ABNORMAL LOW (ref >60.00)
Glucose, Bld: 114 mg/dL — ABNORMAL HIGH (ref 70–99)
Potassium: 4 mEq/L (ref 3.5–5.1)
Sodium: 138 mEq/L (ref 135–145)
Total Bilirubin: 1.1 mg/dL (ref 0.2–1.2)
Total Protein: 7.2 g/dL (ref 6.0–8.3)

## 2019-07-24 LAB — MICROALBUMIN / CREATININE URINE RATIO
Creatinine,U: 53.6 mg/dL
Microalb Creat Ratio: 1.3 mg/g (ref 0.0–30.0)
Microalb, Ur: <0.7 mg/dL (ref 0.0–1.9)

## 2019-07-24 LAB — HEMOGLOBIN A1C: Hgb A1c MFr Bld: 6.2 % (ref 4.6–6.5)

## 2019-07-24 MED ORDER — DIAZEPAM 5 MG PO TABS: 5.0000 mg | ORAL_TABLET | Freq: Every day | ORAL | 0 refills | Status: DC

## 2019-07-24 MED ORDER — METOPROLOL SUCCINATE ER 50 MG PO TB24: ORAL_TABLET | ORAL | 1 refills | Status: DC

## 2019-07-24 MED ORDER — POTASSIUM CHLORIDE CRYS ER 20 MEQ PO TBCR: EXTENDED_RELEASE_TABLET | ORAL | 3 refills | Status: DC

## 2019-07-24 MED ORDER — METOPROLOL SUCCINATE ER 100 MG PO TB24: 100.0000 mg | ORAL_TABLET | Freq: Every day | ORAL | 3 refills | Status: DC

## 2019-07-24 MED ORDER — SIMVASTATIN 20 MG PO TABS: ORAL_TABLET | ORAL | 1 refills | Status: DC

## 2019-07-24 MED ORDER — LOSARTAN POTASSIUM 100 MG PO TABS: ORAL_TABLET | ORAL | 1 refills | Status: DC

## 2019-07-24 MED ORDER — TRIAMTERENE-HCTZ 75-50 MG PO TABS: 0.5000 | ORAL_TABLET | Freq: Every day | ORAL | 1 refills | Status: DC

## 2019-07-24 NOTE — Patient Instructions (Signed)
Carbohydrate Counting for Diabetes Mellitus, Adult  Carbohydrate counting is a method of keeping track of how many carbohydrates you eat. Eating carbohydrates naturally increases the amount of sugar (glucose) in the blood. Counting how many carbohydrates you eat helps keep your blood glucose within normal limits, which helps you manage your diabetes (diabetes mellitus). It is important to know how many carbohydrates you can safely have in each meal. This is different for every person. A diet and nutrition specialist (registered dietitian) can help you make a meal plan and calculate how many carbohydrates you should have at each meal and snack. Carbohydrates are found in the following foods:  Grains, such as breads and cereals.  Dried beans and soy products.  Starchy vegetables, such as potatoes, peas, and corn.  Fruit and fruit juices.  Milk and yogurt.  Sweets and snack foods, such as cake, cookies, candy, chips, and soft drinks. How do I count carbohydrates? There are two ways to count carbohydrates in food. You can use either of the methods or a combination of both. Reading "Nutrition Facts" on packaged food The "Nutrition Facts" list is included on the labels of almost all packaged foods and beverages in the [place] It includes:  The serving size.  Information about nutrients in each serving, including the grams (g) of carbohydrate per serving. To use the "Nutrition Facts":  Decide how many servings you will have.  Multiply the number of servings by the number of carbohydrates per serving.  The resulting number is the total amount of carbohydrates that you will be having. Learning standard serving sizes of other foods When you eat carbohydrate foods that are not packaged or do not include "Nutrition Facts" on the label, you need to measure the servings in order to count the amount of carbohydrates:  Measure the foods that you will eat with a food scale or measuring cup, if  needed.  Decide how many standard-size servings you will eat.  Multiply the number of servings by 15. Most carbohydrate-rich foods have about 15 g of carbohydrates per serving. ? For example, if you eat 8 oz (170 g) of strawberries, you will have eaten 2 servings and 30 g of carbohydrates (2 servings x 15 g = 30 g).  For foods that have more than one food mixed, such as soups and casseroles, you must count the carbohydrates in each food that is included. The following list contains standard serving sizes of common carbohydrate-rich foods. Each of these servings has about 15 g of carbohydrates:   hamburger bun or  English muffin.   oz (15 mL) syrup.   oz (14 g) jelly.  1 slice of bread.  1 six-inch tortilla.  3 oz (85 g) cooked rice or pasta.  4 oz (113 g) cooked dried beans.  4 oz (113 g) starchy vegetable, such as peas, corn, or potatoes.  4 oz (113 g) hot cereal.  4 oz (113 g) mashed potatoes or  of a large baked potato.  4 oz (113 g) canned or frozen fruit.  4 oz (120 mL) fruit juice.  4-6 crackers.  6 chicken nuggets.  6 oz (170 g) unsweetened dry cereal.  6 oz (170 g) plain fat-free yogurt or yogurt sweetened with artificial sweeteners.  8 oz (240 mL) milk.  8 oz (170 g) fresh fruit or one small piece of fruit.  24 oz (680 g) popped popcorn. Example of carbohydrate counting Sample meal  3 oz (85 g) chicken breast.  6 oz (170 g)   brown rice.  4 oz (113 g) corn.  8 oz (240 mL) milk.  8 oz (170 g) strawberries with sugar-free whipped topping. Carbohydrate calculation 1. Identify the foods that contain carbohydrates: ? Rice. ? Corn. ? Milk. ? Strawberries. 2. Calculate how many servings you have of each food: ? 2 servings rice. ? 1 serving corn. ? 1 serving milk. ? 1 serving strawberries. 3. Multiply each number of servings by 15 g: ? 2 servings rice x 15 g = 30 g. ? 1 serving corn x 15 g = 15 g. ? 1 serving milk x 15 g = 15 g. ? 1  serving strawberries x 15 g = 15 g. 4. Add together all of the amounts to find the total grams of carbohydrates eaten: ? 30 g + 15 g + 15 g + 15 g = 75 g of carbohydrates total. Summary  Carbohydrate counting is a method of keeping track of how many carbohydrates you eat.  Eating carbohydrates naturally increases the amount of sugar (glucose) in the blood.  Counting how many carbohydrates you eat helps keep your blood glucose within normal limits, which helps you manage your diabetes.  A diet and nutrition specialist (registered dietitian) can help you make a meal plan and calculate how many carbohydrates you should have at each meal and snack. This information is not intended to replace advice given to you by your health care provider. Make sure you discuss any questions you have with your health care provider. Document Revised: 11/15/2016 Document Reviewed: 10/05/2015 Elsevier Patient Education  2020 [employer]  

## 2019-07-24 NOTE — Assessment & Plan Note (Signed)
Check labs  con't ,meds

## 2019-07-24 NOTE — Assessment & Plan Note (Signed)
Well controlled, no changes to meds. Encouraged heart healthy diet such as the DASH diet and exercise as tolerated.  °

## 2019-07-24 NOTE — Progress Notes (Signed)
Patient ID: [name], female    DOB: June 13, 1934  Age: 85 y.o. MRN: [id number]    Subjective:  Subjective  HPI [name] presents for dm ii , htn and cholesterol.  HPI HYPERTENSION   Blood pressure range-not checking   Chest pain- no      Dyspnea- no Lightheadedness- no   Edema- no  Other side effects - no   Medication compliance: good Low salt diet- yes    DIABETES    Blood Sugar ranges-not checking   Polyuria- no New Visual problems- no  Hypoglycemic symptoms- no  Other side effects-no Medication compliance - good Last eye exam- due Foot exam- today   HYPERLIPIDEMIA  Medication compliance- good RUQ pain- no  Muscle aches- no Other side effects-no   Review of Systems  Constitutional: Negative for chills and fever.  HENT: Negative for congestion and hearing loss.   Eyes: Negative for discharge.  Respiratory: Negative for cough and shortness of breath.   Cardiovascular: Negative for chest pain, palpitations and leg swelling.  Gastrointestinal: Negative for abdominal pain, blood in stool, constipation, diarrhea, nausea and vomiting.  Genitourinary: Negative for dysuria, frequency, hematuria and urgency.  Musculoskeletal: Negative for back pain and myalgias.  Skin: Negative for rash.  Allergic/Immunologic: Negative for environmental allergies.  Neurological: Negative for dizziness, weakness and headaches.  Hematological: Does not bruise/bleed easily.  Psychiatric/Behavioral: Negative for suicidal ideas. The patient is not nervous/anxious.     History Past Medical History:  Diagnosis Date  . Abscess, rectum   . Diabetes mellitus    type 2  . Fatty liver disease, nonalcoholic   . Hyperlipidemia   . Hypertension   . Thyroid nodule     She has a past surgical history that includes Abdominal hysterectomy; Anal fissure repair; Tonsillectomy and adenoidectomy; Hemorrhoid surgery; Cataract extraction, bilateral (Feb); and Eye surgery  (06/2013).   Her family history includes Diabetes in her brother, mother, and unknown relative; Hypertension in her brother, mother, sister, and unknown relative; Mental retardation in her sister; Other in her brother.She reports that she has never smoked. She has never used smokeless tobacco. She reports that she does not drink alcohol or use drugs.  Current Outpatient Medications on File Prior to Visit  Medication Sig Dispense Refill  . aspirin 81 MG tablet Take 81 mg by mouth daily.      . Calcium-Vitamin D-Vitamin K (VIACTIV) 500-500-40 MG-UNT-MCG CHEW Chew 2 each by mouth daily.    . fluticasone (FLONASE) 50 MCG/ACT nasal spray SHAKE LIQUID AND USE 2 SPRAYS IN EACH NOSTRIL DAILY 16 g 5   No current facility-administered medications on file prior to visit.     Objective:  Objective  Physical Exam Vitals and nursing note reviewed.  Constitutional:      Appearance: She is well-developed.  HENT:     Head: Normocephalic and atraumatic.  Eyes:     Conjunctiva/sclera: Conjunctivae normal.  Neck:     Thyroid: No thyromegaly.     Vascular: No carotid bruit or JVD.  Cardiovascular:     Rate and Rhythm: Normal rate and regular rhythm.     Heart sounds: Normal heart sounds. No murmur.  Pulmonary:     Effort: Pulmonary effort is normal. No respiratory distress.     Breath sounds: Normal breath sounds. No wheezing or rales.  Chest:     Chest wall: No tenderness.  Musculoskeletal:     Cervical back: Normal range of motion and  neck supple.  Neurological:     Mental Status: She is alert and oriented to person, place, and time.    Diabetic Foot Exam - Simple   Simple Foot Form Diabetic Foot exam was performed with the following findings: Yes 07/24/2019 10:39 AM  Visual Inspection No deformities, no ulcerations, no other skin breakdown bilaterally: Yes Sensation Testing Intact to touch and monofilament testing bilaterally: Yes Pulse Check Posterior Tibialis and Dorsalis pulse intact  bilaterally: Yes Comments     BP (!) 144/100 (BP Location: Left Arm, Patient Position: Sitting, Cuff Size: Normal)   Pulse 78   Temp 97.8 F (36.6 C) (Temporal)   Resp 18   Ht 5\' 3"  (1.6 m)   Wt 147 lb 3.2 oz (66.8 kg)   SpO2 98%   BMI 26.08 kg/m  Wt Readings from Last 3 Encounters:  07/24/19 147 lb 3.2 oz (66.8 kg)  11/21/17 147 lb 6.4 oz (66.9 kg)  03/12/17 145 lb 12.8 oz (66.1 kg)     Lab Results  Component Value Date   WBC 5.9 06/21/2016   HGB 15.6 (H) 06/21/2016   HCT 46.3 (H) 06/21/2016   PLT 241.0 06/21/2016   GLUCOSE 116 (H) 12/18/2018   CHOL 146 12/18/2018   TRIG 75.0 12/18/2018   HDL 64.60 12/18/2018   LDLCALC 67 12/18/2018   ALT 17 12/18/2018   AST 19 12/18/2018   NA 140 12/18/2018   K 3.9 12/18/2018   CL 103 12/18/2018   CREATININE 1.04 12/18/2018   BUN 20 12/18/2018   CO2 25 12/18/2018   TSH 2.48 12/18/2018   INR 1.04 06/02/2010   HGBA1C 6.2 12/18/2018   MICROALBUR <0.7 01/27/2015    DG Bone Density  Result Date: 03/07/2015 EXAM: DUAL X-RAY ABSORPTIOMETRY (DXA) FOR BONE MINERAL DENSITY IMPRESSION: Referring Physician:  03/09/2015 PATIENT: Name: Chioma, Mukherjee Patient ID: Shawnie Pons Birth Date: 10-Apr-1935 Height: 62.0 in. Sex: Female Measured: 03/07/2015 Weight: 145.0 lbs. Indications: Advanced Age, Caucasian, Post Menopausal, Secondary Osteoporosis Fractures: Wrist Treatments: ASSESSMENT: The BMD measured at Femur Neck Right is 0.866 g/cm2 with a T-score of -1.2. This [name] according to World Health Organization [facility]) criteria. Site Region Measured Date Measured Age WHO YA BMD Classification T-score AP Spine L1-L4 5/07/2015 [age] Normal 0.0 1.181 g/cm2 DualFemur Neck Right 5/07/2015 78.6 years Osteopenia -1.2 0.866 g/cm2 World Health Organization [facility]) criteria for post-menopausal, Caucasian Women: Normal       T-score at or above -1 SD Osteopenia   T-score between -1 and -2.5 SD Osteoporosis T-score at or below -2.5 SD  RECOMMENDATION: National Osteoporosis Foundation recommends that FDA-approved medical therapies be considered in postmenopausal women and men age 71 or older with a: 1. Hip or vertebral (clinical or morphometric) fracture. 2. T-score of < -2.5 at the spine or hip. 3. Ten-year fracture probability by FRAX of 3% or greater for hip fracture or 20% or greater for major osteoporotic fracture. All treatment decisions require clinical judgment and consideration of individual patient factors, including patient preferences, co-morbidities, previous drug use, risk factors not captured in the FRAX model (e.g. falls, vitamin D deficiency, increased bone turnover, interval significant decline in bone density) and possible under - or over-estimation of fracture risk by FRAX. All patients should ensure an adequate intake of dietary calcium (1200 mg/d) and vitamin D (800 IU daily) unless contraindicated. FOLLOW-UP: People with diagnosed cases of osteoporosis or at high risk for fracture should have regular bone mineral density tests. For patients eligible for  Medicare, routine testing is allowed once every 2 years. The testing frequency can be increased to one year for patients who have rapidly progressing disease, those who are receiving or discontinuing medical therapy to restore bone mass, or have additional risk factors. I have reviewed this report and agree with the above findings. Mark A. Tyron Russell, M.D. [facility] Radiology Patient: Aubriel, Khanna Referring Physician: Lelon Perla Birth Date: 12-Mar-1935 [age] Patient ID: [id number] Height: 62.0 in. Weight: 145.0 lbs. Measured: 03/07/2015 10:05:47 AM (16 SP 2) Sex: Female Ethnicity: White Analyzed: 03/07/2015 10:10:38 AM (16 SP 2) FRAX* 10-year Probability of Fracture Based on femoral neck BMD: DualFemur (Right) Major Osteoporotic Fracture: 12.0% Hip Fracture:                2.4% Population:                  [place] (Caucasian) Risk Factors:                 Secondary Osteoporosis ASSESSMENT: The probability of a major osteoporotic fracture is 12.0% within the next ten years. The probability of a hip fracture is 2.4% within the next ten years. *FRAX is a Armed forces logistics/support/administrative officer of the [employer] of [employer] for Metabolic Bone Disease, a World Science writer (WHO) [employer]. I have reviewed this report and agree with the above findings. Mark A. Tyron Russell, M.D. [facility] Radiology Electronically Signed   By: Ulyses Southward M.D.   On: 03/07/2015 10:30     Assessment & Plan:  Plan  I have discontinued Angelyse Heslin. Gose's metoprolol succinate and metoprolol succinate. I have also changed her potassium chloride SA. Additionally, I am having her start on metoprolol succinate. Lastly, I am having her maintain her aspirin, Calcium-Vitamin D-Vitamin K, fluticasone, diazepam, losartan, simvastatin, and triamterene-hydrochlorothiazide.  Meds ordered this encounter  Medications  . diazepam (VALIUM) 5 MG tablet    Sig: Take 1 tablet (5 mg total) by mouth daily.    Dispense:  45 tablet    Refill:  0  . losartan (COZAAR) 100 MG tablet    Sig: TAKE 1 TABLET(100 MG) BY MOUTH DAILY    Dispense:  90 tablet    Refill:  1  . DISCONTD: metoprolol succinate (TOPROL-XL) 50 MG 24 hr tablet    Sig: TAKE 1 TABLET BY MOUTH EVERY WITH OR IMMEDIATELY FOLLOWING A MEAL    Dispense:  90 tablet    Refill:  1  . potassium chloride SA (KLOR-CON) 20 MEQ tablet    Sig: TAKE 1 TABLET(20 MEQ) BY MOUTH DAILY    Dispense:  90 tablet    Refill:  3  . simvastatin (ZOCOR) 20 MG tablet    Sig: 1 po qd    Dispense:  90 tablet    Refill:  1  . triamterene-hydrochlorothiazide (MAXZIDE) 75-50 MG tablet    Sig: Take 0.5 tablets by mouth daily.    Dispense:  45 tablet    Refill:  1  . metoprolol succinate (TOPROL-XL) 100 MG 24 hr tablet    Sig: Take 1 tablet (100 mg total) by mouth daily. Take with or immediately following a meal.    Dispense:  90 tablet     Refill:  3    Problem List Items Addressed This Visit      Unprioritized   Essential hypertension    Well controlled, no changes to meds. Encouraged heart healthy diet such as the DASH diet  and exercise as tolerated.       Relevant Medications   losartan (COZAAR) 100 MG tablet   potassium chloride SA (KLOR-CON) 20 MEQ tablet   simvastatin (ZOCOR) 20 MG tablet   triamterene-hydrochlorothiazide (MAXZIDE) 75-50 MG tablet   metoprolol succinate (TOPROL-XL) 100 MG 24 hr tablet   Other Relevant Orders   Lipid panel   Hemoglobin A1c   Comprehensive metabolic panel   Microalbumin / creatinine urine ratio   Hyperlipidemia   Relevant Medications   losartan (COZAAR) 100 MG tablet   simvastatin (ZOCOR) 20 MG tablet   triamterene-hydrochlorothiazide (MAXZIDE) 75-50 MG tablet   metoprolol succinate (TOPROL-XL) 100 MG 24 hr tablet   Other Relevant Orders   Lipid panel   Hemoglobin A1c   Comprehensive metabolic panel   Microalbumin / creatinine urine ratio   Hyperlipidemia associated with type 2 diabetes mellitus (HCC)    Tolerating statin, encouraged heart healthy diet, avoid trans fats, minimize simple carbs and saturated fats. Increase exercise as tolerated      Relevant Medications   losartan (COZAAR) 100 MG tablet   simvastatin (ZOCOR) 20 MG tablet   Type 2 diabetes mellitus with complication, without long-term current use of insulin (HCC)    Check labs  con't ,meds       Relevant Medications   losartan (COZAAR) 100 MG tablet   simvastatin (ZOCOR) 20 MG tablet    Other Visit Diagnoses    Uncontrolled type 2 diabetes mellitus with hyperglycemia (HCC)    -  Primary   Relevant Medications   losartan (COZAAR) 100 MG tablet   simvastatin (ZOCOR) 20 MG tablet   Other Relevant Orders   Hemoglobin A1c   Comprehensive metabolic panel   Microalbumin / creatinine urine ratio   Generalized anxiety disorder       Relevant Medications   diazepam (VALIUM) 5 MG tablet       Follow-up: Return in about 3 weeks (around 08/14/2019), or if symptoms worsen or fail to improve, for hypertension.  Ann Held, DO

## 2019-07-24 NOTE — Assessment & Plan Note (Signed)
Tolerating statin, encouraged heart healthy diet, avoid trans fats, minimize simple carbs and saturated fats. Increase exercise as tolerated 

## 2019-09-04 ENCOUNTER — Ambulatory Visit: Payer: Medicare Other

## 2019-09-15 ENCOUNTER — Ambulatory Visit (INDEPENDENT_AMBULATORY_CARE_PROVIDER_SITE_OTHER): Payer: Medicare Other | Admitting: Family Medicine

## 2019-09-15 ENCOUNTER — Other Ambulatory Visit: Payer: Self-pay

## 2019-09-15 VITALS — BP 162/90 | HR 61 | Temp 97.3°F | Resp 16 | Ht 63.0 in | Wt 144.4 lb

## 2019-09-15 DIAGNOSIS — I10 Essential (primary) hypertension: Secondary | ICD-10-CM

## 2019-09-15 MED ORDER — OLMESARTAN MEDOXOMIL 20 MG PO TABS: 20.0000 mg | ORAL_TABLET | Freq: Every day | ORAL | 2 refills | Status: DC

## 2019-09-15 NOTE — Patient Instructions (Signed)
DASH Eating Plan DASH stands for "Dietary Approaches to Stop Hypertension." The DASH eating plan is a healthy eating plan that has been shown to reduce high blood pressure (hypertension). It may also reduce your risk for type 2 diabetes, heart disease, and stroke. The DASH eating plan may also help with weight loss. What are tips for following this plan?  General guidelines  Avoid eating more than 2,300 mg (milligrams) of salt (sodium) a day. If you have hypertension, you may need to reduce your sodium intake to 1,500 mg a day.  Limit alcohol intake to no more than 1 drink a day for nonpregnant women and 2 drinks a day for men. One drink equals 12 oz of beer, 5 oz of wine, or 1 oz of hard liquor.  Work with your health care provider to maintain a healthy body weight or to lose weight. Ask what an ideal weight is for you.  Get at least 30 minutes of exercise that causes your heart to beat faster (aerobic exercise) most days of the week. Activities may include walking, swimming, or biking.  Work with your health care provider or diet and nutrition specialist (dietitian) to adjust your eating plan to your individual calorie needs. Reading food labels   Check food labels for the amount of sodium per serving. Choose foods with less than 5 percent of the Daily Value of sodium. Generally, foods with less than 300 mg of sodium per serving fit into this eating plan.  To find whole grains, look for the word "whole" as the first word in the ingredient list. Shopping  Buy products labeled as "low-sodium" or "no salt added."  Buy fresh foods. Avoid canned foods and premade or frozen meals. Cooking  Avoid adding salt when cooking. Use salt-free seasonings or herbs instead of table salt or sea salt. Check with your health care provider or pharmacist before using salt substitutes.  Do not fry foods. Cook foods using healthy methods such as baking, boiling, grilling, and broiling instead.  Cook with  heart-healthy oils, such as olive, canola, soybean, or sunflower oil. Meal planning  Eat a balanced diet that includes: ? 5 or more servings of fruits and vegetables each day. At each meal, try to fill half of your plate with fruits and vegetables. ? Up to 6-8 servings of whole grains each day. ? Less than 6 oz of lean meat, poultry, or fish each day. A 3-oz serving of meat is about the same size as a deck of cards. One egg equals 1 oz. ? 2 servings of low-fat dairy each day. ? A serving of nuts, seeds, or beans 5 times each week. ? Heart-healthy fats. Healthy fats called Omega-3 fatty acids are found in foods such as flaxseeds and coldwater fish, like sardines, salmon, and mackerel.  Limit how much you eat of the following: ? Canned or prepackaged foods. ? Food that is high in trans fat, such as fried foods. ? Food that is high in saturated fat, such as fatty meat. ? Sweets, desserts, sugary drinks, and other foods with added sugar. ? Full-fat dairy products.  Do not salt foods before eating.  Try to eat at least 2 vegetarian meals each week.  Eat more home-cooked food and less restaurant, buffet, and fast food.  When eating at a restaurant, ask that your food be prepared with less salt or no salt, if possible. What foods are recommended? The items listed may not be a complete list. Talk with your dietitian about   what dietary choices are best for you. Grains Whole-grain or whole-wheat bread. Whole-grain or whole-wheat pasta. Brown rice. Oatmeal. Quinoa. Bulgur. Whole-grain and low-sodium cereals. Pita bread. Low-fat, low-sodium crackers. Whole-wheat flour tortillas. Vegetables Fresh or frozen vegetables (raw, steamed, roasted, or grilled). Low-sodium or reduced-sodium tomato and vegetable juice. Low-sodium or reduced-sodium tomato sauce and tomato paste. Low-sodium or reduced-sodium canned vegetables. Fruits All fresh, dried, or frozen fruit. Canned fruit in natural juice (without  added sugar). Meat and other protein foods Skinless chicken or turkey. Ground chicken or turkey. Pork with fat trimmed off. Fish and seafood. Egg whites. Dried beans, peas, or lentils. Unsalted nuts, nut butters, and seeds. Unsalted canned beans. Lean cuts of beef with fat trimmed off. Low-sodium, lean deli meat. Dairy Low-fat (1%) or fat-free (skim) milk. Fat-free, low-fat, or reduced-fat cheeses. Nonfat, low-sodium ricotta or cottage cheese. Low-fat or nonfat yogurt. Low-fat, low-sodium cheese. Fats and oils Soft margarine without trans fats. Vegetable oil. Low-fat, reduced-fat, or light mayonnaise and salad dressings (reduced-sodium). Canola, safflower, olive, soybean, and sunflower oils. Avocado. Seasoning and other foods Herbs. Spices. Seasoning mixes without salt. Unsalted popcorn and pretzels. Fat-free sweets. What foods are not recommended? The items listed may not be a complete list. Talk with your dietitian about what dietary choices are best for you. Grains Baked goods made with fat, such as croissants, muffins, or some breads. Dry pasta or rice meal packs. Vegetables Creamed or fried vegetables. Vegetables in a cheese sauce. Regular canned vegetables (not low-sodium or reduced-sodium). Regular canned tomato sauce and paste (not low-sodium or reduced-sodium). Regular tomato and vegetable juice (not low-sodium or reduced-sodium). Pickles. Olives. Fruits Canned fruit in a light or heavy syrup. Fried fruit. Fruit in cream or butter sauce. Meat and other protein foods Fatty cuts of meat. Ribs. Fried meat. Bacon. Sausage. Bologna and other processed lunch meats. Salami. Fatback. Hotdogs. Bratwurst. Salted nuts and seeds. Canned beans with added salt. Canned or smoked fish. Whole eggs or egg yolks. Chicken or turkey with skin. Dairy Whole or 2% milk, cream, and half-and-half. Whole or full-fat cream cheese. Whole-fat or sweetened yogurt. Full-fat cheese. Nondairy creamers. Whipped toppings.  Processed cheese and cheese spreads. Fats and oils Butter. Stick margarine. Lard. Shortening. Ghee. Bacon fat. Tropical oils, such as coconut, palm kernel, or palm oil. Seasoning and other foods Salted popcorn and pretzels. Onion salt, garlic salt, seasoned salt, table salt, and sea salt. Worcestershire sauce. Tartar sauce. Barbecue sauce. Teriyaki sauce. Soy sauce, including reduced-sodium. Steak sauce. Canned and packaged gravies. Fish sauce. Oyster sauce. Cocktail sauce. Horseradish that you find on the shelf. Ketchup. Mustard. Meat flavorings and tenderizers. Bouillon cubes. Hot sauce and Tabasco sauce. Premade or packaged marinades. Premade or packaged taco seasonings. Relishes. Regular salad dressings. Where to find more information:  National Heart, Lung, and Blood Institute: www.nhlbi.nih.gov  American Heart Association: www.heart.org Summary  The DASH eating plan is a healthy eating plan that has been shown to reduce high blood pressure (hypertension). It may also reduce your risk for type 2 diabetes, heart disease, and stroke.  With the DASH eating plan, you should limit salt (sodium) intake to 2,300 mg a day. If you have hypertension, you may need to reduce your sodium intake to 1,500 mg a day.  When on the DASH eating plan, aim to eat more fresh fruits and vegetables, whole grains, lean proteins, low-fat dairy, and heart-healthy fats.  Work with your health care provider or diet and nutrition specialist (dietitian) to adjust your eating plan to your   individual calorie needs. This information is not intended to replace advice given to you by your health care provider. Make sure you discuss any questions you have with your health care provider. Document Revised: 04/05/2017 Document Reviewed: 04/16/2016 Elsevier Patient Education  2020 [employer]  

## 2019-09-15 NOTE — Progress Notes (Signed)
Patient ID: [name], female    DOB: [birth year]  Age: 85 y.o. MRN: [id number]    Subjective:  Subjective  HPI [name] presents for f/u htn      Pt has no complaints   Review of Systems  Constitutional: Negative for appetite change, diaphoresis, fatigue and unexpected weight change.  Eyes: Negative for pain, redness and visual disturbance.  Respiratory: Negative for cough, chest tightness, shortness of breath and wheezing.   Cardiovascular: Negative for chest pain, palpitations and leg swelling.  Endocrine: Negative for cold intolerance, heat intolerance, polydipsia, polyphagia and polyuria.  Genitourinary: Negative for difficulty urinating, dysuria and frequency.  Neurological: Negative for dizziness, light-headedness, numbness and headaches.    History Past Medical History:  Diagnosis Date  . Abscess, rectum   . Diabetes mellitus    type 2  . Fatty liver disease, nonalcoholic   . Hyperlipidemia   . Hypertension   . Thyroid nodule     She has a past surgical history that includes Abdominal hysterectomy; Anal fissure repair; Tonsillectomy and adenoidectomy; Hemorrhoid surgery; Cataract extraction, bilateral (Feb); and Eye surgery (06/2013).   Her family history includes Diabetes in her brother, mother, and unknown relative; Hypertension in her brother, mother, sister, and unknown relative; Mental retardation in her sister; Other in her brother.She reports that she has never smoked. She has never used smokeless tobacco. She reports that she does not drink alcohol or use drugs.  Current Outpatient Medications on File Prior to Visit  Medication Sig Dispense Refill  . aspirin 81 MG tablet Take 81 mg by mouth daily.      . Calcium-Vitamin D-Vitamin K (VIACTIV) 500-500-40 MG-UNT-MCG CHEW Chew 2 each by mouth daily.    . diazepam (VALIUM) 5 MG tablet Take 1 tablet (5 mg total) by mouth daily. 45 tablet 0  . fluticasone (FLONASE) 50 MCG/ACT nasal spray SHAKE LIQUID  AND USE 2 SPRAYS IN EACH NOSTRIL DAILY 16 g 5  . metoprolol succinate (TOPROL-XL) 100 MG 24 hr tablet Take 1 tablet (100 mg total) by mouth daily. Take with or immediately following a meal. 90 tablet 3  . potassium chloride SA (KLOR-CON) 20 MEQ tablet TAKE 1 TABLET(20 MEQ) BY MOUTH DAILY 90 tablet 3  . simvastatin (ZOCOR) 20 MG tablet 1 po qd 90 tablet 1  . triamterene-hydrochlorothiazide (MAXZIDE) 75-50 MG tablet Take 0.5 tablets by mouth daily. 45 tablet 1   No current facility-administered medications on file prior to visit.     Objective:  Objective  Physical Exam Vitals and nursing note reviewed.  Constitutional:      Appearance: She is well-developed.  HENT:     Head: Normocephalic and atraumatic.  Eyes:     Conjunctiva/sclera: Conjunctivae normal.  Neck:     Thyroid: No thyromegaly.     Vascular: No carotid bruit or JVD.  Cardiovascular:     Rate and Rhythm: Normal rate and regular rhythm.     Heart sounds: Normal heart sounds. No murmur.  Pulmonary:     Effort: Pulmonary effort is normal. No respiratory distress.     Breath sounds: Normal breath sounds. No wheezing or rales.  Chest:     Chest wall: No tenderness.  Musculoskeletal:     Cervical back: Normal range of motion and neck supple.  Neurological:     Mental Status: She is alert and oriented to person, place, and time.    BP (!) 162/90 (BP Location: Left Arm, Patient Position: Sitting, Cuff Size: Normal)  Pulse 61   Temp (!) 97.3 F (36.3 C) (Temporal)   Resp 16   Ht 5\' 3"  (1.6 m)   Wt 144 lb 6.4 oz (65.5 kg)   SpO2 99%   BMI 25.58 kg/m  Wt Readings from Last 3 Encounters:  09/15/19 144 lb 6.4 oz (65.5 kg)  07/24/19 147 lb 3.2 oz (66.8 kg)  11/21/17 147 lb 6.4 oz (66.9 kg)     Lab Results  Component Value Date   WBC 5.9 06/21/2016   HGB 15.6 (H) 06/21/2016   HCT 46.3 (H) 06/21/2016   PLT 241.0 06/21/2016   GLUCOSE 114 (H) 07/24/2019   CHOL 160 07/24/2019   TRIG 77.0 07/24/2019   HDL 69.00  07/24/2019   LDLCALC 76 07/24/2019   ALT 17 07/24/2019   AST 18 07/24/2019   NA 138 07/24/2019   K 4.0 07/24/2019   CL 101 07/24/2019   CREATININE 1.11 07/24/2019   BUN 27 (H) 07/24/2019   CO2 26 07/24/2019   TSH 2.48 12/18/2018   INR 1.04 06/02/2010   HGBA1C 6.2 07/24/2019   MICROALBUR <0.7 07/24/2019    DG Bone Density  Result Date: 03/07/2015 EXAM: DUAL X-RAY ABSORPTIOMETRY (DXA) FOR BONE MINERAL DENSITY IMPRESSION: Referring Physician:  Rosalita Chessman PATIENT: Name: Kathryn, [name] Patient ID: [id number] Birth Date: April 01, 1935 Height: 62.0 in. Sex: Female Measured: 03/07/2015 Weight: 145.0 lbs. Indications: Advanced Age, Caucasian, Post Menopausal, Secondary Osteoporosis Fractures: Wrist Treatments: ASSESSMENT: The BMD measured at Femur Neck Right is 0.866 g/cm2 with a T-score of -1.2. This [name] according to [facility]) criteria. Site Region Measured Date Measured Age WHO YA BMD Classification T-score AP Spine L1-L4 5/07/2015 [age] Normal 0.0 1.181 g/cm2 DualFemur Neck Right 5/07/2015 78.6 years Osteopenia -1.2 0.866 g/cm2 World Health Organization [facility]) criteria for post-menopausal, Caucasian Women: Normal       T-score at or above -1 SD Osteopenia   T-score between -1 and -2.5 SD Osteoporosis T-score at or below -2.5 SD RECOMMENDATION: [place] recommends that FDA-approved medical therapies be considered in postmenopausal women and men age 59 or older with a: 1. Hip or vertebral (clinical or morphometric) fracture. 2. T-score of < -2.5 at the spine or hip. 3. Ten-year fracture probability by FRAX of 3% or greater for hip fracture or 20% or greater for major osteoporotic fracture. All treatment decisions require clinical judgment and consideration of individual patient factors, including patient preferences, co-morbidities, previous drug use, risk factors not captured in the FRAX model (e.g. falls, vitamin D  deficiency, increased bone turnover, interval significant decline in bone density) and possible under - or over-estimation of fracture risk by FRAX. All patients should ensure an adequate intake of dietary calcium (1200 mg/d) and vitamin D (800 IU daily) unless contraindicated. FOLLOW-UP: People with diagnosed cases of osteoporosis or at high risk for fracture should have regular bone mineral density tests. For patients eligible for Medicare, routine testing is allowed once every 2 years. The testing frequency can be increased to one year for patients who have rapidly progressing disease, those who are receiving or discontinuing medical therapy to restore bone mass, or have additional risk factors. I have reviewed this report and agree with the above findings. Mark A. Thornton Papas, M.D. [facility] Radiology Patient: Dejanee, Thibeaux Referring Physician: Rosalita Chessman Birth Date: 20-Jan-1935 [age] Patient ID: [id number] Height: 62.0 in. Weight: 145.0 lbs. Measured: 03/07/2015 10:05:47 AM (16 SP 2) Sex:  Female Ethnicity: White Analyzed: 03/07/2015 10:10:38 AM (16 SP 2) FRAX* 10-year Probability of Fracture Based on femoral neck BMD: DualFemur (Right) Major Osteoporotic Fracture: 12.0% Hip Fracture:                2.4% Population:                  [place] (Caucasian) Risk Factors:                Secondary Osteoporosis ASSESSMENT: The probability of a major osteoporotic fracture is 12.0% within the next ten years. The probability of a hip fracture is 2.4% within the next ten years. *FRAX is a Armed forces logistics/support/administrative officer of the [employer] of [employer] for Metabolic Bone Disease, a World Science writer (WHO) [employer]. I have reviewed this report and agree with the above findings. Mark A. Tyron Russell, M.D. [facility] Radiology Electronically Signed   By: Ulyses Southward M.D.   On: 03/07/2015 10:30     Assessment & Plan:  Plan  I have discontinued Arihana Ambrocio. Murtaugh's losartan. I am also having her  start on olmesartan. Additionally, I am having her maintain her aspirin, Calcium-Vitamin D-Vitamin K, fluticasone, diazepam, potassium chloride SA, simvastatin, triamterene-hydrochlorothiazide, and metoprolol succinate.  Meds ordered this encounter  Medications  . olmesartan (BENICAR) 20 MG tablet    Sig: Take 1 tablet (20 mg total) by mouth daily.    Dispense:  30 tablet    Refill:  2    Problem List Items Addressed This Visit      Unprioritized   Essential hypertension - Primary   Relevant Medications   olmesartan (BENICAR) 20 MG tablet    Poorly controlled will alter medications, encouraged DASH diet, minimize caffeine and obtain adequate sleep. Report concerning symptoms and follow up as directed and as needed---- switch back to benicar  F/u 2-3 weeks   Follow-up: Return in about 2 weeks (around 09/29/2019), or if symptoms worsen or fail to improve, for nurse visit bp check.  Donato Schultz, DO

## 2019-09-16 ENCOUNTER — Encounter: Payer: Self-pay | Admitting: Family Medicine

## 2019-09-29 ENCOUNTER — Ambulatory Visit: Payer: Medicare Other

## 2019-09-29 ENCOUNTER — Telehealth: Payer: Self-pay | Admitting: Family Medicine

## 2019-09-29 NOTE — Progress Notes (Signed)
  Chronic Care Management   Outreach Note  09/29/2019 Name: [name] MRN: [id number] DOB: Sep 10, 1934  Referred by: Donato Schultz, DO Reason for referral : No chief complaint on file.   An unsuccessful telephone outreach was attempted today. The patient was referred to the pharmacist for assistance with care management and care coordination.   This note is not being shared with the patient for the following reason: To respect privacy (The patient or proxy has requested that the information not be shared).  Follow Up Plan:   Lynnae January [employer] Scheduler

## 2019-10-01 ENCOUNTER — Encounter: Payer: Self-pay | Admitting: Family Medicine

## 2019-10-01 DIAGNOSIS — H04123 Dry eye syndrome of bilateral lacrimal glands: Secondary | ICD-10-CM | POA: Diagnosis not present

## 2019-10-01 DIAGNOSIS — E119 Type 2 diabetes mellitus without complications: Secondary | ICD-10-CM | POA: Diagnosis not present

## 2019-10-01 DIAGNOSIS — Z961 Presence of intraocular lens: Secondary | ICD-10-CM | POA: Diagnosis not present

## 2019-10-01 DIAGNOSIS — H40013 Open angle with borderline findings, low risk, bilateral: Secondary | ICD-10-CM | POA: Diagnosis not present

## 2019-10-01 LAB — HM DIABETES EYE EXAM

## 2019-10-09 ENCOUNTER — Other Ambulatory Visit: Payer: Self-pay

## 2019-10-09 ENCOUNTER — Ambulatory Visit (INDEPENDENT_AMBULATORY_CARE_PROVIDER_SITE_OTHER): Payer: Medicare Other | Admitting: Family Medicine

## 2019-10-09 DIAGNOSIS — I10 Essential (primary) hypertension: Secondary | ICD-10-CM | POA: Diagnosis not present

## 2019-10-09 MED ORDER — OLMESARTAN MEDOXOMIL 40 MG PO TABS: 40.0000 mg | ORAL_TABLET | Freq: Every day | ORAL | 3 refills | Status: DC

## 2019-10-09 NOTE — Progress Notes (Addendum)
Pt here for Blood pressure check per Dr. Laury Axon  Pt currently takes:metoprolol 100 mg, olmesartan 20mg , and maxzide 75-50 mg 0.5 tablets daily.    Pt reports compliance with medication.   BP today @ = 170/93, rechecked manually 168/92 HR =65  Pt advised per Dr. , increase to olmesartan 40 mg follow up in 2 weeks.    Agree   Zola Button, DO

## 2019-10-22 ENCOUNTER — Ambulatory Visit: Payer: Medicare Other

## 2019-10-27 ENCOUNTER — Ambulatory Visit: Payer: Medicare Other | Admitting: Family Medicine

## 2019-10-27 ENCOUNTER — Other Ambulatory Visit: Payer: Self-pay

## 2019-10-27 ENCOUNTER — Other Ambulatory Visit: Payer: Self-pay | Admitting: Family Medicine

## 2019-10-27 DIAGNOSIS — I10 Essential (primary) hypertension: Secondary | ICD-10-CM

## 2019-10-27 MED ORDER — AMLODIPINE BESYLATE 2.5 MG PO TABS: 2.5000 mg | ORAL_TABLET | Freq: Every day | ORAL | 3 refills | Status: DC

## 2019-10-27 NOTE — Progress Notes (Signed)
Pt here for Blood pressure check per Dr.[name]  Pt currently takes: Metoprolol 100 mg & Maxzide 75- 50 mg & Olmesartan 40mg    Pt reports compliance with medication.  BP today @ = 169/81 HR =67  Pt advised per Dr.lowne to take Amlodipine 2.5 mg and follow up in 3 weeks.

## 2019-11-10 ENCOUNTER — Other Ambulatory Visit: Payer: Self-pay

## 2019-11-10 DIAGNOSIS — F411 Generalized anxiety disorder: Secondary | ICD-10-CM

## 2019-11-10 NOTE — Telephone Encounter (Signed)
Pt called stating she needs a refill on diazepam.  Pharmacy is [facility], [place].  Pt has a lot going on and really needs this filled ASAP.

## 2019-11-11 MED ORDER — DIAZEPAM 5 MG PO TABS: 5.0000 mg | ORAL_TABLET | Freq: Every day | ORAL | 0 refills | Status: DC

## 2019-11-11 NOTE — Telephone Encounter (Signed)
Requesting: Valium Contract: 10/21/2012 UDS: 10/20/2012 Last OV: 10/27/2019 Next OV: 11/19/2019 Last Refill: 07/24/2019, #45--0 RF Database:   Please advise

## 2019-11-19 ENCOUNTER — Ambulatory Visit: Payer: Medicare Other | Admitting: Family Medicine

## 2019-11-26 ENCOUNTER — Ambulatory Visit: Payer: Medicare Other | Admitting: Family Medicine

## 2019-11-30 ENCOUNTER — Ambulatory Visit (INDEPENDENT_AMBULATORY_CARE_PROVIDER_SITE_OTHER): Payer: Medicare Other | Admitting: Family Medicine

## 2019-11-30 ENCOUNTER — Encounter: Payer: Self-pay | Admitting: Family Medicine

## 2019-11-30 ENCOUNTER — Other Ambulatory Visit: Payer: Self-pay

## 2019-11-30 VITALS — BP 130/60 | HR 80 | Temp 98.0°F | Resp 18 | Ht 63.0 in | Wt 148.6 lb

## 2019-11-30 DIAGNOSIS — E1169 Type 2 diabetes mellitus with other specified complication: Secondary | ICD-10-CM | POA: Diagnosis not present

## 2019-11-30 DIAGNOSIS — E118 Type 2 diabetes mellitus with unspecified complications: Secondary | ICD-10-CM

## 2019-11-30 DIAGNOSIS — I10 Essential (primary) hypertension: Secondary | ICD-10-CM

## 2019-11-30 DIAGNOSIS — E1165 Type 2 diabetes mellitus with hyperglycemia: Secondary | ICD-10-CM | POA: Diagnosis not present

## 2019-11-30 DIAGNOSIS — E785 Hyperlipidemia, unspecified: Secondary | ICD-10-CM | POA: Diagnosis not present

## 2019-11-30 MED ORDER — AMLODIPINE BESYLATE 2.5 MG PO TABS: 2.5000 mg | ORAL_TABLET | Freq: Every day | ORAL | 1 refills | Status: DC

## 2019-11-30 MED ORDER — OLMESARTAN MEDOXOMIL 40 MG PO TABS: 40.0000 mg | ORAL_TABLET | Freq: Every day | ORAL | 1 refills | Status: DC

## 2019-11-30 NOTE — Patient Instructions (Signed)

## 2019-11-30 NOTE — Progress Notes (Signed)
Patient ID: [name], female    DOB: 02/05/1935  Age: 85 y.o. MRN: [id number]    Subjective:  Subjective  HPI LANEAH LUFT presents for f/u bp.  She will come back for labs No complaints  She is not checking her BS She does not need refills at this time  Review of Systems  Constitutional: Negative for appetite change, diaphoresis, fatigue and unexpected weight change.  Eyes: Negative for pain, redness and visual disturbance.  Respiratory: Negative for cough, chest tightness, shortness of breath and wheezing.   Cardiovascular: Negative for chest pain, palpitations and leg swelling.  Endocrine: Negative for cold intolerance, heat intolerance, polydipsia, polyphagia and polyuria.  Genitourinary: Negative for difficulty urinating, dysuria and frequency.  Neurological: Negative for dizziness, light-headedness, numbness and headaches.    History Past Medical History:  Diagnosis Date  . Abscess, rectum   . Diabetes mellitus    type 2  . Fatty liver disease, nonalcoholic   . Hyperlipidemia   . Hypertension   . Thyroid nodule     She has a past surgical history that includes Abdominal hysterectomy; Anal fissure repair; Tonsillectomy and adenoidectomy; Hemorrhoid surgery; Cataract extraction, bilateral (Feb); and Eye surgery (06/2013).   Her family history includes Diabetes in her brother, mother, and unknown relative; Hypertension in her brother, mother, sister, and unknown relative; Mental retardation in her sister; Other in her brother.She reports that she has never smoked. She has never used smokeless tobacco. She reports that she does not drink alcohol and does not use drugs.  Current Outpatient Medications on File Prior to Visit  Medication Sig Dispense Refill  . aspirin 81 MG tablet Take 81 mg by mouth daily.      . Calcium-Vitamin D-Vitamin K (VIACTIV) 500-500-40 MG-UNT-MCG CHEW Chew 2 each by mouth daily.    . diazepam (VALIUM) 5 MG tablet Take 1 tablet (5 mg  total) by mouth daily. 30 tablet 0  . fluticasone (FLONASE) 50 MCG/ACT nasal spray SHAKE LIQUID AND USE 2 SPRAYS IN EACH NOSTRIL DAILY 16 g 5  . metoprolol succinate (TOPROL-XL) 100 MG 24 hr tablet Take 1 tablet (100 mg total) by mouth daily. Take with or immediately following a meal. 90 tablet 3  . potassium chloride SA (KLOR-CON) 20 MEQ tablet TAKE 1 TABLET(20 MEQ) BY MOUTH DAILY 90 tablet 3  . simvastatin (ZOCOR) 20 MG tablet 1 po qd 90 tablet 1  . triamterene-hydrochlorothiazide (MAXZIDE) 75-50 MG tablet Take 0.5 tablets by mouth daily. 45 tablet 1   No current facility-administered medications on file prior to visit.     Objective:  Objective  Physical Exam Vitals and nursing note reviewed.  Constitutional:      Appearance: She is well-developed.  HENT:     Head: Normocephalic and atraumatic.  Eyes:     Conjunctiva/sclera: Conjunctivae normal.  Neck:     Thyroid: No thyromegaly.     Vascular: No carotid bruit or JVD.  Cardiovascular:     Rate and Rhythm: Normal rate and regular rhythm.     Heart sounds: Normal heart sounds. No murmur heard.   Pulmonary:     Effort: Pulmonary effort is normal. No respiratory distress.     Breath sounds: Normal breath sounds. No wheezing or rales.  Chest:     Chest wall: No tenderness.  Musculoskeletal:     Cervical back: Normal range of motion and neck supple.  Neurological:     Mental Status: She is alert and oriented to person, place, and  time.    BP (!) 130/60   Pulse 80   Temp 98 F (36.7 C) (Oral)   Resp 18   Ht 5\' 3"  (1.6 m)   Wt 148 lb 9.6 oz (67.4 kg)   SpO2 98%   BMI 26.32 kg/m  Wt Readings from Last 3 Encounters:  11/30/19 148 lb 9.6 oz (67.4 kg)  09/15/19 144 lb 6.4 oz (65.5 kg)  07/24/19 147 lb 3.2 oz (66.8 kg)     Lab Results  Component Value Date   WBC 5.9 06/21/2016   HGB 15.6 (H) 06/21/2016   HCT 46.3 (H) 06/21/2016   PLT 241.0 06/21/2016   GLUCOSE 114 (H) 07/24/2019   CHOL 160 07/24/2019   TRIG 77.0  07/24/2019   HDL 69.00 07/24/2019   LDLCALC 76 07/24/2019   ALT 17 07/24/2019   AST 18 07/24/2019   NA 138 07/24/2019   K 4.0 07/24/2019   CL 101 07/24/2019   CREATININE 1.11 07/24/2019   BUN 27 (H) 07/24/2019   CO2 26 07/24/2019   TSH 2.48 12/18/2018   INR 1.04 06/02/2010   HGBA1C 6.2 07/24/2019   MICROALBUR <0.7 07/24/2019    DG Bone Density  Result Date: 03/07/2015 EXAM: DUAL X-RAY ABSORPTIOMETRY (DXA) FOR BONE MINERAL DENSITY IMPRESSION: Referring Physician:  03/09/2015 PATIENT: Name: Idara, Woodside Patient ID: Shawnie Pons Birth Date: 19-Aug-1934 Height: 62.0 in. Sex: Female Measured: 03/07/2015 Weight: 145.0 lbs. Indications: Advanced Age, Caucasian, Post Menopausal, Secondary Osteoporosis Fractures: Wrist Treatments: ASSESSMENT: The BMD measured at Femur Neck Right is 0.866 g/cm2 with a T-score of -1.2. This [name] according to World Health Organization [facility]) criteria. Site Region Measured Date Measured Age WHO YA BMD Classification T-score AP Spine L1-L4 03/07/2015 78.4 Normal 0.0 1.181 g/cm2 DualFemur Neck Right 5/07/2015 78.4 [age] Osteopenia -1.2 0.866 g/cm2 World Health Organization [facility]) criteria for post-menopausal, Caucasian Women: Normal       T-score at or above -1 SD Osteopenia   T-score between -1 and -2.5 SD Osteoporosis T-score at or below -2.5 SD RECOMMENDATION: National Osteoporosis Foundation recommends that FDA-approved medical therapies be considered in postmenopausal women and men age 72 or older with a: 1. Hip or vertebral (clinical or morphometric) fracture. 2. T-score of < -2.5 at the spine or hip. 3. Ten-year fracture probability by FRAX of 3% or greater for hip fracture or 20% or greater for major osteoporotic fracture. All treatment decisions require clinical judgment and consideration of individual patient factors, including patient preferences, co-morbidities, previous drug use, risk factors not captured in the FRAX model (e.g.  falls, vitamin D deficiency, increased bone turnover, interval significant decline in bone density) and possible under - or over-estimation of fracture risk by FRAX. All patients should ensure an adequate intake of dietary calcium (1200 mg/d) and vitamin D (800 IU daily) unless contraindicated. FOLLOW-UP: People with diagnosed cases of osteoporosis or at high risk for fracture should have regular bone mineral density tests. For patients eligible for Medicare, routine testing is allowed once every 2 years. The testing frequency can be increased to one year for patients who have rapidly progressing disease, those who are receiving or discontinuing medical therapy to restore bone mass, or have additional risk factors. I have reviewed this report and agree with the above findings. Mark A. 44, M.D. [facility] Radiology Patient: Ameliya, Nicotra Referring Physician: Shawnie Pons Birth Date: 1935/01/18 [age] Patient ID: [id number] Height: 62.0 in. Weight: 145.0 lbs.  Measured: 03/07/2015 10:05:47 AM (16 SP 2) Sex: Female Ethnicity: White Analyzed: 03/07/2015 10:10:38 AM (16 SP 2) FRAX* 10-year Probability of Fracture Based on femoral neck BMD: DualFemur (Right) Major Osteoporotic Fracture: 12.0% Hip Fracture:                2.4% Population:                  [place] (Caucasian) Risk Factors:                Secondary Osteoporosis ASSESSMENT: The probability of a major osteoporotic fracture is 12.0% within the next ten years. The probability of a hip fracture is 2.4% within the next ten years. *FRAX is a Armed forces logistics/support/administrative officer of the [employer] for Metabolic Bone Disease, a World Science writer (WHO) [employer]. I have reviewed this report and agree with the above findings. Mark A. Tyron Russell, M.D. [facility] Radiology Electronically Signed   By: Ulyses Southward M.D.   On: 03/07/2015 10:30     Assessment & Plan:  Plan  I am having Elisha Headland. Nass maintain her aspirin,  Calcium-Vitamin D-Vitamin K, fluticasone, potassium chloride SA, simvastatin, triamterene-hydrochlorothiazide, metoprolol succinate, diazepam, olmesartan, and amLODipine.  Meds ordered this encounter  Medications  . olmesartan (BENICAR) 40 MG tablet    Sig: Take 1 tablet (40 mg total) by mouth daily.    Dispense:  90 tablet    Refill:  1  . amLODipine (NORVASC) 2.5 MG tablet    Sig: Take 1 tablet (2.5 mg total) by mouth daily.    Dispense:  90 tablet    Refill:  1    Problem List Items Addressed This Visit      Unprioritized   Essential hypertension - Primary    Well controlled, no changes to meds. Encouraged heart healthy diet such as the DASH diet and exercise as tolerated.  Will return for labs and cpe      Relevant Medications   olmesartan (BENICAR) 40 MG tablet   amLODipine (NORVASC) 2.5 MG tablet   Other Relevant Orders   Lipid panel   Hemoglobin A1c   Comprehensive metabolic panel   Hyperlipidemia associated with type 2 diabetes mellitus (HCC)    Encouraged heart healthy diet, increase exercise, avoid trans fats, consider a krill oil cap daily      Relevant Medications   olmesartan (BENICAR) 40 MG tablet   Type 2 diabetes mellitus with complication, without long-term current use of insulin (HCC)    Check labs       Relevant Medications   olmesartan (BENICAR) 40 MG tablet    Other Visit Diagnoses    Uncontrolled type 2 diabetes mellitus with hyperglycemia (HCC)       Relevant Medications   olmesartan (BENICAR) 40 MG tablet   Other Relevant Orders   Hemoglobin A1c   Comprehensive metabolic panel      Follow-up: Return in about 6 months (around 06/01/2020), or if symptoms worsen or fail to improve, for hypertension, fasting.  Donato Schultz, DO

## 2019-11-30 NOTE — Assessment & Plan Note (Signed)
Check labs 

## 2019-11-30 NOTE — Assessment & Plan Note (Signed)
Encouraged heart healthy diet, increase exercise, avoid trans fats, consider a krill oil cap daily 

## 2019-11-30 NOTE — Assessment & Plan Note (Addendum)
Well controlled, no changes to meds. Encouraged heart healthy diet such as the DASH diet and exercise as tolerated.  Will return for labs and cpe

## 2019-12-14 ENCOUNTER — Other Ambulatory Visit: Payer: Self-pay | Admitting: Family Medicine

## 2019-12-14 DIAGNOSIS — I10 Essential (primary) hypertension: Secondary | ICD-10-CM

## 2019-12-17 ENCOUNTER — Telehealth: Payer: Self-pay | Admitting: Family Medicine

## 2019-12-17 NOTE — Progress Notes (Signed)
  Chronic Care Management   Outreach Note  12/17/2019 Name: [name] MRN: [id number] DOB: 13-Jul-1934  Referred by: Donato Schultz, DO Reason for referral : No chief complaint on file.   A second unsuccessful telephone outreach was attempted today. The patient was referred to pharmacist for assistance with care management and care coordination.  Follow Up Plan:   Carley Perdue [employer] Scheduler

## 2019-12-24 ENCOUNTER — Telehealth: Payer: Self-pay | Admitting: Family Medicine

## 2019-12-24 NOTE — Progress Notes (Signed)
°  Chronic Care Management   Outreach Note  12/24/2019 Name: ALECHIA LEZAMA MRN: [id number] DOB: 10-06-1934  Referred by: Donato Schultz, DO Reason for referral : No chief complaint on file.   Third unsuccessful telephone outreach was attempted today. The patient was referred to the pharmacist for assistance with care management and care coordination.   Follow Up Plan:   Carley Perdue [employer] Scheduler

## 2020-01-14 ENCOUNTER — Other Ambulatory Visit: Payer: Self-pay | Admitting: Family Medicine

## 2020-01-14 DIAGNOSIS — F411 Generalized anxiety disorder: Secondary | ICD-10-CM

## 2020-01-14 NOTE — Telephone Encounter (Signed)
Last written: 11/11/19 Last ov: 11/30/19 Next ov: 02/04/20 Contract: NONE  HYI:FOYD

## 2020-02-04 ENCOUNTER — Other Ambulatory Visit: Payer: Medicare Other

## 2020-02-16 ENCOUNTER — Other Ambulatory Visit: Payer: Self-pay | Admitting: Family Medicine

## 2020-02-16 DIAGNOSIS — I10 Essential (primary) hypertension: Secondary | ICD-10-CM

## 2020-02-26 NOTE — Addendum Note (Signed)
Addended by: Mervin Kung A on: 02/26/2020 03:44 PM   Modules accepted: Orders

## 2020-03-03 ENCOUNTER — Other Ambulatory Visit: Payer: Medicare Other

## 2020-03-14 NOTE — Addendum Note (Signed)
Addended by: Mervin Kung A on: 03/14/2020 03:43 PM   Modules accepted: Orders

## 2020-03-18 ENCOUNTER — Other Ambulatory Visit: Payer: Medicare Other

## 2020-03-25 ENCOUNTER — Other Ambulatory Visit: Payer: Medicare Other

## 2020-04-07 ENCOUNTER — Other Ambulatory Visit (INDEPENDENT_AMBULATORY_CARE_PROVIDER_SITE_OTHER): Payer: Medicare Other

## 2020-04-07 ENCOUNTER — Other Ambulatory Visit: Payer: Self-pay

## 2020-04-07 DIAGNOSIS — I10 Essential (primary) hypertension: Secondary | ICD-10-CM | POA: Diagnosis not present

## 2020-04-07 DIAGNOSIS — E1165 Type 2 diabetes mellitus with hyperglycemia: Secondary | ICD-10-CM | POA: Diagnosis not present

## 2020-04-07 LAB — LIPID PANEL
Cholesterol: 170 mg/dL (ref 0–200)
HDL: 73.5 mg/dL (ref >39.00)
LDL Cholesterol: 79 mg/dL (ref 0–99)
NonHDL: 96.39
Total CHOL/HDL Ratio: 2
Triglycerides: 86 mg/dL (ref 0.0–149.0)
VLDL: 17.2 mg/dL (ref 0.0–40.0)

## 2020-04-07 LAB — HEMOGLOBIN A1C: Hgb A1c MFr Bld: 6.2 % (ref 4.6–6.5)

## 2020-04-07 LAB — COMPREHENSIVE METABOLIC PANEL
ALT: 17 U/L (ref 0–35)
AST: 20 U/L (ref 0–37)
Albumin: 4.6 g/dL (ref 3.5–5.2)
Alkaline Phosphatase: 75 U/L (ref 39–117)
BUN: 21 mg/dL (ref 6–23)
CO2: 23 mEq/L (ref 19–32)
Calcium: 9.8 mg/dL (ref 8.4–10.5)
Chloride: 103 mEq/L (ref 96–112)
Creatinine, Ser: 1 mg/dL (ref 0.40–1.20)
GFR: 52.1 mL/min — ABNORMAL LOW (ref >60.00)
Glucose, Bld: 118 mg/dL — ABNORMAL HIGH (ref 70–99)
Potassium: 4 mEq/L (ref 3.5–5.1)
Sodium: 139 mEq/L (ref 135–145)
Total Bilirubin: 1.2 mg/dL (ref 0.2–1.2)
Total Protein: 7.2 g/dL (ref 6.0–8.3)

## 2020-04-14 DIAGNOSIS — Z23 Encounter for immunization: Secondary | ICD-10-CM | POA: Diagnosis not present

## 2020-05-16 ENCOUNTER — Other Ambulatory Visit: Payer: Self-pay | Admitting: Family Medicine

## 2020-05-16 DIAGNOSIS — I10 Essential (primary) hypertension: Secondary | ICD-10-CM

## 2020-06-08 ENCOUNTER — Other Ambulatory Visit: Payer: Self-pay | Admitting: *Deleted

## 2020-06-08 DIAGNOSIS — E785 Hyperlipidemia, unspecified: Secondary | ICD-10-CM

## 2020-06-08 MED ORDER — SIMVASTATIN 20 MG PO TABS: 20.0000 mg | ORAL_TABLET | Freq: Every day | ORAL | 0 refills | Status: DC

## 2020-06-14 ENCOUNTER — Other Ambulatory Visit: Payer: Self-pay | Admitting: Family Medicine

## 2020-06-14 DIAGNOSIS — I10 Essential (primary) hypertension: Secondary | ICD-10-CM

## 2020-06-16 ENCOUNTER — Other Ambulatory Visit: Payer: Self-pay | Admitting: *Deleted

## 2020-06-16 DIAGNOSIS — I10 Essential (primary) hypertension: Secondary | ICD-10-CM

## 2020-06-16 MED ORDER — METOPROLOL SUCCINATE ER 100 MG PO TB24: 100.0000 mg | ORAL_TABLET | Freq: Every day | ORAL | 0 refills | Status: DC

## 2020-07-06 ENCOUNTER — Other Ambulatory Visit: Payer: Self-pay

## 2020-07-06 DIAGNOSIS — I10 Essential (primary) hypertension: Secondary | ICD-10-CM

## 2020-07-06 MED ORDER — AMLODIPINE BESYLATE 2.5 MG PO TABS: 2.5000 mg | ORAL_TABLET | Freq: Every day | ORAL | 1 refills | Status: DC

## 2020-07-14 ENCOUNTER — Other Ambulatory Visit: Payer: Self-pay | Admitting: Family Medicine

## 2020-07-14 DIAGNOSIS — I10 Essential (primary) hypertension: Secondary | ICD-10-CM

## 2020-08-16 ENCOUNTER — Other Ambulatory Visit: Payer: Self-pay

## 2020-08-16 DIAGNOSIS — F411 Generalized anxiety disorder: Secondary | ICD-10-CM

## 2020-08-16 MED ORDER — DIAZEPAM 5 MG PO TABS: ORAL_TABLET | ORAL | 2 refills | Status: DC

## 2020-08-16 NOTE — Telephone Encounter (Signed)
Requesting:Diazepam 5 mg Contract: 10/21/12 UDS: 10/21/12 Last Visit: 11/29/2020 Next Visit: none Last Refill: 01/14/2020

## 2020-08-31 ENCOUNTER — Other Ambulatory Visit: Payer: Self-pay

## 2020-08-31 DIAGNOSIS — I10 Essential (primary) hypertension: Secondary | ICD-10-CM

## 2020-08-31 MED ORDER — METOPROLOL SUCCINATE ER 100 MG PO TB24: ORAL_TABLET | ORAL | 1 refills | Status: DC

## 2020-09-02 ENCOUNTER — Telehealth: Payer: Self-pay | Admitting: Family Medicine

## 2020-09-02 DIAGNOSIS — I10 Essential (primary) hypertension: Secondary | ICD-10-CM

## 2020-09-02 MED ORDER — OLMESARTAN MEDOXOMIL 40 MG PO TABS: 40.0000 mg | ORAL_TABLET | Freq: Every day | ORAL | 0 refills | Status: DC

## 2020-09-02 NOTE — Telephone Encounter (Signed)
Medication refilled. Patient needs appointment for further refills.

## 2020-09-02 NOTE — Telephone Encounter (Signed)
Medication: olmesartan (BENICAR) 40 MG tablet [[id number]   Has the patient contacted their pharmacy? Yes.   (If no, request that the patient contact the pharmacy for the refill.) (If yes, when and what did the pharmacy advise?)  Preferred Pharmacy (with phone number or street name):  [id number] [place], [place] - [address] AT [facility] [address]  [address] [place] [address]  Phone:  [phone] Fax:  [phone]  DEA #:  [id number]  DAW Reason: --       Agent: Please be advised that RX refills may take up to 3 business days. We ask that you follow-up with your pharmacy.

## 2020-09-05 ENCOUNTER — Other Ambulatory Visit: Payer: Self-pay | Admitting: Family Medicine

## 2020-09-05 DIAGNOSIS — E785 Hyperlipidemia, unspecified: Secondary | ICD-10-CM

## 2020-09-30 ENCOUNTER — Other Ambulatory Visit: Payer: Self-pay | Admitting: Family Medicine

## 2020-09-30 DIAGNOSIS — I10 Essential (primary) hypertension: Secondary | ICD-10-CM

## 2020-10-05 ENCOUNTER — Other Ambulatory Visit: Payer: Self-pay

## 2020-10-05 DIAGNOSIS — I10 Essential (primary) hypertension: Secondary | ICD-10-CM

## 2020-10-05 MED ORDER — TRIAMTERENE-HCTZ 75-50 MG PO TABS: 0.5000 | ORAL_TABLET | Freq: Every day | ORAL | 2 refills | Status: DC

## 2020-10-19 DIAGNOSIS — Z780 Asymptomatic menopausal state: Secondary | ICD-10-CM | POA: Diagnosis not present

## 2020-10-19 DIAGNOSIS — M545 Low back pain, unspecified: Secondary | ICD-10-CM | POA: Diagnosis not present

## 2020-10-19 DIAGNOSIS — M8580 Other specified disorders of bone density and structure, unspecified site: Secondary | ICD-10-CM | POA: Diagnosis not present

## 2020-10-19 DIAGNOSIS — G8929 Other chronic pain: Secondary | ICD-10-CM | POA: Diagnosis not present

## 2020-10-19 DIAGNOSIS — M5459 Other low back pain: Secondary | ICD-10-CM | POA: Diagnosis not present

## 2020-10-19 DIAGNOSIS — M4014 Other secondary kyphosis, thoracic region: Secondary | ICD-10-CM | POA: Diagnosis not present

## 2020-10-29 ENCOUNTER — Other Ambulatory Visit: Payer: Self-pay | Admitting: Family Medicine

## 2020-10-29 DIAGNOSIS — I10 Essential (primary) hypertension: Secondary | ICD-10-CM

## 2020-10-31 ENCOUNTER — Other Ambulatory Visit: Payer: Self-pay | Admitting: Family Medicine

## 2020-10-31 DIAGNOSIS — I10 Essential (primary) hypertension: Secondary | ICD-10-CM

## 2020-11-21 DIAGNOSIS — R293 Abnormal posture: Secondary | ICD-10-CM | POA: Diagnosis not present

## 2020-11-21 DIAGNOSIS — M6281 Muscle weakness (generalized): Secondary | ICD-10-CM | POA: Diagnosis not present

## 2020-11-21 DIAGNOSIS — M545 Low back pain, unspecified: Secondary | ICD-10-CM | POA: Diagnosis not present

## 2020-11-21 DIAGNOSIS — R262 Difficulty in walking, not elsewhere classified: Secondary | ICD-10-CM | POA: Diagnosis not present

## 2020-11-21 DIAGNOSIS — G8929 Other chronic pain: Secondary | ICD-10-CM | POA: Diagnosis not present

## 2020-11-28 DIAGNOSIS — M6281 Muscle weakness (generalized): Secondary | ICD-10-CM | POA: Diagnosis not present

## 2020-11-28 DIAGNOSIS — R262 Difficulty in walking, not elsewhere classified: Secondary | ICD-10-CM | POA: Diagnosis not present

## 2020-11-28 DIAGNOSIS — M545 Low back pain, unspecified: Secondary | ICD-10-CM | POA: Diagnosis not present

## 2020-11-28 DIAGNOSIS — R293 Abnormal posture: Secondary | ICD-10-CM | POA: Diagnosis not present

## 2020-11-28 DIAGNOSIS — G8929 Other chronic pain: Secondary | ICD-10-CM | POA: Diagnosis not present

## 2020-12-21 DIAGNOSIS — M4014 Other secondary kyphosis, thoracic region: Secondary | ICD-10-CM | POA: Diagnosis not present

## 2020-12-21 DIAGNOSIS — M25552 Pain in left hip: Secondary | ICD-10-CM | POA: Diagnosis not present

## 2020-12-21 DIAGNOSIS — M25551 Pain in right hip: Secondary | ICD-10-CM | POA: Diagnosis not present

## 2020-12-21 DIAGNOSIS — M545 Low back pain, unspecified: Secondary | ICD-10-CM | POA: Diagnosis not present

## 2020-12-21 DIAGNOSIS — G8929 Other chronic pain: Secondary | ICD-10-CM | POA: Diagnosis not present

## 2020-12-31 ENCOUNTER — Other Ambulatory Visit: Payer: Self-pay | Admitting: Family Medicine

## 2020-12-31 DIAGNOSIS — I10 Essential (primary) hypertension: Secondary | ICD-10-CM

## 2021-01-03 DIAGNOSIS — G8929 Other chronic pain: Secondary | ICD-10-CM | POA: Diagnosis not present

## 2021-01-03 DIAGNOSIS — M6281 Muscle weakness (generalized): Secondary | ICD-10-CM | POA: Diagnosis not present

## 2021-01-03 DIAGNOSIS — R293 Abnormal posture: Secondary | ICD-10-CM | POA: Diagnosis not present

## 2021-01-03 DIAGNOSIS — M545 Low back pain, unspecified: Secondary | ICD-10-CM | POA: Diagnosis not present

## 2021-01-03 DIAGNOSIS — R262 Difficulty in walking, not elsewhere classified: Secondary | ICD-10-CM | POA: Diagnosis not present

## 2021-01-10 ENCOUNTER — Other Ambulatory Visit: Payer: Self-pay | Admitting: Family Medicine

## 2021-01-10 DIAGNOSIS — I10 Essential (primary) hypertension: Secondary | ICD-10-CM

## 2021-01-13 ENCOUNTER — Other Ambulatory Visit: Payer: Self-pay | Admitting: Family Medicine

## 2021-01-13 DIAGNOSIS — I10 Essential (primary) hypertension: Secondary | ICD-10-CM

## 2021-01-17 DIAGNOSIS — M6281 Muscle weakness (generalized): Secondary | ICD-10-CM | POA: Diagnosis not present

## 2021-01-17 DIAGNOSIS — G8929 Other chronic pain: Secondary | ICD-10-CM | POA: Diagnosis not present

## 2021-01-17 DIAGNOSIS — R262 Difficulty in walking, not elsewhere classified: Secondary | ICD-10-CM | POA: Diagnosis not present

## 2021-01-17 DIAGNOSIS — M545 Low back pain, unspecified: Secondary | ICD-10-CM | POA: Diagnosis not present

## 2021-01-17 DIAGNOSIS — R293 Abnormal posture: Secondary | ICD-10-CM | POA: Diagnosis not present

## 2021-02-03 ENCOUNTER — Other Ambulatory Visit: Payer: Self-pay | Admitting: Family Medicine

## 2021-02-03 DIAGNOSIS — I10 Essential (primary) hypertension: Secondary | ICD-10-CM

## 2021-02-09 DIAGNOSIS — M5459 Other low back pain: Secondary | ICD-10-CM | POA: Diagnosis not present

## 2021-02-09 DIAGNOSIS — M79604 Pain in right leg: Secondary | ICD-10-CM | POA: Diagnosis not present

## 2021-02-09 DIAGNOSIS — G8929 Other chronic pain: Secondary | ICD-10-CM | POA: Diagnosis not present

## 2021-02-09 DIAGNOSIS — M545 Low back pain, unspecified: Secondary | ICD-10-CM | POA: Diagnosis not present

## 2021-02-12 ENCOUNTER — Other Ambulatory Visit: Payer: Self-pay | Admitting: Family Medicine

## 2021-02-12 DIAGNOSIS — I10 Essential (primary) hypertension: Secondary | ICD-10-CM

## 2021-02-13 ENCOUNTER — Other Ambulatory Visit: Payer: Self-pay | Admitting: Family Medicine

## 2021-02-13 DIAGNOSIS — I10 Essential (primary) hypertension: Secondary | ICD-10-CM

## 2021-02-14 ENCOUNTER — Other Ambulatory Visit: Payer: Self-pay | Admitting: Family Medicine

## 2021-02-14 DIAGNOSIS — I10 Essential (primary) hypertension: Secondary | ICD-10-CM

## 2021-02-16 DIAGNOSIS — R293 Abnormal posture: Secondary | ICD-10-CM | POA: Diagnosis not present

## 2021-02-16 DIAGNOSIS — M545 Low back pain, unspecified: Secondary | ICD-10-CM | POA: Diagnosis not present

## 2021-02-16 DIAGNOSIS — R262 Difficulty in walking, not elsewhere classified: Secondary | ICD-10-CM | POA: Diagnosis not present

## 2021-02-16 DIAGNOSIS — G8929 Other chronic pain: Secondary | ICD-10-CM | POA: Diagnosis not present

## 2021-02-16 DIAGNOSIS — M6281 Muscle weakness (generalized): Secondary | ICD-10-CM | POA: Diagnosis not present

## 2021-03-13 ENCOUNTER — Other Ambulatory Visit: Payer: Self-pay | Admitting: Family Medicine

## 2021-03-13 DIAGNOSIS — I10 Essential (primary) hypertension: Secondary | ICD-10-CM

## 2021-03-17 ENCOUNTER — Ambulatory Visit: Payer: Medicare Other | Admitting: Family Medicine

## 2021-03-21 ENCOUNTER — Ambulatory Visit: Payer: Medicare Other | Admitting: Family Medicine

## 2021-03-21 ENCOUNTER — Other Ambulatory Visit: Payer: Self-pay | Admitting: Family Medicine

## 2021-03-21 DIAGNOSIS — F411 Generalized anxiety disorder: Secondary | ICD-10-CM

## 2021-03-21 NOTE — Telephone Encounter (Signed)
Requesting: diazepam 5mg  Contract: 07/24/2019 UDS: 10/20/2012 Last Visit: 11/30/2019 Next Visit: 03/27/2021 Last Refill: 08/16/2020 #30 and 2RF  Please Advise

## 2021-03-27 ENCOUNTER — Other Ambulatory Visit: Payer: Self-pay

## 2021-03-27 ENCOUNTER — Encounter: Payer: Self-pay | Admitting: Family Medicine

## 2021-03-27 ENCOUNTER — Ambulatory Visit (INDEPENDENT_AMBULATORY_CARE_PROVIDER_SITE_OTHER): Payer: Medicare Other | Admitting: Family Medicine

## 2021-03-27 VITALS — BP 136/90 | HR 77 | Temp 97.5°F | Resp 18 | Ht 63.0 in | Wt 133.8 lb

## 2021-03-27 DIAGNOSIS — I10 Essential (primary) hypertension: Secondary | ICD-10-CM

## 2021-03-27 DIAGNOSIS — M62838 Other muscle spasm: Secondary | ICD-10-CM | POA: Diagnosis not present

## 2021-03-27 DIAGNOSIS — R739 Hyperglycemia, unspecified: Secondary | ICD-10-CM

## 2021-03-27 DIAGNOSIS — Z23 Encounter for immunization: Secondary | ICD-10-CM | POA: Diagnosis not present

## 2021-03-27 LAB — COMPREHENSIVE METABOLIC PANEL
ALT: 13 U/L (ref 0–35)
AST: 17 U/L (ref 0–37)
Albumin: 4.4 g/dL (ref 3.5–5.2)
Alkaline Phosphatase: 94 U/L (ref 39–117)
BUN: 23 mg/dL (ref 6–23)
CO2: 26 mEq/L (ref 19–32)
Calcium: 9.8 mg/dL (ref 8.4–10.5)
Chloride: 102 mEq/L (ref 96–112)
Creatinine, Ser: 0.99 mg/dL (ref 0.40–1.20)
GFR: 52.37 mL/min — ABNORMAL LOW (ref >60.00)
Glucose, Bld: 113 mg/dL — ABNORMAL HIGH (ref 70–99)
Potassium: 3.8 mEq/L (ref 3.5–5.1)
Sodium: 139 mEq/L (ref 135–145)
Total Bilirubin: 0.9 mg/dL (ref 0.2–1.2)
Total Protein: 7 g/dL (ref 6.0–8.3)

## 2021-03-27 LAB — LIPID PANEL
Cholesterol: 159 mg/dL (ref 0–200)
HDL: 63.8 mg/dL (ref >39.00)
LDL Cholesterol: 79 mg/dL (ref 0–99)
NonHDL: 95.35
Total CHOL/HDL Ratio: 2
Triglycerides: 82 mg/dL (ref 0.0–149.0)
VLDL: 16.4 mg/dL (ref 0.0–40.0)

## 2021-03-27 LAB — HEMOGLOBIN A1C: Hgb A1c MFr Bld: 6.2 % (ref 4.6–6.5)

## 2021-03-27 MED ORDER — AMLODIPINE BESYLATE 2.5 MG PO TABS: ORAL_TABLET | ORAL | 2 refills | Status: DC

## 2021-03-27 MED ORDER — METHOCARBAMOL 500 MG PO TABS: 500.0000 mg | ORAL_TABLET | Freq: Four times a day (QID) | ORAL | 1 refills | Status: DC | PRN

## 2021-03-27 NOTE — Assessment & Plan Note (Signed)
Muscle relaxer prn  Heat / ice  F/u ortho

## 2021-03-27 NOTE — Patient Instructions (Signed)
Lumbosacral Radiculopathy °Lumbosacral radiculopathy is a condition that involves the spinal nerves and nerve roots in the low back and bottom of the spine. The condition develops when these nerves and nerve roots move out of place or become inflamed and cause symptoms. °What are the causes? °This condition may be caused by: °Pressure from a disk that bulges out of place (herniated disk). A disk is a plate of soft cartilage that separates bones in the spine. °Disk changes that occur with age (disk degeneration). °A narrowing of the bones of the lower back (spinal stenosis). °A tumor. °An infection. °An injury that places sudden pressure on the disks that cushion the bones of your lower spine. °What increases the risk? °You are more likely to develop this condition if: °You are a female who is 30-50 years old. °You are a female who is 50-60 years old. °You use improper technique when lifting things. °You are overweight or live a sedentary lifestyle. °You smoke. °Your work requires frequent lifting. °You do repetitive activities that strain the spine. °What are the signs or symptoms? °Symptoms of this condition include: °Pain that goes down from your back into your legs (sciatica), usually on one side of the body. This is the most common symptom. The pain may be worse when you sit, cough, or sneeze. °Tingling and numbness in your legs. °Muscle weakness in your legs. °Loss of bladder control or bowel control. °How is this diagnosed? °This condition may be diagnosed based on: °Your symptoms and medical history. °A physical exam. °If the pain lasts, you may have tests, such as: °MRI. °X-ray. °CT scan. °A type of CT scan used to examine the spinal canal after injecting a dye into your spine (myelogram). °A test to measure how electrical impulses move through a nerve (nerve conduction study). °A test to measure the electrical activity in muscles (electromyogram). °How is this treated? °In many cases, treatment is not needed  for this condition. With rest, the condition usually gets better over time. If treatment is needed, it may include: °Working with a physical therapist to improve strength and flexibility. °Taking pain medicine. °Applying heat or ice or both to the affected areas. °Having chiropractic spinal manipulation. °Using transcutaneous electrical nerve stimulation (TENS) therapy. °Getting a steroid injection in the spine. °Having surgery. This may be needed if other treatments do not help. Different types of surgery may be done depending on the cause of this condition. °Follow these instructions at home: °Activity °Avoid bending and any other activities that make the problem worse. °Maintain a proper position when standing or sitting. °When standing, keep your upper back and neck straight with your shoulders pulled back. Avoid slouching. °When sitting, keep your back straight and relax your shoulders. Do not round your shoulders or pull them backward. °Do not sit or stand in one place for long periods of time. °Take brief periods of rest throughout the day. This will reduce your pain. It is usually better to rest by lying down or standing, not sitting. °Mix in mild activity or stretching between long periods of rest. This will help to prevent stiffness and pain. °Get regular exercise. Ask your health care provider what activities are safe for you. If you were shown how to do any exercises or stretches, do them as told by your health care provider. °You may have to avoid lifting. Ask your health care provider how much you can safely lift. °Always use proper lifting technique, which includes: °Bending your knees. °Keeping the load close   to your body. °Avoiding twisting. °Managing pain °If directed, put ice on the affected area. To do this: °Put ice in a plastic bag. °Place a towel between your skin and the bag. °Leave the ice on for 20 minutes, 2-3 times a day. °Remove the ice if your skin turns bright red. This is very  important. If you cannot feel pain, heat, or cold, you have a greater risk of damage to the area. °If directed, apply heat to the affected area as often as told by your health care provider. Use the heat source that your health care provider recommends, such as a moist heat pack or a heating pad. °Place a towel between your skin and the heat source. °Leave the heat on for 20-30 minutes. °Remove the heat if your skin turns bright red. This is especially important if you are unable to feel pain, heat, or cold. You have a greater risk of getting burned. °Take over-the-counter and prescription medicines only as told by your health care provider. °General instructions °Sleep on a firm mattress in a comfortable position. Try lying on your side with your knees slightly bent. If you lie on your back, put a pillow under your knees. °Ask your health care provider if the medicine prescribed to you requires you to avoid driving or using machinery. °If your health care provider prescribed a diet or exercise program, follow it as told. °Keep all follow-up visits. This is important. °Contact a health care provider if: °Your pain does not get better over time, even when taking pain medicines. °Get help right away if: °You develop severe pain. °Your pain suddenly gets worse. °You develop increasing weakness in your legs. °You lose the ability to control your bladder or bowel. °You have difficulty walking or balancing. °You have a fever. °Summary °Lumbosacral radiculopathy is a condition that occurs when the spinal nerves and nerve roots in the lower part of the spine move out of place or become inflamed and cause symptoms. °Symptoms include pain, numbness, and tingling that go down from your back into your legs (sciatica), muscle weakness, and loss of bladder control or bowel control. °If directed, apply ice or heat or both to the affected area as told by your health care provider. °Follow instructions about activity, rest, and  proper lifting technique. °This information is not intended to replace advice given to you by your health care provider. Make sure you discuss any questions you have with your health care provider. °Document Revised: 10/27/2020 Document Reviewed: 10/27/2020 °Elsevier Patient Education © 2022 [employer] ° °

## 2021-03-27 NOTE — Progress Notes (Signed)
Established Patient Office Visit  Subjective:  Patient ID: [name], female    DOB: 10-30-1934  Age: 86 y.o. MRN: [id number]  CC:  Chief Complaint  Patient presents with   Leg Pain    Right leg, pt states pain going on since June. Pt states seeing ortho doc at [facility] for back. Pt states no falls or injuries.     HPI Kathryn MAPLE presents for c/o R leg pain.   Ortho thought it might be related to her back pain.   No new injury.   See ortho notes in care everywhere   Past Medical History:  Diagnosis Date   Abscess, rectum    Diabetes mellitus    type 2   Fatty liver disease, nonalcoholic    Hyperlipidemia    Hypertension    Thyroid nodule     Past Surgical History:  Procedure Laterality Date   ABDOMINAL HYSTERECTOMY     ANAL FISSURE REPAIR     CATARACT EXTRACTION, BILATERAL  Feb   EYE SURGERY  06/2013   cataract b/l   HEMORRHOID SURGERY     TONSILLECTOMY AND ADENOIDECTOMY      Family History  Problem Relation Age of Onset   Other Brother        Rhabdo secondary to Statin   Diabetes Brother    Hypertension Brother    Diabetes Mother    Hypertension Mother    Hypertension Sister    Mental retardation Sister    Diabetes Unknown    Hypertension Unknown     Social History   Socioeconomic History   Marital status: Married    Spouse name: Not on file   Number of children: Not on file   Years of education: Not on file   Highest education level: Not on file  Occupational History   Occupation: retired Runner, broadcasting/film/video  Tobacco Use   Smoking status: Never   Smokeless tobacco: Never  Substance and Sexual Activity   Alcohol use: No   Drug use: No   Sexual activity: Yes    Partners: Male  Other Topics Concern   Not on file  Social History Narrative   Exercise- walks everyday   Lives with husband   Social Determinants of Health   Financial Resource Strain: Not on file  Food Insecurity: Not on file  Transportation Needs: Not on file  Physical  Activity: Not on file  Stress: Not on file  Social Connections: Not on file  Intimate Partner Violence: Not on file    Outpatient Medications Prior to Visit  Medication Sig Dispense Refill   aspirin 81 MG tablet Take 81 mg by mouth daily.       Calcium-Vitamin D-Vitamin K 500-500-40 MG-UNT-MCG CHEW Chew 2 each by mouth daily.     diazepam (VALIUM) 5 MG tablet TAKE 1 TABLET(5 MG) BY MOUTH DAILY 30 tablet 0   diclofenac (VOLTAREN) 75 MG EC tablet Take by mouth.     fluticasone (FLONASE) 50 MCG/ACT nasal spray SHAKE LIQUID AND USE 2 SPRAYS IN EACH NOSTRIL DAILY 16 g 5   metoprolol succinate (TOPROL-XL) 100 MG 24 hr tablet TAKE 1 TABLET(100 MG) BY MOUTH DAILY WITH OR IMMEDIATELY FOLLOWING A MEAL 90 tablet 1   olmesartan (BENICAR) 40 MG tablet TAKE 1 TABLET(40 MG) BY MOUTH DAILY 90 tablet 1   potassium chloride SA (KLOR-CON) 20 MEQ tablet TAKE 1 TABLET(20 MEQ) BY MOUTH DAILY 90 tablet 3   simvastatin (ZOCOR) 20 MG tablet TAKE 1 TABLET(20  MG) BY MOUTH DAILY 90 tablet 0   triamterene-hydrochlorothiazide (MAXZIDE) 75-50 MG tablet Take 0.5 tablets by mouth daily. 45 tablet 2   amLODipine (NORVASC) 2.5 MG tablet TAKE 1 TABLET(2.5 MG) BY MOUTH DAILY 30 tablet 0   No facility-administered medications prior to visit.    Allergies  Allergen Reactions   Sulfonamide Derivatives Other (See Comments)    headache    ROS Review of Systems  Constitutional:  Negative for appetite change, diaphoresis, fatigue and unexpected weight change.  Eyes:  Negative for pain, redness and visual disturbance.  Respiratory:  Negative for cough, chest tightness, shortness of breath and wheezing.   Cardiovascular:  Negative for chest pain, palpitations and leg swelling.  Endocrine: Negative for cold intolerance, heat intolerance, polydipsia, polyphagia and polyuria.  Genitourinary:  Negative for difficulty urinating, dysuria and frequency.  Musculoskeletal:  Positive for arthralgias, gait problem and myalgias.   Neurological:  Negative for dizziness, light-headedness, numbness and headaches.     Objective:    Physical Exam  BP 136/90 (BP Location: Right Arm, Patient Position: Sitting, Cuff Size: Normal)   Pulse 77   Temp (!) 97.5 F (36.4 C) (Oral)   Resp 18   Ht 5\' 3"  (1.6 m)   Wt 133 lb 12.8 oz (60.7 kg)   SpO2 97%   BMI 23.70 kg/m  Wt Readings from Last 3 Encounters:  03/27/21 133 lb 12.8 oz (60.7 kg)  11/30/19 148 lb 9.6 oz (67.4 kg)  09/15/19 144 lb 6.4 oz (65.5 kg)     Health Maintenance Due  Topic Date Due   Zoster Vaccines- Shingrix (1 of 2) Never done   TETANUS/TDAP  05/17/2014   COVID-19 Vaccine (3 - Pfizer risk series) 09/05/2019   FOOT EXAM  07/23/2020   OPHTHALMOLOGY EXAM  09/30/2020   HEMOGLOBIN A1C  10/06/2020   INFLUENZA VACCINE  12/05/2020    There are no preventive care reminders to display for this patient.  Lab Results  Component Value Date   TSH 2.48 12/18/2018   Lab Results  Component Value Date   WBC 5.9 06/21/2016   HGB 15.6 (H) 06/21/2016   HCT 46.3 (H) 06/21/2016   MCV 83.9 06/21/2016   PLT 241.0 06/21/2016   Lab Results  Component Value Date   NA 139 04/07/2020   K 4.0 04/07/2020   CO2 23 04/07/2020   GLUCOSE 118 (H) 04/07/2020   BUN 21 04/07/2020   CREATININE 1.00 04/07/2020   BILITOT 1.2 04/07/2020   ALKPHOS 75 04/07/2020   AST 20 04/07/2020   ALT 17 04/07/2020   PROT 7.2 04/07/2020   ALBUMIN 4.6 04/07/2020   CALCIUM 9.8 04/07/2020   GFR 52.10 (L) 04/07/2020   Lab Results  Component Value Date   CHOL 170 04/07/2020   Lab Results  Component Value Date   HDL 73.50 04/07/2020   Lab Results  Component Value Date   LDLCALC 79 04/07/2020   Lab Results  Component Value Date   TRIG 86.0 04/07/2020   Lab Results  Component Value Date   CHOLHDL 2 04/07/2020   Lab Results  Component Value Date   HGBA1C 6.2 04/07/2020      Assessment & Plan:   Problem List Items Addressed This Visit       Unprioritized    Essential hypertension   Relevant Medications   amLODipine (NORVASC) 2.5 MG tablet   Other Relevant Orders   Hemoglobin A1c   Comprehensive metabolic panel   Lipid panel   Muscle spasm -  Primary    Muscle relaxer prn  Heat / ice  F/u ortho      Relevant Medications   methocarbamol (ROBAXIN) 500 MG tablet   Other Visit Diagnoses     Hyperglycemia       Relevant Orders   Hemoglobin A1c   Comprehensive metabolic panel   Lipid panel   Need for influenza vaccination       Relevant Orders   Flu Vaccine QUAD High Dose(Fluad)       Meds ordered this encounter  Medications   amLODipine (NORVASC) 2.5 MG tablet    Sig: TAKE 1 TABLET(2.5 MG) BY MOUTH DAILY    Dispense:  90 tablet    Refill:  2   methocarbamol (ROBAXIN) 500 MG tablet    Sig: Take 1 tablet (500 mg total) by mouth every 6 (six) hours as needed for muscle spasms.    Dispense:  45 tablet    Refill:  1    Follow-up: Return in about 6 months (around 09/24/2021), or if symptoms worsen or fail to improve, for hypertension, hyperlipidemia.    Ann Held, DO

## 2021-03-29 NOTE — Progress Notes (Signed)
Letter mailed

## 2021-04-03 ENCOUNTER — Other Ambulatory Visit: Payer: Self-pay | Admitting: Family Medicine

## 2021-04-03 DIAGNOSIS — I10 Essential (primary) hypertension: Secondary | ICD-10-CM

## 2021-04-19 DIAGNOSIS — R270 Ataxia, unspecified: Secondary | ICD-10-CM | POA: Diagnosis not present

## 2021-04-19 DIAGNOSIS — M79604 Pain in right leg: Secondary | ICD-10-CM | POA: Diagnosis not present

## 2021-04-19 DIAGNOSIS — I739 Peripheral vascular disease, unspecified: Secondary | ICD-10-CM | POA: Diagnosis not present

## 2021-04-19 DIAGNOSIS — M5416 Radiculopathy, lumbar region: Secondary | ICD-10-CM | POA: Diagnosis not present

## 2021-04-19 DIAGNOSIS — M7989 Other specified soft tissue disorders: Secondary | ICD-10-CM | POA: Diagnosis not present

## 2021-04-21 DIAGNOSIS — I739 Peripheral vascular disease, unspecified: Secondary | ICD-10-CM | POA: Diagnosis not present

## 2021-04-21 DIAGNOSIS — M7989 Other specified soft tissue disorders: Secondary | ICD-10-CM | POA: Diagnosis not present

## 2021-04-21 DIAGNOSIS — M79604 Pain in right leg: Secondary | ICD-10-CM | POA: Diagnosis not present

## 2021-04-24 DIAGNOSIS — R600 Localized edema: Secondary | ICD-10-CM | POA: Diagnosis not present

## 2021-04-24 DIAGNOSIS — M79604 Pain in right leg: Secondary | ICD-10-CM | POA: Diagnosis not present

## 2021-04-24 DIAGNOSIS — I739 Peripheral vascular disease, unspecified: Secondary | ICD-10-CM | POA: Diagnosis not present

## 2021-04-24 DIAGNOSIS — M7989 Other specified soft tissue disorders: Secondary | ICD-10-CM | POA: Diagnosis not present

## 2021-05-11 DIAGNOSIS — M5124 Other intervertebral disc displacement, thoracic region: Secondary | ICD-10-CM | POA: Diagnosis not present

## 2021-05-11 DIAGNOSIS — M4802 Spinal stenosis, cervical region: Secondary | ICD-10-CM | POA: Diagnosis not present

## 2021-05-11 DIAGNOSIS — M47816 Spondylosis without myelopathy or radiculopathy, lumbar region: Secondary | ICD-10-CM | POA: Diagnosis not present

## 2021-05-11 DIAGNOSIS — M5137 Other intervertebral disc degeneration, lumbosacral region: Secondary | ICD-10-CM | POA: Diagnosis not present

## 2021-05-11 DIAGNOSIS — M5117 Intervertebral disc disorders with radiculopathy, lumbosacral region: Secondary | ICD-10-CM | POA: Diagnosis not present

## 2021-05-11 DIAGNOSIS — M4726 Other spondylosis with radiculopathy, lumbar region: Secondary | ICD-10-CM | POA: Diagnosis not present

## 2021-05-11 DIAGNOSIS — M2578 Osteophyte, vertebrae: Secondary | ICD-10-CM | POA: Diagnosis not present

## 2021-05-11 DIAGNOSIS — M5126 Other intervertebral disc displacement, lumbar region: Secondary | ICD-10-CM | POA: Diagnosis not present

## 2021-05-14 ENCOUNTER — Other Ambulatory Visit: Payer: Self-pay | Admitting: Family Medicine

## 2021-05-14 DIAGNOSIS — I10 Essential (primary) hypertension: Secondary | ICD-10-CM

## 2021-05-31 DIAGNOSIS — M25552 Pain in left hip: Secondary | ICD-10-CM | POA: Diagnosis not present

## 2021-05-31 DIAGNOSIS — M25551 Pain in right hip: Secondary | ICD-10-CM | POA: Diagnosis not present

## 2021-05-31 DIAGNOSIS — M160 Bilateral primary osteoarthritis of hip: Secondary | ICD-10-CM | POA: Diagnosis not present

## 2021-06-02 ENCOUNTER — Telehealth: Payer: Self-pay | Admitting: Family Medicine

## 2021-06-02 NOTE — Telephone Encounter (Signed)
Pt needs a office visit  

## 2021-06-02 NOTE — Telephone Encounter (Signed)
Pt stated she has a small sore on her tailbone and is needing lotion to help. Please advise.

## 2021-06-05 ENCOUNTER — Telehealth: Payer: Self-pay

## 2021-06-05 ENCOUNTER — Ambulatory Visit (INDEPENDENT_AMBULATORY_CARE_PROVIDER_SITE_OTHER): Payer: Medicare Other | Admitting: Family Medicine

## 2021-06-05 VITALS — BP 170/80 | HR 73 | Temp 98.0°F | Resp 18 | Wt 145.0 lb

## 2021-06-05 DIAGNOSIS — L89152 Pressure ulcer of sacral region, stage 2: Secondary | ICD-10-CM | POA: Diagnosis not present

## 2021-06-05 NOTE — Progress Notes (Signed)
[place] [facility] [address], [place], [place] [address] [phone]  Date:  06/05/2021   Name:  [name]   DOB:  [birth year]   MRN:  [id number]  PCP:  Ann Held, DO    Chief Complaint: bump on tailbone ([name] pt. Pt says [facility] would not be able to prescribe Rx that outside NP suggested until being seen.)   History of Present Illness:  [name] is a 87 y.o. very pleasant female patient who presents with the following:  Primary patient of Dr Etter Sjogren, here today with concern of a bump over her tailbone History of DM, hyperlipidemia  Lab Results  Component Value Date   HGBA1C 6.2 03/27/2021   She has noted a little spot on her talibone- it seemed to come and go for a few months.  It is tender some of the time She has tried some different topical agents on the spot   She is having some pain from her hips - she has end stage OA and is facing a hip replacement Her spine NP looked at it last week and they were concerned - they wanted [place] to rx a "lotion" for it per notes?  No drainage from the area Sometimes it seems "crusty" - she will treat with warm water  Pt is not currently driving She gets around with some difficulty, needs a walker to ambulate Her husband died 3 weeks ago   Patient Active Problem List   Diagnosis Date Noted   Muscle spasm 03/27/2021   Hyperlipidemia associated with type 2 diabetes mellitus ([place]) 11/21/2017   Seasonal allergies 11/21/2017   Hypokalemia 08/03/2010   Situational mixed anxiety and depressive disorder 08/03/2010   Hyperlipidemia 11/01/2006   THYROID NODULE, HX OF 11/01/2006   Type 2 diabetes mellitus with complication, without long-term current use of insulin ([place]) 09/24/2006   Essential hypertension 09/24/2006   ABSCESS, RECTUM 09/24/2006   FATTY LIVER DISEASE 09/24/2006   LIVER FUNCTION TESTS, ABNORMAL 09/24/2006   FOOT INJURY 09/24/2006    HEMORRHOIDECTOMY, HX OF 09/24/2006   TONSILLECTOMY AND ADENOIDECTOMY, HX OF 03/17/2001    Past Medical History:  Diagnosis Date   Abscess, rectum    Diabetes mellitus    type 2   Fatty liver disease, nonalcoholic    Hyperlipidemia    Hypertension    Thyroid nodule     Past Surgical History:  Procedure Laterality Date   ABDOMINAL HYSTERECTOMY     ANAL FISSURE REPAIR     CATARACT EXTRACTION, BILATERAL  Feb   EYE SURGERY  06/2013   cataract b/l   HEMORRHOID SURGERY     TONSILLECTOMY AND ADENOIDECTOMY      Social History   Tobacco Use   Smoking status: Never   Smokeless tobacco: Never  Substance Use Topics   Alcohol use: No   Drug use: No    Family History  Problem Relation Age of Onset   Other Brother        Rhabdo secondary to Statin   Diabetes Brother    Hypertension Brother    Diabetes Mother    Hypertension Mother    Hypertension Sister    Mental retardation Sister    Diabetes Unknown    Hypertension Unknown     Allergies  Allergen Reactions   Sulfonamide Derivatives Other (See Comments)    headache    Medication list has been reviewed and updated.  Current Outpatient  Medications on File Prior to Visit  Medication Sig Dispense Refill   amLODipine (NORVASC) 2.5 MG tablet TAKE 1 TABLET(2.5 MG) BY MOUTH DAILY 90 tablet 2   aspirin 81 MG tablet Take 81 mg by mouth daily.       Calcium-Vitamin D-Vitamin K [id number] MG-UNT-MCG CHEW Chew 2 each by mouth daily.     diazepam (VALIUM) 5 MG tablet TAKE 1 TABLET(5 MG) BY MOUTH DAILY 30 tablet 0   diclofenac (VOLTAREN) 75 MG EC tablet Take by mouth.     fluticasone (FLONASE) 50 MCG/ACT nasal spray SHAKE LIQUID AND USE 2 SPRAYS IN EACH NOSTRIL DAILY 16 g 5   methocarbamol (ROBAXIN) 500 MG tablet Take 1 tablet (500 mg total) by mouth every 6 (six) hours as needed for muscle spasms. 45 tablet 1   metoprolol succinate (TOPROL-XL) 100 MG 24 hr tablet TAKE 1 TABLET(100 MG) BY MOUTH DAILY WITH OR IMMEDIATELY FOLLOWING  A MEAL 90 tablet 1   olmesartan (BENICAR) 40 MG tablet TAKE 1 TABLET(40 MG) BY MOUTH DAILY 90 tablet 1   potassium chloride SA (KLOR-CON) 20 MEQ tablet TAKE 1 TABLET(20 MEQ) BY MOUTH DAILY 90 tablet 3   simvastatin (ZOCOR) 20 MG tablet TAKE 1 TABLET(20 MG) BY MOUTH DAILY 90 tablet 0   triamterene-hydrochlorothiazide (MAXZIDE) 75-50 MG tablet Take 0.5 tablets by mouth daily. 45 tablet 2   No current facility-administered medications on file prior to visit.    Review of Systems:  As per HPI- otherwise negative. BP Readings from Last 3 Encounters:  06/05/21 (!) 170/80  03/27/21 136/90  11/30/19 (!) 130/60     Physical Examination: Vitals:   06/05/21 1419  BP: (!) 170/80  Pulse: 73  Resp: 18  Temp: 98 F (36.7 C)  SpO2: 98%   Vitals:   06/05/21 1419  Weight: 145 lb (65.8 kg)   Body mass index is 25.69 kg/m. Ideal Body Weight:    GEN: no acute distress. HEENT: Atraumatic, Normocephalic.  Ears and Nose: No external deformity. CV: RRR, No M/G/R. No JVD. No thrill. No extra heart sounds. PULM: CTA B, no wheezes, crackles, rhonchi. No retractions. No resp. distress. No accessory muscle use. EXTR: No c/c/e PSYCH: Normally interactive. Conversant.  Severe kyphosis, slow gait Pt has a midline sacral decub ulcer, stage 2, approx 8 mm in diameter   She declines to have her repeat her BP today - feels high due to nerves  Assessment and Plan: Pressure injury of sacral region, stage 2 ([place]) - Plan: Ambulatory referral to [facility] seen today with small sacral decub ulcer.  Pt is not able to treat it herself due to location and physical debility Ordered home health to do wound care for her Suggested that she have one of her kids order duoderm dressings for her to her home Suggested a foam cushion for her favorite chair at home She is not WC bound  Follow-up here depending on her healing progress   Signed Lamar Blinks, MD

## 2021-06-05 NOTE — Telephone Encounter (Signed)
Pt was in to see Dr Patsy Lager today and on the way out she mentioned that she needed a new handicap form.   Her close friend Jamesetta So will pick up when ready.

## 2021-06-05 NOTE — Patient Instructions (Signed)
It was nice to see you today!  You have an early stage pressure ulcer on your behind- it is not serious right now but we need to aggressively get it healed so it does not become a more serious issue I have asked home health to come out to your home to help treat this.   A foam seat cushion may be helpful  You might also ask someone in your family to order DuoDERM CGF dressings for wound care to use- these can be ordered from [facility]

## 2021-06-06 ENCOUNTER — Ambulatory Visit: Payer: Medicare Other | Admitting: Family Medicine

## 2021-06-06 NOTE — Telephone Encounter (Signed)
Done, placed in folder. I didn't not see a previous one in her chart

## 2021-06-08 ENCOUNTER — Other Ambulatory Visit: Payer: Self-pay | Admitting: Family Medicine

## 2021-06-08 DIAGNOSIS — F411 Generalized anxiety disorder: Secondary | ICD-10-CM

## 2021-06-08 DIAGNOSIS — I10 Essential (primary) hypertension: Secondary | ICD-10-CM

## 2021-06-08 NOTE — Telephone Encounter (Signed)
Pt called. Phone continued ring. VM did not pick up. Placard placed at the front for pick up.

## 2021-06-09 NOTE — Telephone Encounter (Signed)
Requesting:diazepam Contract:08/06/2019 IDP:[id number] Last Visit:06/05/21 Next Visit:none Last Refill:03/21/21  Please Advise

## 2021-06-12 DIAGNOSIS — E118 Type 2 diabetes mellitus with unspecified complications: Secondary | ICD-10-CM | POA: Diagnosis not present

## 2021-06-12 DIAGNOSIS — M1611 Unilateral primary osteoarthritis, right hip: Secondary | ICD-10-CM | POA: Diagnosis not present

## 2021-06-17 ENCOUNTER — Other Ambulatory Visit: Payer: Self-pay | Admitting: Family Medicine

## 2021-06-17 DIAGNOSIS — E785 Hyperlipidemia, unspecified: Secondary | ICD-10-CM

## 2021-06-28 ENCOUNTER — Telehealth: Payer: Self-pay | Admitting: Family Medicine

## 2021-06-28 NOTE — Telephone Encounter (Signed)
[name] [facility] called regarding verbal order for pt.   Pt was suppose to be discharged today, however facility does not feel comfortable discharging pt.   Juliann Pulse would like verbal orders for skill nurse frequency for one week.   Please advise.  [phone]

## 2021-06-28 NOTE — Telephone Encounter (Signed)
Verbal given 

## 2021-07-04 ENCOUNTER — Other Ambulatory Visit: Payer: Self-pay | Admitting: Family Medicine

## 2021-07-04 DIAGNOSIS — I10 Essential (primary) hypertension: Secondary | ICD-10-CM

## 2021-07-10 DIAGNOSIS — M1611 Unilateral primary osteoarthritis, right hip: Secondary | ICD-10-CM | POA: Diagnosis not present

## 2021-07-10 DIAGNOSIS — Z01810 Encounter for preprocedural cardiovascular examination: Secondary | ICD-10-CM | POA: Diagnosis not present

## 2021-07-10 DIAGNOSIS — I491 Atrial premature depolarization: Secondary | ICD-10-CM | POA: Diagnosis not present

## 2021-07-13 DIAGNOSIS — D649 Anemia, unspecified: Secondary | ICD-10-CM | POA: Diagnosis present

## 2021-07-13 DIAGNOSIS — G8918 Other acute postprocedural pain: Secondary | ICD-10-CM | POA: Diagnosis not present

## 2021-07-13 DIAGNOSIS — M1612 Unilateral primary osteoarthritis, left hip: Secondary | ICD-10-CM | POA: Diagnosis not present

## 2021-07-13 DIAGNOSIS — M1611 Unilateral primary osteoarthritis, right hip: Secondary | ICD-10-CM | POA: Diagnosis present

## 2021-07-13 DIAGNOSIS — E876 Hypokalemia: Secondary | ICD-10-CM | POA: Diagnosis present

## 2021-07-13 DIAGNOSIS — Z7982 Long term (current) use of aspirin: Secondary | ICD-10-CM | POA: Diagnosis not present

## 2021-07-13 DIAGNOSIS — E1165 Type 2 diabetes mellitus with hyperglycemia: Secondary | ICD-10-CM | POA: Diagnosis present

## 2021-07-13 DIAGNOSIS — J302 Other seasonal allergic rhinitis: Secondary | ICD-10-CM | POA: Diagnosis present

## 2021-07-13 DIAGNOSIS — I10 Essential (primary) hypertension: Secondary | ICD-10-CM | POA: Diagnosis present

## 2021-07-13 DIAGNOSIS — Z96641 Presence of right artificial hip joint: Secondary | ICD-10-CM | POA: Diagnosis present

## 2021-07-13 DIAGNOSIS — D62 Acute posthemorrhagic anemia: Secondary | ICD-10-CM | POA: Diagnosis not present

## 2021-07-13 DIAGNOSIS — Z79899 Other long term (current) drug therapy: Secondary | ICD-10-CM | POA: Diagnosis not present

## 2021-07-13 DIAGNOSIS — I952 Hypotension due to drugs: Secondary | ICD-10-CM | POA: Diagnosis present

## 2021-07-13 DIAGNOSIS — T447X5A Adverse effect of beta-adrenoreceptor antagonists, initial encounter: Secondary | ICD-10-CM | POA: Diagnosis present

## 2021-07-13 DIAGNOSIS — Z471 Aftercare following joint replacement surgery: Secondary | ICD-10-CM | POA: Diagnosis not present

## 2021-07-13 DIAGNOSIS — E119 Type 2 diabetes mellitus without complications: Secondary | ICD-10-CM | POA: Diagnosis not present

## 2021-07-13 DIAGNOSIS — Z20822 Contact with and (suspected) exposure to covid-19: Secondary | ICD-10-CM | POA: Diagnosis not present

## 2021-07-13 DIAGNOSIS — R7989 Other specified abnormal findings of blood chemistry: Secondary | ICD-10-CM | POA: Diagnosis not present

## 2021-07-13 DIAGNOSIS — T461X5A Adverse effect of calcium-channel blockers, initial encounter: Secondary | ICD-10-CM | POA: Diagnosis present

## 2021-07-13 DIAGNOSIS — Z741 Need for assistance with personal care: Secondary | ICD-10-CM | POA: Diagnosis present

## 2021-07-13 DIAGNOSIS — E7800 Pure hypercholesterolemia, unspecified: Secondary | ICD-10-CM | POA: Diagnosis present

## 2021-07-13 DIAGNOSIS — Z7409 Other reduced mobility: Secondary | ICD-10-CM | POA: Diagnosis present

## 2021-07-13 DIAGNOSIS — F419 Anxiety disorder, unspecified: Secondary | ICD-10-CM | POA: Diagnosis present

## 2021-07-14 DIAGNOSIS — Z7982 Long term (current) use of aspirin: Secondary | ICD-10-CM | POA: Diagnosis not present

## 2021-07-14 DIAGNOSIS — E1165 Type 2 diabetes mellitus with hyperglycemia: Secondary | ICD-10-CM | POA: Diagnosis present

## 2021-07-14 DIAGNOSIS — Z471 Aftercare following joint replacement surgery: Secondary | ICD-10-CM | POA: Diagnosis not present

## 2021-07-14 DIAGNOSIS — M1611 Unilateral primary osteoarthritis, right hip: Secondary | ICD-10-CM | POA: Diagnosis present

## 2021-07-14 DIAGNOSIS — D62 Acute posthemorrhagic anemia: Secondary | ICD-10-CM | POA: Diagnosis not present

## 2021-07-14 DIAGNOSIS — E119 Type 2 diabetes mellitus without complications: Secondary | ICD-10-CM | POA: Diagnosis not present

## 2021-07-14 DIAGNOSIS — Z20822 Contact with and (suspected) exposure to covid-19: Secondary | ICD-10-CM | POA: Diagnosis not present

## 2021-07-14 DIAGNOSIS — J302 Other seasonal allergic rhinitis: Secondary | ICD-10-CM | POA: Diagnosis present

## 2021-07-14 DIAGNOSIS — T461X5A Adverse effect of calcium-channel blockers, initial encounter: Secondary | ICD-10-CM | POA: Diagnosis present

## 2021-07-14 DIAGNOSIS — I10 Essential (primary) hypertension: Secondary | ICD-10-CM | POA: Diagnosis present

## 2021-07-14 DIAGNOSIS — E876 Hypokalemia: Secondary | ICD-10-CM | POA: Diagnosis present

## 2021-07-14 DIAGNOSIS — R7989 Other specified abnormal findings of blood chemistry: Secondary | ICD-10-CM | POA: Diagnosis not present

## 2021-07-14 DIAGNOSIS — Z7409 Other reduced mobility: Secondary | ICD-10-CM | POA: Diagnosis present

## 2021-07-14 DIAGNOSIS — Z741 Need for assistance with personal care: Secondary | ICD-10-CM | POA: Diagnosis present

## 2021-07-14 DIAGNOSIS — Z79899 Other long term (current) drug therapy: Secondary | ICD-10-CM | POA: Diagnosis not present

## 2021-07-14 DIAGNOSIS — D649 Anemia, unspecified: Secondary | ICD-10-CM | POA: Diagnosis present

## 2021-07-14 DIAGNOSIS — I952 Hypotension due to drugs: Secondary | ICD-10-CM | POA: Diagnosis present

## 2021-07-14 DIAGNOSIS — E7800 Pure hypercholesterolemia, unspecified: Secondary | ICD-10-CM | POA: Diagnosis present

## 2021-07-14 DIAGNOSIS — Z96641 Presence of right artificial hip joint: Secondary | ICD-10-CM | POA: Diagnosis present

## 2021-07-14 DIAGNOSIS — T447X5A Adverse effect of beta-adrenoreceptor antagonists, initial encounter: Secondary | ICD-10-CM | POA: Diagnosis present

## 2021-07-14 DIAGNOSIS — F419 Anxiety disorder, unspecified: Secondary | ICD-10-CM | POA: Diagnosis present

## 2021-07-17 DIAGNOSIS — Z96641 Presence of right artificial hip joint: Secondary | ICD-10-CM | POA: Diagnosis not present

## 2021-07-17 DIAGNOSIS — Z7409 Other reduced mobility: Secondary | ICD-10-CM | POA: Diagnosis not present

## 2021-07-17 DIAGNOSIS — I10 Essential (primary) hypertension: Secondary | ICD-10-CM | POA: Diagnosis not present

## 2021-07-17 DIAGNOSIS — E119 Type 2 diabetes mellitus without complications: Secondary | ICD-10-CM | POA: Diagnosis not present

## 2021-07-17 DIAGNOSIS — Z7982 Long term (current) use of aspirin: Secondary | ICD-10-CM | POA: Diagnosis not present

## 2021-07-17 DIAGNOSIS — D62 Acute posthemorrhagic anemia: Secondary | ICD-10-CM | POA: Diagnosis not present

## 2021-07-18 DIAGNOSIS — D62 Acute posthemorrhagic anemia: Secondary | ICD-10-CM | POA: Diagnosis not present

## 2021-07-18 DIAGNOSIS — Z7982 Long term (current) use of aspirin: Secondary | ICD-10-CM | POA: Diagnosis not present

## 2021-07-18 DIAGNOSIS — E876 Hypokalemia: Secondary | ICD-10-CM | POA: Diagnosis not present

## 2021-07-18 DIAGNOSIS — Z7409 Other reduced mobility: Secondary | ICD-10-CM | POA: Diagnosis not present

## 2021-07-18 DIAGNOSIS — I10 Essential (primary) hypertension: Secondary | ICD-10-CM | POA: Diagnosis not present

## 2021-07-18 DIAGNOSIS — E119 Type 2 diabetes mellitus without complications: Secondary | ICD-10-CM | POA: Diagnosis not present

## 2021-07-18 DIAGNOSIS — Z96641 Presence of right artificial hip joint: Secondary | ICD-10-CM | POA: Diagnosis not present

## 2021-07-19 DIAGNOSIS — E876 Hypokalemia: Secondary | ICD-10-CM | POA: Diagnosis not present

## 2021-07-19 DIAGNOSIS — Z7982 Long term (current) use of aspirin: Secondary | ICD-10-CM | POA: Diagnosis not present

## 2021-07-19 DIAGNOSIS — E119 Type 2 diabetes mellitus without complications: Secondary | ICD-10-CM | POA: Diagnosis not present

## 2021-07-19 DIAGNOSIS — I10 Essential (primary) hypertension: Secondary | ICD-10-CM | POA: Diagnosis not present

## 2021-07-19 DIAGNOSIS — Z96641 Presence of right artificial hip joint: Secondary | ICD-10-CM | POA: Diagnosis not present

## 2021-07-19 DIAGNOSIS — Z7409 Other reduced mobility: Secondary | ICD-10-CM | POA: Diagnosis not present

## 2021-07-19 DIAGNOSIS — D62 Acute posthemorrhagic anemia: Secondary | ICD-10-CM | POA: Diagnosis not present

## 2021-07-20 DIAGNOSIS — Z7982 Long term (current) use of aspirin: Secondary | ICD-10-CM | POA: Diagnosis not present

## 2021-07-20 DIAGNOSIS — D62 Acute posthemorrhagic anemia: Secondary | ICD-10-CM | POA: Diagnosis not present

## 2021-07-20 DIAGNOSIS — I10 Essential (primary) hypertension: Secondary | ICD-10-CM | POA: Diagnosis not present

## 2021-07-20 DIAGNOSIS — Z7409 Other reduced mobility: Secondary | ICD-10-CM | POA: Diagnosis not present

## 2021-07-20 DIAGNOSIS — E876 Hypokalemia: Secondary | ICD-10-CM | POA: Diagnosis not present

## 2021-07-20 DIAGNOSIS — Z96641 Presence of right artificial hip joint: Secondary | ICD-10-CM | POA: Diagnosis not present

## 2021-07-20 DIAGNOSIS — E119 Type 2 diabetes mellitus without complications: Secondary | ICD-10-CM | POA: Diagnosis not present

## 2021-07-21 DIAGNOSIS — Z7982 Long term (current) use of aspirin: Secondary | ICD-10-CM | POA: Diagnosis not present

## 2021-07-21 DIAGNOSIS — Z96641 Presence of right artificial hip joint: Secondary | ICD-10-CM | POA: Diagnosis not present

## 2021-07-21 DIAGNOSIS — Z7409 Other reduced mobility: Secondary | ICD-10-CM | POA: Diagnosis not present

## 2021-07-21 DIAGNOSIS — D62 Acute posthemorrhagic anemia: Secondary | ICD-10-CM | POA: Diagnosis not present

## 2021-07-21 DIAGNOSIS — E119 Type 2 diabetes mellitus without complications: Secondary | ICD-10-CM | POA: Diagnosis not present

## 2021-07-21 DIAGNOSIS — I10 Essential (primary) hypertension: Secondary | ICD-10-CM | POA: Diagnosis not present

## 2021-07-21 DIAGNOSIS — E876 Hypokalemia: Secondary | ICD-10-CM | POA: Diagnosis not present

## 2021-07-24 DIAGNOSIS — Z7982 Long term (current) use of aspirin: Secondary | ICD-10-CM | POA: Diagnosis not present

## 2021-07-24 DIAGNOSIS — Z7409 Other reduced mobility: Secondary | ICD-10-CM | POA: Diagnosis not present

## 2021-07-24 DIAGNOSIS — E119 Type 2 diabetes mellitus without complications: Secondary | ICD-10-CM | POA: Diagnosis not present

## 2021-07-24 DIAGNOSIS — Z96641 Presence of right artificial hip joint: Secondary | ICD-10-CM | POA: Diagnosis not present

## 2021-07-24 DIAGNOSIS — I10 Essential (primary) hypertension: Secondary | ICD-10-CM | POA: Diagnosis not present

## 2021-07-24 DIAGNOSIS — D62 Acute posthemorrhagic anemia: Secondary | ICD-10-CM | POA: Diagnosis not present

## 2021-07-25 DIAGNOSIS — Z7409 Other reduced mobility: Secondary | ICD-10-CM | POA: Diagnosis not present

## 2021-07-25 DIAGNOSIS — I10 Essential (primary) hypertension: Secondary | ICD-10-CM | POA: Diagnosis not present

## 2021-07-25 DIAGNOSIS — Z96641 Presence of right artificial hip joint: Secondary | ICD-10-CM | POA: Diagnosis not present

## 2021-07-25 DIAGNOSIS — D62 Acute posthemorrhagic anemia: Secondary | ICD-10-CM | POA: Diagnosis not present

## 2021-07-25 DIAGNOSIS — E119 Type 2 diabetes mellitus without complications: Secondary | ICD-10-CM | POA: Diagnosis not present

## 2021-07-25 DIAGNOSIS — Z7982 Long term (current) use of aspirin: Secondary | ICD-10-CM | POA: Diagnosis not present

## 2021-07-27 DIAGNOSIS — Z9181 History of falling: Secondary | ICD-10-CM | POA: Diagnosis not present

## 2021-07-27 DIAGNOSIS — F419 Anxiety disorder, unspecified: Secondary | ICD-10-CM | POA: Diagnosis not present

## 2021-07-27 DIAGNOSIS — Z471 Aftercare following joint replacement surgery: Secondary | ICD-10-CM | POA: Diagnosis not present

## 2021-07-27 DIAGNOSIS — Z7982 Long term (current) use of aspirin: Secondary | ICD-10-CM | POA: Diagnosis not present

## 2021-07-27 DIAGNOSIS — Z96641 Presence of right artificial hip joint: Secondary | ICD-10-CM | POA: Diagnosis not present

## 2021-07-27 DIAGNOSIS — M1990 Unspecified osteoarthritis, unspecified site: Secondary | ICD-10-CM | POA: Diagnosis not present

## 2021-07-27 DIAGNOSIS — J301 Allergic rhinitis due to pollen: Secondary | ICD-10-CM | POA: Diagnosis not present

## 2021-07-27 DIAGNOSIS — M810 Age-related osteoporosis without current pathological fracture: Secondary | ICD-10-CM | POA: Diagnosis not present

## 2021-07-27 DIAGNOSIS — I10 Essential (primary) hypertension: Secondary | ICD-10-CM | POA: Diagnosis not present

## 2021-07-27 DIAGNOSIS — D62 Acute posthemorrhagic anemia: Secondary | ICD-10-CM | POA: Diagnosis not present

## 2021-07-27 DIAGNOSIS — E119 Type 2 diabetes mellitus without complications: Secondary | ICD-10-CM | POA: Diagnosis not present

## 2021-07-27 DIAGNOSIS — E7800 Pure hypercholesterolemia, unspecified: Secondary | ICD-10-CM | POA: Diagnosis not present

## 2021-07-29 ENCOUNTER — Other Ambulatory Visit: Payer: Self-pay | Admitting: Family Medicine

## 2021-07-29 DIAGNOSIS — I10 Essential (primary) hypertension: Secondary | ICD-10-CM

## 2021-07-31 ENCOUNTER — Ambulatory Visit (INDEPENDENT_AMBULATORY_CARE_PROVIDER_SITE_OTHER): Payer: Medicare Other | Admitting: Family Medicine

## 2021-07-31 ENCOUNTER — Other Ambulatory Visit: Payer: Self-pay | Admitting: Family Medicine

## 2021-07-31 ENCOUNTER — Encounter: Payer: Self-pay | Admitting: Family Medicine

## 2021-07-31 VITALS — BP 160/80 | HR 77 | Temp 98.4°F | Resp 16 | Ht 63.0 in | Wt 145.0 lb

## 2021-07-31 DIAGNOSIS — I10 Essential (primary) hypertension: Secondary | ICD-10-CM | POA: Diagnosis not present

## 2021-07-31 DIAGNOSIS — E1169 Type 2 diabetes mellitus with other specified complication: Secondary | ICD-10-CM

## 2021-07-31 DIAGNOSIS — Z96641 Presence of right artificial hip joint: Secondary | ICD-10-CM | POA: Insufficient documentation

## 2021-07-31 DIAGNOSIS — E785 Hyperlipidemia, unspecified: Secondary | ICD-10-CM

## 2021-07-31 LAB — BASIC METABOLIC PANEL
BUN: 17 mg/dL (ref 6–23)
CO2: 28 mEq/L (ref 19–32)
Calcium: 9 mg/dL (ref 8.4–10.5)
Chloride: 103 mEq/L (ref 96–112)
Creatinine, Ser: 0.78 mg/dL (ref 0.40–1.20)
GFR: 69.55 mL/min (ref >60.00)
Glucose, Bld: 100 mg/dL — ABNORMAL HIGH (ref 70–99)
Potassium: 3.7 mEq/L (ref 3.5–5.1)
Sodium: 141 mEq/L (ref 135–145)

## 2021-07-31 MED ORDER — OLMESARTAN MEDOXOMIL 40 MG PO TABS: ORAL_TABLET | ORAL | 1 refills | Status: DC

## 2021-07-31 MED ORDER — METOPROLOL SUCCINATE ER 100 MG PO TB24: ORAL_TABLET | ORAL | 1 refills | Status: DC

## 2021-07-31 NOTE — Assessment & Plan Note (Signed)
Lab Results  ?Component Value Date  ? CHOL 159 03/27/2021  ? HDL 63.80 03/27/2021  ? LDLCALC 79 03/27/2021  ? TRIG 82.0 03/27/2021  ? CHOLHDL 2 03/27/2021  ? ?

## 2021-07-31 NOTE — Assessment & Plan Note (Signed)
Per ortho ?Home pt  ?

## 2021-07-31 NOTE — Progress Notes (Signed)
? ?Subjective:  ? ?By signing my name below, I, Cassell Clement, attest that this documentation has been prepared under the direction and in the presence of Seabron Spates R DO 07/31/2021 ? ? Patient ID: [name], female    DOB: 1934/10/12, 87 y.o.   MRN: [id number] ? ?Chief Complaint  ?Patient presents with  ? Follow-up  ?  Here for Follow Up   ? ? ?HPI ?Patient is in today for a hospital follow up. ? ?Patient was admitted to the ED on 07/13/2021 for anterior right total hip replacement for right hip OA. Patient was then admitted for physical therapy. She was discharged from the ED on 07/25/2021. She denies of any pain. ? ?She requests a refill of 40 MG Olmesartan and 100 MG of Metoprolol Succinate. ? ?During her time in the hospital she was given 25 MG of Metoprolol . She started taking 100 MG of Metoprolol Succinate on 07/27/2021. ? ?She was taking 2.5 MG of Amlodpinine while being in the ED.  ? ?BP Readings from Last 3 Encounters:  ?07/31/21 (!) 160/80  ?06/05/21 (!) 170/80  ?03/27/21 136/90  ? ? ?She is currently taking 2 aspirins until 08/24/2021 ? ?Her physical therapy has been going well.  ? ?Husband passed away recently so she has not been consistently keeping up with medications.  ? ? ? ?Past Medical History:  ?Diagnosis Date  ? Abscess, rectum   ? Diabetes mellitus   ? type 2  ? Fatty liver disease, nonalcoholic   ? Hyperlipidemia   ? Hypertension   ? Thyroid nodule   ? ? ?Past Surgical History:  ?Procedure Laterality Date  ? ABDOMINAL HYSTERECTOMY    ? ANAL FISSURE REPAIR    ? CATARACT EXTRACTION, BILATERAL  Feb  ? EYE SURGERY  06/2013  ? cataract b/l  ? HEMORRHOID SURGERY    ? TONSILLECTOMY AND ADENOIDECTOMY    ? ? ?Family History  ?Problem Relation Age of Onset  ? Other Brother   ?     Rhabdo secondary to Statin  ? Diabetes Brother   ? Hypertension Brother   ? Diabetes Mother   ? Hypertension Mother   ? Hypertension Sister   ? Mental retardation Sister   ? Diabetes Unknown   ? Hypertension  Unknown   ? ? ?Social History  ? ?Socioeconomic History  ? Marital status: Married  ?  Spouse name: Not on file  ? Number of children: Not on file  ? Years of education: Not on file  ? Highest education level: Not on file  ?Occupational History  ? Occupation: retired Runner, broadcasting/film/video  ?Tobacco Use  ? Smoking status: Never  ? Smokeless tobacco: Never  ?Substance and Sexual Activity  ? Alcohol use: No  ? Drug use: No  ? Sexual activity: Yes  ?  Partners: Male  ?Other Topics Concern  ? Not on file  ?Social History Narrative  ? Exercise- walks everyday  ? Lives with husband  ? ?Social Determinants of Health  ? ?Financial Resource Strain: Not on file  ?Food Insecurity: Not on file  ?Transportation Needs: Not on file  ?Physical Activity: Not on file  ?Stress: Not on file  ?Social Connections: Not on file  ?Intimate Partner Violence: Not on file  ? ? ?Outpatient Medications Prior to Visit  ?Medication Sig Dispense Refill  ? amLODipine (NORVASC) 2.5 MG tablet TAKE 1 TABLET(2.5 MG) BY MOUTH DAILY 90 tablet 2  ? aspirin 81 MG tablet Take 81 mg by mouth  daily.      ? Calcium-Vitamin D-Vitamin K 500-500-40 MG-UNT-MCG CHEW Chew 2 each by mouth daily.    ? diazepam (VALIUM) 5 MG tablet TAKE 1 TABLET(5 MG) BY MOUTH DAILY 30 tablet 1  ? diclofenac (VOLTAREN) 75 MG EC tablet Take by mouth.    ? fluticasone (FLONASE) 50 MCG/ACT nasal spray SHAKE LIQUID AND USE 2 SPRAYS IN EACH NOSTRIL DAILY 16 g 5  ? methocarbamol (ROBAXIN) 500 MG tablet Take 1 tablet (500 mg total) by mouth every 6 (six) hours as needed for muscle spasms. 45 tablet 1  ? potassium chloride SA (KLOR-CON M) 20 MEQ tablet TAKE 1 TABLET(20 MEQ) BY MOUTH DAILY 90 tablet 1  ? simvastatin (ZOCOR) 20 MG tablet TAKE 1 TABLET(20 MG) BY MOUTH DAILY 90 tablet 0  ? triamterene-hydrochlorothiazide (MAXZIDE) 75-50 MG tablet TAKE 1/2 TABLET BY MOUTH DAILY 45 tablet 2  ? metoprolol succinate (TOPROL-XL) 100 MG 24 hr tablet TAKE 1 TABLET(100 MG) BY MOUTH DAILY WITH OR IMMEDIATELY FOLLOWING A  MEAL 90 tablet 1  ? olmesartan (BENICAR) 40 MG tablet TAKE 1 TABLET(40 MG) BY MOUTH DAILY 90 tablet 1  ? ?No facility-administered medications prior to visit.  ? ? ?Allergies  ?Allergen Reactions  ? Sulfonamide Derivatives Other (See Comments)  ?  headache  ? ? ?Review of Systems  ?Constitutional:  Negative for fever and malaise/fatigue.  ?HENT:  Negative for congestion.   ?Eyes:  Negative for blurred vision.  ?Respiratory:  Negative for shortness of breath.   ?Cardiovascular:  Positive for leg swelling (Right Ankle). Negative for chest pain and palpitations.  ?Gastrointestinal:  Negative for abdominal pain, blood in stool and nausea.  ?Genitourinary:  Negative for dysuria and frequency.  ?Musculoskeletal:  Negative for falls and myalgias.  ?Skin:  Negative for rash.  ?Neurological:  Negative for dizziness, loss of consciousness and headaches.  ?Endo/Heme/Allergies:  Negative for environmental allergies.  ?Psychiatric/Behavioral:  Negative for depression. The patient is not nervous/anxious.   ? ?   ?Objective:  ?  ?Physical Exam ?Vitals and nursing note reviewed.  ?Constitutional:   ?   General: She is not in acute distress. ?   Appearance: Normal appearance. She is well-developed. She is not ill-appearing.  ?HENT:  ?   Head: Normocephalic and atraumatic.  ?   Right Ear: External ear normal.  ?   Left Ear: External ear normal.  ?Eyes:  ?   Extraocular Movements: Extraocular movements intact.  ?   Conjunctiva/sclera: Conjunctivae normal.  ?   Pupils: Pupils are equal, round, and reactive to light.  ?Neck:  ?   Thyroid: No thyromegaly.  ?   Vascular: No carotid bruit or JVD.  ?Cardiovascular:  ?   Rate and Rhythm: Normal rate and regular rhythm.  ?   Heart sounds: Normal heart sounds. No murmur heard. ?  No gallop.  ?Pulmonary:  ?   Effort: Pulmonary effort is normal. No respiratory distress.  ?   Breath sounds: Normal breath sounds. No wheezing or rales.  ?Chest:  ?   Chest wall: No tenderness.  ?Musculoskeletal:  ?    Cervical back: Normal range of motion and neck supple.  ?Skin: ?   General: Skin is warm and dry.  ?Neurological:  ?   Mental Status: She is alert and oriented to person, place, and time.  ?Psychiatric:     ?   Judgment: Judgment normal.  ? ? ?BP (!) 160/80 (BP Location: Right Arm, Patient Position: Sitting, Cuff Size: Normal)  Pulse 77   Temp 98.4 ?F (36.9 ?C) (Oral)   Resp 16   Ht 5\' 3"  (1.6 m)   Wt 145 lb (65.8 kg)   SpO2 98%   BMI 25.69 kg/m?  ?Wt Readings from Last 3 Encounters:  ?07/31/21 145 lb (65.8 kg)  ?06/05/21 145 lb (65.8 kg)  ?03/27/21 133 lb 12.8 oz (60.7 kg)  ? ? ?Diabetic Foot Exam - Simple   ?No data filed ?  ? ?Lab Results  ?Component Value Date  ? WBC 5.9 06/21/2016  ? HGB 15.6 (H) 06/21/2016  ? HCT 46.3 (H) 06/21/2016  ? PLT 241.0 06/21/2016  ? GLUCOSE 113 (H) 03/27/2021  ? CHOL 159 03/27/2021  ? TRIG 82.0 03/27/2021  ? HDL 63.80 03/27/2021  ? LDLCALC 79 03/27/2021  ? ALT 13 03/27/2021  ? AST 17 03/27/2021  ? NA 139 03/27/2021  ? K 3.8 03/27/2021  ? CL 102 03/27/2021  ? CREATININE 0.99 03/27/2021  ? BUN 23 03/27/2021  ? CO2 26 03/27/2021  ? TSH 2.48 12/18/2018  ? INR 1.04 06/02/2010  ? HGBA1C 6.2 03/27/2021  ? MICROALBUR <0.7 07/24/2019  ? ? ?Lab Results  ?Component Value Date  ? TSH 2.48 12/18/2018  ? ?Lab Results  ?Component Value Date  ? WBC 5.9 06/21/2016  ? HGB 15.6 (H) 06/21/2016  ? HCT 46.3 (H) 06/21/2016  ? MCV 83.9 06/21/2016  ? PLT 241.0 06/21/2016  ? ?Lab Results  ?Component Value Date  ? NA 139 03/27/2021  ? K 3.8 03/27/2021  ? CO2 26 03/27/2021  ? GLUCOSE 113 (H) 03/27/2021  ? BUN 23 03/27/2021  ? CREATININE 0.99 03/27/2021  ? BILITOT 0.9 03/27/2021  ? ALKPHOS 94 03/27/2021  ? AST 17 03/27/2021  ? ALT 13 03/27/2021  ? PROT 7.0 03/27/2021  ? ALBUMIN 4.4 03/27/2021  ? CALCIUM 9.8 03/27/2021  ? GFR 52.37 (L) 03/27/2021  ? ?Lab Results  ?Component Value Date  ? CHOL 159 03/27/2021  ? ?Lab Results  ?Component Value Date  ? HDL 63.80 03/27/2021  ? ?Lab Results  ?Component Value  Date  ? LDLCALC 79 03/27/2021  ? ?Lab Results  ?Component Value Date  ? TRIG 82.0 03/27/2021  ? ?Lab Results  ?Component Value Date  ? CHOLHDL 2 03/27/2021  ? ?Lab Results  ?Component Value Date  ? HGBA1C 6.2

## 2021-07-31 NOTE — Patient Instructions (Signed)
Cooking With Less Salt Cooking with less salt is one way to reduce the amount of sodium you get from food. Sodium is one of the elements that make up salt. It is found naturally in foods and is also added to certain foods. Depending on your condition and overall health, your health care provider or dietitian may recommend that you reduce your sodium intake. Most people should have less than 2,300 milligrams (mg) of sodium each day. If you have high blood pressure (hypertension), you may need to limit your sodium to 1,500 mg each day. Follow the tipsbelow to help reduce your sodium intake. What are tips for eating less sodium? Reading food labels  Check the food label before buying or using packaged ingredients. Always check the label for the serving size and sodium content. Look for products with no more than 140 mg of sodium in one serving. Check the % Daily Value column to see what percent of the daily recommended amount of sodium is provided in one serving of the product. Foods with 5% or less in this column are considered low in sodium. Foods with 20% or higher are considered high in sodium. Do not choose foods with salt as one of the first three ingredients on the ingredients list. If salt is one of the first three ingredients, it usually means the item is high in sodium.  Shopping Buy sodium-free or low-sodium products. Look for the following words on food labels: Low-sodium. Sodium-free. Reduced-sodium. No salt added. Unsalted. Always check the sodium content even if foods are labeled as low-sodium or no salt added. Buy fresh foods. Cooking Use herbs, seasonings without salt, and spices as substitutes for salt. Use sodium-free baking soda when baking. Grill, braise, or roast foods to add flavor with less salt. Avoid adding salt to pasta, rice, or hot cereals. Drain and rinse canned vegetables, beans, and meat before use. Avoid adding salt when cooking sweets and desserts. Cook with  low-sodium ingredients. What foods are high in sodium? Vegetables Regular canned vegetables (not low-sodium or reduced-sodium). Sauerkraut, pickled vegetables, and relishes. Olives. French fries. Onion rings. Regular canned tomato sauce and paste. Regular tomato and vegetable juice. Frozenvegetables in sauces. Grains Instant hot cereals. Bread stuffing, pancake, and biscuit mixes. Croutons. Seasoned rice or pasta mixes. Noodle soup cups. Boxed or frozen macaroni and cheese. Regular salted crackers. Self-rising flour. Rolls. Bagels. Flourtortillas and wraps. Meats and other proteins Meat or fish that is salted, canned, smoked, cured, spiced, or pickled. This includes bacon, ham, sausages, hot dogs, corned beef, chipped beef, meat loaves, salt pork, jerky, pickled herring, anchovies, regular canned tuna, andsardines. Salted nuts. Dairy Processed cheese and cheese spreads. Cheese curds. Blue cheese. Feta cheese.String cheese. Regular cottage cheese. Buttermilk. Canned milk. The items listed above may not be a complete list of foods high in sodium. Actual amounts of sodium may be different depending on processing. Contact a dietitian for more information. What foods are low in sodium? Fruits Fresh, frozen, or canned fruit with no sauce added. Fruit juice. Vegetables Fresh or frozen vegetables with no sauce added. "No salt added" canned vegetables. "No salt added" tomato sauce and paste. Low-sodium orreduced-sodium tomato and vegetable juice. Grains Noodles, pasta, quinoa, rice. Shredded or puffed wheat or puffed rice. Regular or quick oats (not instant). Low-sodium crackers. Low-sodium bread. Whole-grainbread and whole-grain pasta. Unsalted popcorn. Meats and other proteins Fresh or frozen whole meats, poultry (not injected with sodium), and fish with no sauce added. Unsalted nuts. Dried peas, beans, and   lentils without added salt. Unsalted canned beans. Eggs. Unsalted nut butters. Low-sodium canned  tunaor chicken. Dairy Milk. Soy milk. Yogurt. Low-sodium cheeses, such as Swiss, Monterey Jack, mozzarella, and ricotta. Sherbet or ice cream (keep to  cup per serving).Cream cheese. Fats and oils Unsalted butter or margarine. Other foods Homemade pudding. Sodium-free baking soda and baking powder. Herbs and spices.Low-sodium seasoning mixes. Beverages Coffee and tea. Carbonated beverages. The items listed above may not be a complete list of foods low in sodium. Actual amounts of sodium may be different depending on processing. Contact a dietitian for more information. What are some salt alternatives when cooking? The following are herbs, seasonings, and spices that can be used instead of salt to flavor your food. Herbs should be fresh or dried. Do not choose packaged mixes. Next to the name of the herb, spice, or seasoning aresome examples of foods you can pair it with. Herbs Bay leaves - Soups, meat and vegetable dishes, and spaghetti sauce. Basil - Italian dishes, soups, pasta, and fish dishes. Cilantro - Meat, poultry, and vegetable dishes. Chili powder - Marinades and Mexican dishes. Chives - Salad dressings and potato dishes. Cumin - Mexican dishes, couscous, and meat dishes. Dill - Fish dishes, sauces, and salads. Fennel - Meat and vegetable dishes, breads, and cookies. Garlic (do not use garlic salt) - Italian dishes, meat dishes, salad dressings, and sauces. Marjoram - Soups, potato dishes, and meat dishes. Oregano - Pizza and spaghetti sauce. Parsley - Salads, soups, pasta, and meat dishes. Rosemary - Italian dishes, salad dressings, soups, and red meats. Saffron - Fish dishes, pasta, and some poultry dishes. Sage - Stuffings and sauces. Tarragon - Fish and poultry dishes. Thyme - Stuffing, meat, and fish dishes. Seasonings Lemon juice - Fish dishes, poultry dishes, vegetables, and salads. Vinegar - Salad dressings, vegetables, and fish dishes. Spices Cinnamon - Sweet  dishes, such as cakes, cookies, and puddings. Cloves - Gingerbread, puddings, and marinades for meats. Curry - Vegetable dishes, fish and poultry dishes, and stir-fry dishes. Ginger - Vegetable dishes, fish dishes, and stir-fry dishes. Nutmeg - Pasta, vegetables, poultry, fish dishes, and custard. Summary Cooking with less salt is one way to reduce the amount of sodium that you get from food. Buy sodium-free or low-sodium products. Check the food label before using or buying packaged ingredients. Use herbs, seasonings without salt, and spices as substitutes for salt in foods. This information is not intended to replace advice given to you by your health care provider. Make sure you discuss any questions you have with your healthcare provider. Document Revised: 04/15/2019 Document Reviewed: 04/15/2019 Elsevier Patient Education  2022 [employer]  

## 2021-07-31 NOTE — Assessment & Plan Note (Signed)
Poorly controlled will alter medications, encouraged DASH diet, minimize caffeine and obtain adequate sleep. Report concerning symptoms and follow up as directed and as needed ?Restart benicar ?con't metoprolol ?con't to hold maxzide and potassium  ?

## 2021-08-01 DIAGNOSIS — Z471 Aftercare following joint replacement surgery: Secondary | ICD-10-CM | POA: Diagnosis not present

## 2021-08-01 DIAGNOSIS — D62 Acute posthemorrhagic anemia: Secondary | ICD-10-CM | POA: Diagnosis not present

## 2021-08-01 DIAGNOSIS — E119 Type 2 diabetes mellitus without complications: Secondary | ICD-10-CM | POA: Diagnosis not present

## 2021-08-01 DIAGNOSIS — I10 Essential (primary) hypertension: Secondary | ICD-10-CM | POA: Diagnosis not present

## 2021-08-01 DIAGNOSIS — M1990 Unspecified osteoarthritis, unspecified site: Secondary | ICD-10-CM | POA: Diagnosis not present

## 2021-08-01 DIAGNOSIS — E7800 Pure hypercholesterolemia, unspecified: Secondary | ICD-10-CM | POA: Diagnosis not present

## 2021-08-03 DIAGNOSIS — E119 Type 2 diabetes mellitus without complications: Secondary | ICD-10-CM | POA: Diagnosis not present

## 2021-08-03 DIAGNOSIS — I10 Essential (primary) hypertension: Secondary | ICD-10-CM | POA: Diagnosis not present

## 2021-08-03 DIAGNOSIS — Z471 Aftercare following joint replacement surgery: Secondary | ICD-10-CM | POA: Diagnosis not present

## 2021-08-03 DIAGNOSIS — E7800 Pure hypercholesterolemia, unspecified: Secondary | ICD-10-CM | POA: Diagnosis not present

## 2021-08-03 DIAGNOSIS — D62 Acute posthemorrhagic anemia: Secondary | ICD-10-CM | POA: Diagnosis not present

## 2021-08-03 DIAGNOSIS — M1990 Unspecified osteoarthritis, unspecified site: Secondary | ICD-10-CM | POA: Diagnosis not present

## 2021-08-07 DIAGNOSIS — Z471 Aftercare following joint replacement surgery: Secondary | ICD-10-CM | POA: Diagnosis not present

## 2021-08-07 DIAGNOSIS — D62 Acute posthemorrhagic anemia: Secondary | ICD-10-CM | POA: Diagnosis not present

## 2021-08-07 DIAGNOSIS — E119 Type 2 diabetes mellitus without complications: Secondary | ICD-10-CM | POA: Diagnosis not present

## 2021-08-07 DIAGNOSIS — M1990 Unspecified osteoarthritis, unspecified site: Secondary | ICD-10-CM | POA: Diagnosis not present

## 2021-08-07 DIAGNOSIS — E7800 Pure hypercholesterolemia, unspecified: Secondary | ICD-10-CM | POA: Diagnosis not present

## 2021-08-07 DIAGNOSIS — I10 Essential (primary) hypertension: Secondary | ICD-10-CM | POA: Diagnosis not present

## 2021-08-10 ENCOUNTER — Telehealth: Payer: Self-pay | Admitting: Family Medicine

## 2021-08-10 DIAGNOSIS — E119 Type 2 diabetes mellitus without complications: Secondary | ICD-10-CM | POA: Diagnosis not present

## 2021-08-10 DIAGNOSIS — Z471 Aftercare following joint replacement surgery: Secondary | ICD-10-CM | POA: Diagnosis not present

## 2021-08-10 DIAGNOSIS — E7800 Pure hypercholesterolemia, unspecified: Secondary | ICD-10-CM | POA: Diagnosis not present

## 2021-08-10 DIAGNOSIS — D62 Acute posthemorrhagic anemia: Secondary | ICD-10-CM | POA: Diagnosis not present

## 2021-08-10 DIAGNOSIS — M1990 Unspecified osteoarthritis, unspecified site: Secondary | ICD-10-CM | POA: Diagnosis not present

## 2021-08-10 DIAGNOSIS — I10 Essential (primary) hypertension: Secondary | ICD-10-CM | POA: Diagnosis not present

## 2021-08-10 NOTE — Telephone Encounter (Signed)
FYI

## 2021-08-10 NOTE — Telephone Encounter (Signed)
Physical therapist from [facility] wanted to make Dr. Laury Axon aware of the patients bp, which was 168/80. He states that the patient told him that the number was her usual reading but he still wanted to let [name] know since it was outside the bp parameters. He advised pt to speak to PCP about the high bps on her next appt. For questions he can be reached at [phone]. ?

## 2021-08-14 ENCOUNTER — Telehealth: Payer: Self-pay

## 2021-08-14 NOTE — Telephone Encounter (Signed)
Pt called in states she was wondering if she should be taking her water pill and potassium or should she stop taking them. ?

## 2021-08-15 DIAGNOSIS — E7800 Pure hypercholesterolemia, unspecified: Secondary | ICD-10-CM | POA: Diagnosis not present

## 2021-08-15 DIAGNOSIS — I10 Essential (primary) hypertension: Secondary | ICD-10-CM | POA: Diagnosis not present

## 2021-08-15 DIAGNOSIS — D62 Acute posthemorrhagic anemia: Secondary | ICD-10-CM | POA: Diagnosis not present

## 2021-08-15 DIAGNOSIS — M1990 Unspecified osteoarthritis, unspecified site: Secondary | ICD-10-CM | POA: Diagnosis not present

## 2021-08-15 DIAGNOSIS — E119 Type 2 diabetes mellitus without complications: Secondary | ICD-10-CM | POA: Diagnosis not present

## 2021-08-15 DIAGNOSIS — Z471 Aftercare following joint replacement surgery: Secondary | ICD-10-CM | POA: Diagnosis not present

## 2021-08-15 NOTE — Telephone Encounter (Signed)
Pt has appt on 08/25/21 ?

## 2021-08-16 DIAGNOSIS — M1990 Unspecified osteoarthritis, unspecified site: Secondary | ICD-10-CM | POA: Diagnosis not present

## 2021-08-16 DIAGNOSIS — E119 Type 2 diabetes mellitus without complications: Secondary | ICD-10-CM | POA: Diagnosis not present

## 2021-08-16 DIAGNOSIS — E7800 Pure hypercholesterolemia, unspecified: Secondary | ICD-10-CM | POA: Diagnosis not present

## 2021-08-16 DIAGNOSIS — I10 Essential (primary) hypertension: Secondary | ICD-10-CM | POA: Diagnosis not present

## 2021-08-16 DIAGNOSIS — Z471 Aftercare following joint replacement surgery: Secondary | ICD-10-CM | POA: Diagnosis not present

## 2021-08-16 DIAGNOSIS — D62 Acute posthemorrhagic anemia: Secondary | ICD-10-CM | POA: Diagnosis not present

## 2021-08-18 DIAGNOSIS — Z96641 Presence of right artificial hip joint: Secondary | ICD-10-CM | POA: Diagnosis not present

## 2021-08-18 DIAGNOSIS — Z471 Aftercare following joint replacement surgery: Secondary | ICD-10-CM | POA: Diagnosis not present

## 2021-08-22 DIAGNOSIS — E7800 Pure hypercholesterolemia, unspecified: Secondary | ICD-10-CM | POA: Diagnosis not present

## 2021-08-22 DIAGNOSIS — I10 Essential (primary) hypertension: Secondary | ICD-10-CM | POA: Diagnosis not present

## 2021-08-22 DIAGNOSIS — D62 Acute posthemorrhagic anemia: Secondary | ICD-10-CM | POA: Diagnosis not present

## 2021-08-22 DIAGNOSIS — M1990 Unspecified osteoarthritis, unspecified site: Secondary | ICD-10-CM | POA: Diagnosis not present

## 2021-08-22 DIAGNOSIS — Z471 Aftercare following joint replacement surgery: Secondary | ICD-10-CM | POA: Diagnosis not present

## 2021-08-22 DIAGNOSIS — E119 Type 2 diabetes mellitus without complications: Secondary | ICD-10-CM | POA: Diagnosis not present

## 2021-08-24 DIAGNOSIS — I10 Essential (primary) hypertension: Secondary | ICD-10-CM | POA: Diagnosis not present

## 2021-08-24 DIAGNOSIS — D62 Acute posthemorrhagic anemia: Secondary | ICD-10-CM | POA: Diagnosis not present

## 2021-08-24 DIAGNOSIS — E119 Type 2 diabetes mellitus without complications: Secondary | ICD-10-CM | POA: Diagnosis not present

## 2021-08-24 DIAGNOSIS — M1990 Unspecified osteoarthritis, unspecified site: Secondary | ICD-10-CM | POA: Diagnosis not present

## 2021-08-24 DIAGNOSIS — E7800 Pure hypercholesterolemia, unspecified: Secondary | ICD-10-CM | POA: Diagnosis not present

## 2021-08-24 DIAGNOSIS — Z471 Aftercare following joint replacement surgery: Secondary | ICD-10-CM | POA: Diagnosis not present

## 2021-08-25 ENCOUNTER — Ambulatory Visit: Payer: Medicare Other | Admitting: Family Medicine

## 2021-08-26 DIAGNOSIS — I10 Essential (primary) hypertension: Secondary | ICD-10-CM | POA: Diagnosis not present

## 2021-08-26 DIAGNOSIS — J301 Allergic rhinitis due to pollen: Secondary | ICD-10-CM | POA: Diagnosis not present

## 2021-08-26 DIAGNOSIS — D62 Acute posthemorrhagic anemia: Secondary | ICD-10-CM | POA: Diagnosis not present

## 2021-08-26 DIAGNOSIS — M1990 Unspecified osteoarthritis, unspecified site: Secondary | ICD-10-CM | POA: Diagnosis not present

## 2021-08-26 DIAGNOSIS — Z96641 Presence of right artificial hip joint: Secondary | ICD-10-CM | POA: Diagnosis not present

## 2021-08-26 DIAGNOSIS — Z471 Aftercare following joint replacement surgery: Secondary | ICD-10-CM | POA: Diagnosis not present

## 2021-08-26 DIAGNOSIS — Z9181 History of falling: Secondary | ICD-10-CM | POA: Diagnosis not present

## 2021-08-26 DIAGNOSIS — F419 Anxiety disorder, unspecified: Secondary | ICD-10-CM | POA: Diagnosis not present

## 2021-08-26 DIAGNOSIS — M810 Age-related osteoporosis without current pathological fracture: Secondary | ICD-10-CM | POA: Diagnosis not present

## 2021-08-26 DIAGNOSIS — Z7982 Long term (current) use of aspirin: Secondary | ICD-10-CM | POA: Diagnosis not present

## 2021-08-26 DIAGNOSIS — E119 Type 2 diabetes mellitus without complications: Secondary | ICD-10-CM | POA: Diagnosis not present

## 2021-08-26 DIAGNOSIS — E7800 Pure hypercholesterolemia, unspecified: Secondary | ICD-10-CM | POA: Diagnosis not present

## 2021-08-28 DIAGNOSIS — E119 Type 2 diabetes mellitus without complications: Secondary | ICD-10-CM | POA: Diagnosis not present

## 2021-08-28 DIAGNOSIS — D62 Acute posthemorrhagic anemia: Secondary | ICD-10-CM | POA: Diagnosis not present

## 2021-08-28 DIAGNOSIS — I10 Essential (primary) hypertension: Secondary | ICD-10-CM | POA: Diagnosis not present

## 2021-08-28 DIAGNOSIS — Z471 Aftercare following joint replacement surgery: Secondary | ICD-10-CM | POA: Diagnosis not present

## 2021-08-28 DIAGNOSIS — M1990 Unspecified osteoarthritis, unspecified site: Secondary | ICD-10-CM | POA: Diagnosis not present

## 2021-08-28 DIAGNOSIS — E7800 Pure hypercholesterolemia, unspecified: Secondary | ICD-10-CM | POA: Diagnosis not present

## 2021-08-30 DIAGNOSIS — E119 Type 2 diabetes mellitus without complications: Secondary | ICD-10-CM | POA: Diagnosis not present

## 2021-08-30 DIAGNOSIS — Z471 Aftercare following joint replacement surgery: Secondary | ICD-10-CM | POA: Diagnosis not present

## 2021-08-30 DIAGNOSIS — D62 Acute posthemorrhagic anemia: Secondary | ICD-10-CM | POA: Diagnosis not present

## 2021-08-30 DIAGNOSIS — M1990 Unspecified osteoarthritis, unspecified site: Secondary | ICD-10-CM | POA: Diagnosis not present

## 2021-08-30 DIAGNOSIS — E7800 Pure hypercholesterolemia, unspecified: Secondary | ICD-10-CM | POA: Diagnosis not present

## 2021-08-30 DIAGNOSIS — I10 Essential (primary) hypertension: Secondary | ICD-10-CM | POA: Diagnosis not present

## 2021-09-05 ENCOUNTER — Telehealth: Payer: Self-pay | Admitting: Family Medicine

## 2021-09-05 DIAGNOSIS — E7800 Pure hypercholesterolemia, unspecified: Secondary | ICD-10-CM | POA: Diagnosis not present

## 2021-09-05 DIAGNOSIS — E119 Type 2 diabetes mellitus without complications: Secondary | ICD-10-CM | POA: Diagnosis not present

## 2021-09-05 DIAGNOSIS — D62 Acute posthemorrhagic anemia: Secondary | ICD-10-CM | POA: Diagnosis not present

## 2021-09-05 DIAGNOSIS — Z471 Aftercare following joint replacement surgery: Secondary | ICD-10-CM | POA: Diagnosis not present

## 2021-09-05 DIAGNOSIS — I10 Essential (primary) hypertension: Secondary | ICD-10-CM | POA: Diagnosis not present

## 2021-09-05 DIAGNOSIS — M1990 Unspecified osteoarthritis, unspecified site: Secondary | ICD-10-CM | POA: Diagnosis not present

## 2021-09-05 NOTE — Telephone Encounter (Signed)
[name] PT called to inform lowne of patients BP ? ?170/90 slight swelling in both legs  ? ?[phone] ?

## 2021-09-05 NOTE — Telephone Encounter (Signed)
Patient has an appointment on 09/07/21 are you ok with that or would you like for her to come in earlier. ? ?Left message on PT machine that we will get pt in. ?

## 2021-09-05 NOTE — Telephone Encounter (Signed)
Spoke with patient and she declines any chest pains or sob.  She states that she has history of high blood pressure and she is ok.  She will await for appt on Thursday for follow up.  I advised that if she does have any chest pains or sob before visit to go to ER immediately and do not wait for appointment. ?

## 2021-09-07 ENCOUNTER — Ambulatory Visit (INDEPENDENT_AMBULATORY_CARE_PROVIDER_SITE_OTHER): Payer: Medicare Other | Admitting: Family Medicine

## 2021-09-07 ENCOUNTER — Encounter: Payer: Self-pay | Admitting: Family Medicine

## 2021-09-07 VITALS — BP 170/110 | HR 75 | Temp 97.8°F | Resp 18 | Ht 63.0 in | Wt 132.4 lb

## 2021-09-07 DIAGNOSIS — I10 Essential (primary) hypertension: Secondary | ICD-10-CM

## 2021-09-07 DIAGNOSIS — F411 Generalized anxiety disorder: Secondary | ICD-10-CM

## 2021-09-07 MED ORDER — TRIAMTERENE-HCTZ 37.5-25 MG PO TABS: 1.0000 | ORAL_TABLET | Freq: Every day | ORAL | 3 refills | Status: DC

## 2021-09-07 MED ORDER — DIAZEPAM 5 MG PO TABS: ORAL_TABLET | ORAL | 1 refills | Status: DC

## 2021-09-07 NOTE — Progress Notes (Signed)
? ?Subjective:  ? ?By signing my name below, I, Shehryar Baig, attest that this documentation has been prepared under the direction and in the presence of Donato SchultzLowne Chase, Ravi Tuccillo R, DO. 09/07/2021 ? ? ? Patient ID: [name], female    DOB: Oct 18, 1934, 87 y.o.   MRN: [id number] ? ?Chief Complaint  ?Patient presents with  ? Hypertension  ? Follow-up  ? ? ?Hypertension ?Pertinent negatives include no blurred vision, chest pain, headaches, malaise/fatigue, palpitations or shortness of breath.  ?Patient is in today for a follow up visit.  ? ?She is requesting a refill on 5 mg diazepam. She reports taking it PRN and reports no new issues while taking it.  ?Her blood pressure is elevated during this visit. Her home therapist measures her blood pressure at home and reports it has been measuring elevated at home. She reports following her last surgical procedure her blood pressure was low. She was taken off of potassium supplements and triamterene immediately following her procedure. She thinks her blood pressure is elevated due to coming off of those medications and she is willing to resume them. She continues taking 100 mg metoprolol succinate daily PO, 40 mg olmesartan daily PO, 2.5 mg amlodipine daily PO and reports no new issues while taking them.  ?BP Readings from Last 3 Encounters:  ?09/07/21 (!) 170/110  ?07/31/21 (!) 160/80  ?06/05/21 (!) 170/80  ? ?Pulse Readings from Last 3 Encounters:  ?09/07/21 75  ?07/31/21 77  ?06/05/21 73  ? ? ?Past Medical History:  ?Diagnosis Date  ? Abscess, rectum   ? Diabetes mellitus   ? type 2  ? Fatty liver disease, nonalcoholic   ? Hyperlipidemia   ? Hypertension   ? Thyroid nodule   ? ? ?Past Surgical History:  ?Procedure Laterality Date  ? ABDOMINAL HYSTERECTOMY    ? ANAL FISSURE REPAIR    ? CATARACT EXTRACTION, BILATERAL  Feb  ? EYE SURGERY  06/2013  ? cataract b/l  ? HEMORRHOID SURGERY    ? TONSILLECTOMY AND ADENOIDECTOMY    ? ? ?Family History  ?Problem Relation Age of  Onset  ? Other Brother   ?     Rhabdo secondary to Statin  ? Diabetes Brother   ? Hypertension Brother   ? Diabetes Mother   ? Hypertension Mother   ? Hypertension Sister   ? Mental retardation Sister   ? Diabetes Unknown   ? Hypertension Unknown   ? ? ?Social History  ? ?Socioeconomic History  ? Marital status: Married  ?  Spouse name: Not on file  ? Number of children: Not on file  ? Years of education: Not on file  ? Highest education level: Not on file  ?Occupational History  ? Occupation: retired Runner, broadcasting/film/videoteacher  ?Tobacco Use  ? Smoking status: Never  ? Smokeless tobacco: Never  ?Substance and Sexual Activity  ? Alcohol use: No  ? Drug use: No  ? Sexual activity: Yes  ?  Partners: Male  ?Other Topics Concern  ? Not on file  ?Social History Narrative  ? Exercise- walks everyday  ? Lives with husband  ? ?Social Determinants of Health  ? ?Financial Resource Strain: Not on file  ?Food Insecurity: Not on file  ?Transportation Needs: Not on file  ?Physical Activity: Not on file  ?Stress: Not on file  ?Social Connections: Not on file  ?Intimate Partner Violence: Not on file  ? ? ?Outpatient Medications Prior to Visit  ?Medication Sig Dispense Refill  ? amLODipine (  NORVASC) 2.5 MG tablet TAKE 1 TABLET(2.5 MG) BY MOUTH DAILY 90 tablet 2  ? aspirin 81 MG tablet Take 81 mg by mouth daily.      ? Calcium-Vitamin D-Vitamin K 500-500-40 MG-UNT-MCG CHEW Chew 2 each by mouth daily.    ? diclofenac (VOLTAREN) 75 MG EC tablet Take by mouth.    ? fluticasone (FLONASE) 50 MCG/ACT nasal spray SHAKE LIQUID AND USE 2 SPRAYS IN EACH NOSTRIL DAILY 16 g 5  ? methocarbamol (ROBAXIN) 500 MG tablet Take 1 tablet (500 mg total) by mouth every 6 (six) hours as needed for muscle spasms. 45 tablet 1  ? metoprolol succinate (TOPROL-XL) 100 MG 24 hr tablet TAKE 1 TABLET BY MOUTH WITH OR IMMEDIATELY FOLLOWING A MEAL 270 tablet 1  ? olmesartan (BENICAR) 40 MG tablet TAKE 1 TABLET(40 MG) BY MOUTH DAILY 90 tablet 1  ? simvastatin (ZOCOR) 20 MG tablet TAKE 1  TABLET(20 MG) BY MOUTH DAILY 90 tablet 0  ? triamterene-hydrochlorothiazide (MAXZIDE) 75-50 MG tablet TAKE 1/2 TABLET BY MOUTH DAILY 45 tablet 2  ? diazepam (VALIUM) 5 MG tablet TAKE 1 TABLET(5 MG) BY MOUTH DAILY 30 tablet 1  ? olmesartan (BENICAR) 40 MG tablet TAKE 1 TABLET(40 MG) BY MOUTH DAILY 90 tablet 1  ? potassium chloride SA (KLOR-CON M) 20 MEQ tablet TAKE 1 TABLET(20 MEQ) BY MOUTH DAILY 90 tablet 1  ? ?No facility-administered medications prior to visit.  ? ? ?Allergies  ?Allergen Reactions  ? Sulfonamide Derivatives Other (See Comments)  ?  headache  ? ? ?Review of Systems  ?Constitutional:  Negative for fever and malaise/fatigue.  ?HENT:  Negative for congestion.   ?Eyes:  Negative for blurred vision.  ?Respiratory:  Negative for shortness of breath.   ?Cardiovascular:  Negative for chest pain, palpitations and leg swelling.  ?Gastrointestinal:  Negative for abdominal pain, blood in stool and nausea.  ?Genitourinary:  Negative for dysuria and frequency.  ?Musculoskeletal:  Negative for falls.  ?Skin:  Negative for rash.  ?Neurological:  Negative for dizziness, loss of consciousness and headaches.  ?Endo/Heme/Allergies:  Negative for environmental allergies.  ?Psychiatric/Behavioral:  Negative for depression. The patient is not nervous/anxious.   ? ?   ?Objective:  ?  ?Physical Exam ?Vitals and nursing note reviewed.  ?Constitutional:   ?   General: She is not in acute distress. ?   Appearance: Normal appearance. She is well-developed. She is not ill-appearing.  ?HENT:  ?   Head: Normocephalic and atraumatic.  ?   Right Ear: External ear normal.  ?   Left Ear: External ear normal.  ?Eyes:  ?   Extraocular Movements: Extraocular movements intact.  ?   Conjunctiva/sclera: Conjunctivae normal.  ?   Pupils: Pupils are equal, round, and reactive to light.  ?Neck:  ?   Thyroid: No thyromegaly.  ?   Vascular: No carotid bruit or JVD.  ?Cardiovascular:  ?   Rate and Rhythm: Normal rate and regular rhythm.  ?    Heart sounds: Normal heart sounds. No murmur heard. ?  No gallop.  ?Pulmonary:  ?   Effort: Pulmonary effort is normal. No respiratory distress.  ?   Breath sounds: Normal breath sounds. No wheezing or rales.  ?Chest:  ?   Chest wall: No tenderness.  ?Musculoskeletal:  ?   Cervical back: Normal range of motion and neck supple.  ?Skin: ?   General: Skin is warm and dry.  ?Neurological:  ?   Mental Status: She is alert and oriented  to person, place, and time.  ?Psychiatric:     ?   Judgment: Judgment normal.  ? ? ?BP (!) 170/110 (BP Location: Right Arm, Patient Position: Sitting, Cuff Size: Normal)   Pulse 75   Temp 97.8 ?F (36.6 ?C) (Oral)   Resp 18   Ht 5\' 3"  (1.6 m)   Wt 132 lb 6.4 oz (60.1 kg)   SpO2 99%   BMI 23.45 kg/m?  ?Wt Readings from Last 3 Encounters:  ?09/07/21 132 lb 6.4 oz (60.1 kg)  ?07/31/21 145 lb (65.8 kg)  ?06/05/21 145 lb (65.8 kg)  ? ? ?Diabetic Foot Exam - Simple   ?No data filed ?  ? ?Lab Results  ?Component Value Date  ? WBC 5.9 06/21/2016  ? HGB 15.6 (H) 06/21/2016  ? HCT 46.3 (H) 06/21/2016  ? PLT 241.0 06/21/2016  ? GLUCOSE 100 (H) 07/31/2021  ? CHOL 159 03/27/2021  ? TRIG 82.0 03/27/2021  ? HDL 63.80 03/27/2021  ? LDLCALC 79 03/27/2021  ? ALT 13 03/27/2021  ? AST 17 03/27/2021  ? NA 141 07/31/2021  ? K 3.7 07/31/2021  ? CL 103 07/31/2021  ? CREATININE 0.78 07/31/2021  ? BUN 17 07/31/2021  ? CO2 28 07/31/2021  ? TSH 2.48 12/18/2018  ? INR 1.04 06/02/2010  ? HGBA1C 6.2 03/27/2021  ? MICROALBUR <0.7 07/24/2019  ? ? ?Lab Results  ?Component Value Date  ? TSH 2.48 12/18/2018  ? ?Lab Results  ?Component Value Date  ? WBC 5.9 06/21/2016  ? HGB 15.6 (H) 06/21/2016  ? HCT 46.3 (H) 06/21/2016  ? MCV 83.9 06/21/2016  ? PLT 241.0 06/21/2016  ? ?Lab Results  ?Component Value Date  ? NA 141 07/31/2021  ? K 3.7 07/31/2021  ? CO2 28 07/31/2021  ? GLUCOSE 100 (H) 07/31/2021  ? BUN 17 07/31/2021  ? CREATININE 0.78 07/31/2021  ? BILITOT 0.9 03/27/2021  ? ALKPHOS 94 03/27/2021  ? AST 17 03/27/2021  ?  ALT 13 03/27/2021  ? PROT 7.0 03/27/2021  ? ALBUMIN 4.4 03/27/2021  ? CALCIUM 9.0 07/31/2021  ? GFR 69.55 07/31/2021  ? ?Lab Results  ?Component Value Date  ? CHOL 159 03/27/2021  ? ?Lab Results  ?Component Value D

## 2021-09-07 NOTE — Assessment & Plan Note (Signed)
Poorly controlled will alter medications, encouraged DASH diet, minimize caffeine and obtain adequate sleep. Report concerning symptoms and follow up as directed and as needed ?Add back maxzide  ?F/u 3 weeks or sooner prn --- will check labs at that time  ?

## 2021-09-07 NOTE — Patient Instructions (Signed)

## 2021-09-08 DIAGNOSIS — I10 Essential (primary) hypertension: Secondary | ICD-10-CM | POA: Diagnosis not present

## 2021-09-08 DIAGNOSIS — E119 Type 2 diabetes mellitus without complications: Secondary | ICD-10-CM | POA: Diagnosis not present

## 2021-09-08 DIAGNOSIS — Z471 Aftercare following joint replacement surgery: Secondary | ICD-10-CM | POA: Diagnosis not present

## 2021-09-08 DIAGNOSIS — E7800 Pure hypercholesterolemia, unspecified: Secondary | ICD-10-CM | POA: Diagnosis not present

## 2021-09-08 DIAGNOSIS — M1990 Unspecified osteoarthritis, unspecified site: Secondary | ICD-10-CM | POA: Diagnosis not present

## 2021-09-08 DIAGNOSIS — D62 Acute posthemorrhagic anemia: Secondary | ICD-10-CM | POA: Diagnosis not present

## 2021-09-12 DIAGNOSIS — E7800 Pure hypercholesterolemia, unspecified: Secondary | ICD-10-CM | POA: Diagnosis not present

## 2021-09-12 DIAGNOSIS — M1990 Unspecified osteoarthritis, unspecified site: Secondary | ICD-10-CM | POA: Diagnosis not present

## 2021-09-12 DIAGNOSIS — E119 Type 2 diabetes mellitus without complications: Secondary | ICD-10-CM | POA: Diagnosis not present

## 2021-09-12 DIAGNOSIS — Z471 Aftercare following joint replacement surgery: Secondary | ICD-10-CM | POA: Diagnosis not present

## 2021-09-12 DIAGNOSIS — I10 Essential (primary) hypertension: Secondary | ICD-10-CM | POA: Diagnosis not present

## 2021-09-12 DIAGNOSIS — D62 Acute posthemorrhagic anemia: Secondary | ICD-10-CM | POA: Diagnosis not present

## 2021-09-14 DIAGNOSIS — Z471 Aftercare following joint replacement surgery: Secondary | ICD-10-CM | POA: Diagnosis not present

## 2021-09-14 DIAGNOSIS — I10 Essential (primary) hypertension: Secondary | ICD-10-CM | POA: Diagnosis not present

## 2021-09-14 DIAGNOSIS — E7800 Pure hypercholesterolemia, unspecified: Secondary | ICD-10-CM | POA: Diagnosis not present

## 2021-09-14 DIAGNOSIS — M1990 Unspecified osteoarthritis, unspecified site: Secondary | ICD-10-CM | POA: Diagnosis not present

## 2021-09-14 DIAGNOSIS — E119 Type 2 diabetes mellitus without complications: Secondary | ICD-10-CM | POA: Diagnosis not present

## 2021-09-14 DIAGNOSIS — D62 Acute posthemorrhagic anemia: Secondary | ICD-10-CM | POA: Diagnosis not present

## 2021-09-20 DIAGNOSIS — M1990 Unspecified osteoarthritis, unspecified site: Secondary | ICD-10-CM | POA: Diagnosis not present

## 2021-09-20 DIAGNOSIS — E119 Type 2 diabetes mellitus without complications: Secondary | ICD-10-CM | POA: Diagnosis not present

## 2021-09-20 DIAGNOSIS — Z471 Aftercare following joint replacement surgery: Secondary | ICD-10-CM | POA: Diagnosis not present

## 2021-09-20 DIAGNOSIS — I10 Essential (primary) hypertension: Secondary | ICD-10-CM | POA: Diagnosis not present

## 2021-09-20 DIAGNOSIS — E7800 Pure hypercholesterolemia, unspecified: Secondary | ICD-10-CM | POA: Diagnosis not present

## 2021-09-20 DIAGNOSIS — D62 Acute posthemorrhagic anemia: Secondary | ICD-10-CM | POA: Diagnosis not present

## 2021-09-23 ENCOUNTER — Other Ambulatory Visit: Payer: Self-pay | Admitting: Family Medicine

## 2021-09-23 DIAGNOSIS — E785 Hyperlipidemia, unspecified: Secondary | ICD-10-CM

## 2021-09-25 ENCOUNTER — Ambulatory Visit: Payer: Medicare Other | Admitting: Family Medicine

## 2021-09-26 ENCOUNTER — Encounter: Payer: Self-pay | Admitting: Family Medicine

## 2021-09-26 ENCOUNTER — Ambulatory Visit (INDEPENDENT_AMBULATORY_CARE_PROVIDER_SITE_OTHER): Payer: Medicare Other | Admitting: Family Medicine

## 2021-09-26 VITALS — BP 124/67 | HR 81 | Temp 97.7°F | Resp 18 | Ht 63.0 in | Wt 128.8 lb

## 2021-09-26 DIAGNOSIS — I10 Essential (primary) hypertension: Secondary | ICD-10-CM | POA: Diagnosis not present

## 2021-09-26 DIAGNOSIS — E785 Hyperlipidemia, unspecified: Secondary | ICD-10-CM | POA: Diagnosis not present

## 2021-09-26 NOTE — Assessment & Plan Note (Signed)
Well controlled, no changes to meds. Encouraged heart healthy diet such as the DASH diet and exercise as tolerated.  °

## 2021-09-26 NOTE — Patient Instructions (Signed)

## 2021-09-26 NOTE — Progress Notes (Signed)
Established Patient Office Visit  Subjective   Patient ID: [name], female    DOB: 29-Jun-1934  Age: 87 y.o. MRN: [id number]  Chief Complaint  Patient presents with   Hypertension   Follow-up    HPI  Patient Active Problem List   Diagnosis Date Noted   Status post right hip replacement 07/31/2021   Muscle spasm 03/27/2021   Hyperlipidemia associated with type 2 diabetes mellitus ([place]) 11/21/2017   Seasonal allergies 11/21/2017   Hypokalemia 08/03/2010   Situational mixed anxiety and depressive disorder 08/03/2010   Hyperlipidemia 11/01/2006   THYROID NODULE, HX OF 11/01/2006   Type 2 diabetes mellitus with complication, without long-term current use of insulin ([place]) 09/24/2006   Essential hypertension 09/24/2006   ABSCESS, RECTUM 09/24/2006   FATTY LIVER DISEASE 09/24/2006   LIVER FUNCTION TESTS, ABNORMAL 09/24/2006   FOOT INJURY 09/24/2006   HEMORRHOIDECTOMY, HX OF 09/24/2006   TONSILLECTOMY AND ADENOIDECTOMY, HX OF 03/17/2001   Past Medical History:  Diagnosis Date   Abscess, rectum    Diabetes mellitus    type 2   Fatty liver disease, nonalcoholic    Hyperlipidemia    Hypertension    Thyroid nodule    Past Surgical History:  Procedure Laterality Date   ABDOMINAL HYSTERECTOMY     ANAL FISSURE REPAIR     CATARACT EXTRACTION, BILATERAL  Feb   EYE SURGERY  06/2013   cataract b/l   HEMORRHOID SURGERY     TONSILLECTOMY AND ADENOIDECTOMY     Social History   Tobacco Use   Smoking status: Never   Smokeless tobacco: Never  Substance Use Topics   Alcohol use: No   Drug use: No   Social History   Socioeconomic History   Marital status: Married    Spouse name: Not on file   Number of children: Not on file   Years of education: Not on file   Highest education level: Not on file  Occupational History   Occupation: retired Pharmacist, hospital  Tobacco Use   Smoking status: Never    Smokeless tobacco: Never  Substance and Sexual Activity   Alcohol use: No   Drug use: No   Sexual activity: Yes    Partners: Male  Other Topics Concern   Not on file  Social History Narrative   Exercise- walks everyday   Lives with husband   Social Determinants of Health   Financial Resource Strain: Not on file  Food Insecurity: Not on file  Transportation Needs: Not on file  Physical Activity: Not on file  Stress: Not on file  Social Connections: Not on file  Intimate Partner Violence: Not on file   Family Status  Relation Name Status   Brother  Alive   Mother  Deceased   Sister  Alive   Father  Deceased   Unknown  (Not Specified)   Unknown  (Not Specified)   Family History  Problem Relation Age of Onset   Other Brother        Rhabdo secondary to Statin   Diabetes Brother    Hypertension Brother    Diabetes Mother    Hypertension Mother    Hypertension Sister    Mental retardation Sister    Diabetes Unknown    Hypertension Unknown  Allergies  Allergen Reactions   Sulfonamide Derivatives Other (See Comments)    headache      Review of Systems  Constitutional:  Negative for fever and malaise/fatigue.  HENT:  Negative for congestion.   Eyes:  Negative for blurred vision.  Respiratory:  Negative for shortness of breath.   Cardiovascular:  Negative for chest pain, palpitations and leg swelling.  Gastrointestinal:  Negative for abdominal pain, blood in stool and nausea.  Genitourinary:  Negative for dysuria and frequency.  Musculoskeletal:  Negative for falls.  Skin:  Negative for rash.  Neurological:  Negative for dizziness, loss of consciousness and headaches.  Endo/Heme/Allergies:  Negative for environmental allergies.  Psychiatric/Behavioral:  Negative for depression. The patient is not nervous/anxious.      Objective:     BP 124/67   Pulse 81   Temp 97.7 F (36.5 C) (Oral)   Resp 18   Ht 5\' 3"  (1.6 m)   Wt 128 lb 12.8 oz (58.4 kg)    SpO2 98%   BMI 22.82 kg/m  BP Readings from Last 3 Encounters:  09/26/21 124/67  09/07/21 (!) 170/110  07/31/21 (!) 160/80   Wt Readings from Last 3 Encounters:  09/26/21 128 lb 12.8 oz (58.4 kg)  09/07/21 132 lb 6.4 oz (60.1 kg)  07/31/21 145 lb (65.8 kg)   SpO2 Readings from Last 3 Encounters:  09/26/21 98%  09/07/21 99%  07/31/21 98%      Physical Exam Vitals and nursing note reviewed.  Constitutional:      Appearance: She is well-developed.  HENT:     Head: Normocephalic and atraumatic.  Eyes:     Conjunctiva/sclera: Conjunctivae normal.  Neck:     Thyroid: No thyromegaly.     Vascular: No carotid bruit or JVD.  Cardiovascular:     Rate and Rhythm: Normal rate and regular rhythm.     Heart sounds: Normal heart sounds. No murmur heard. Pulmonary:     Effort: Pulmonary effort is normal. No respiratory distress.     Breath sounds: Normal breath sounds. No wheezing or rales.  Chest:     Chest wall: No tenderness.  Musculoskeletal:     Cervical back: Normal range of motion and neck supple.  Neurological:     Mental Status: She is alert and oriented to person, place, and time.    No results found for any visits on 09/26/21.  Last CBC Lab Results  Component Value Date   WBC 5.9 06/21/2016   HGB 15.6 (H) 06/21/2016   HCT 46.3 (H) 06/21/2016   MCV 83.9 06/21/2016   MCH 24.9 (L) 06/02/2010   RDW 13.2 06/21/2016   PLT 241.0 [id number]   Last metabolic panel Lab Results  Component Value Date   GLUCOSE 100 (H) 07/31/2021   NA 141 07/31/2021   K 3.7 07/31/2021   CL 103 07/31/2021   CO2 28 07/31/2021   BUN 17 07/31/2021   CREATININE 0.78 07/31/2021   CALCIUM 9.0 07/31/2021   PROT 7.0 03/27/2021   ALBUMIN 4.4 03/27/2021   BILITOT 0.9 03/27/2021   ALKPHOS 94 03/27/2021   AST 17 03/27/2021   ALT 13 03/27/2021   Last lipids Lab Results  Component Value Date   CHOL 159 03/27/2021   HDL 63.80 03/27/2021   LDLCALC 79 03/27/2021   TRIG 82.0 03/27/2021    CHOLHDL 2 03/27/2021   Last thyroid functions Lab Results  Component Value Date   TSH 2.48 12/18/2018   Last vitamin D No results  found for: 25OHVITD2, 25OHVITD3, VD25OH Last vitamin B12 and Folate No results found for: VITAMINB12, FOLATE    The ASCVD Risk score (Arnett DK, et al., 2019) failed to calculate for the following reasons:   The 2019 ASCVD risk score is only valid for ages 56 to 39    Assessment & Plan:   Problem List Items Addressed This Visit       Unprioritized   Hyperlipidemia - Primary   Relevant Orders   CBC with Differential/Platelet   Comprehensive metabolic panel   Lipid panel   Essential hypertension    Well controlled, no changes to meds. Encouraged heart healthy diet such as the DASH diet and exercise as tolerated.        Other Visit Diagnoses     Primary hypertension       Relevant Orders   CBC with Differential/Platelet   Comprehensive metabolic panel   Lipid panel       Return in about 6 months (around 03/29/2022), or if symptoms worsen or fail to improve, for hypertension, hyperlipidemia.    Ann Held, DO

## 2021-09-27 LAB — CBC WITH DIFFERENTIAL/PLATELET
Basophils Absolute: 0.1 10*3/uL (ref 0.0–0.1)
Basophils Relative: 1 % (ref 0.0–3.0)
Eosinophils Absolute: 0.1 10*3/uL (ref 0.0–0.7)
Eosinophils Relative: 1.8 % (ref 0.0–5.0)
HCT: 44.5 % (ref 36.0–46.0)
Hemoglobin: 14.5 g/dL (ref 12.0–15.0)
Lymphocytes Relative: 37 % (ref 12.0–46.0)
Lymphs Abs: 2.1 10*3/uL (ref 0.7–4.0)
MCHC: 32.7 g/dL (ref 30.0–36.0)
MCV: 83 fl (ref 78.0–100.0)
Monocytes Absolute: 0.4 10*3/uL (ref 0.1–1.0)
Monocytes Relative: 7.4 % (ref 3.0–12.0)
Neutro Abs: 3 10*3/uL (ref 1.4–7.7)
Neutrophils Relative %: 52.8 % (ref 43.0–77.0)
Platelets: 269 10*3/uL (ref 150.0–400.0)
RBC: 5.36 Mil/uL — ABNORMAL HIGH (ref 3.87–5.11)
RDW: 14.3 % (ref 11.5–15.5)
WBC: 5.7 10*3/uL (ref 4.0–10.5)

## 2021-09-27 LAB — COMPREHENSIVE METABOLIC PANEL
ALT: 17 U/L (ref 0–35)
AST: 20 U/L (ref 0–37)
Albumin: 4.7 g/dL (ref 3.5–5.2)
Alkaline Phosphatase: 107 U/L (ref 39–117)
BUN: 34 mg/dL — ABNORMAL HIGH (ref 6–23)
CO2: 27 mEq/L (ref 19–32)
Calcium: 10.4 mg/dL (ref 8.4–10.5)
Chloride: 101 mEq/L (ref 96–112)
Creatinine, Ser: 1.15 mg/dL (ref 0.40–1.20)
GFR: 43.6 mL/min — ABNORMAL LOW (ref >60.00)
Glucose, Bld: 105 mg/dL — ABNORMAL HIGH (ref 70–99)
Potassium: 4.2 mEq/L (ref 3.5–5.1)
Sodium: 140 mEq/L (ref 135–145)
Total Bilirubin: 0.7 mg/dL (ref 0.2–1.2)
Total Protein: 7.3 g/dL (ref 6.0–8.3)

## 2021-09-27 LAB — LIPID PANEL
Cholesterol: 193 mg/dL (ref 0–200)
HDL: 78.9 mg/dL (ref >39.00)
LDL Cholesterol: 97 mg/dL (ref 0–99)
NonHDL: 113.72
Total CHOL/HDL Ratio: 2
Triglycerides: 83 mg/dL (ref 0.0–149.0)
VLDL: 16.6 mg/dL (ref 0.0–40.0)

## 2021-10-09 ENCOUNTER — Other Ambulatory Visit: Payer: Self-pay | Admitting: Family Medicine

## 2021-10-27 ENCOUNTER — Other Ambulatory Visit: Payer: Medicare Other

## 2021-10-31 ENCOUNTER — Other Ambulatory Visit: Payer: Self-pay | Admitting: *Deleted

## 2021-10-31 DIAGNOSIS — R944 Abnormal results of kidney function studies: Secondary | ICD-10-CM

## 2021-11-01 ENCOUNTER — Other Ambulatory Visit (INDEPENDENT_AMBULATORY_CARE_PROVIDER_SITE_OTHER): Payer: Medicare Other

## 2021-11-01 DIAGNOSIS — R944 Abnormal results of kidney function studies: Secondary | ICD-10-CM

## 2021-11-01 LAB — COMPREHENSIVE METABOLIC PANEL
ALT: 13 U/L (ref 0–35)
AST: 16 U/L (ref 0–37)
Albumin: 4.3 g/dL (ref 3.5–5.2)
Alkaline Phosphatase: 103 U/L (ref 39–117)
BUN: 28 mg/dL — ABNORMAL HIGH (ref 6–23)
CO2: 26 mEq/L (ref 19–32)
Calcium: 9.6 mg/dL (ref 8.4–10.5)
Chloride: 105 mEq/L (ref 96–112)
Creatinine, Ser: 0.99 mg/dL (ref 0.40–1.20)
GFR: 52.15 mL/min — ABNORMAL LOW (ref >60.00)
Glucose, Bld: 122 mg/dL — ABNORMAL HIGH (ref 70–99)
Potassium: 4.1 mEq/L (ref 3.5–5.1)
Sodium: 141 mEq/L (ref 135–145)
Total Bilirubin: 0.6 mg/dL (ref 0.2–1.2)
Total Protein: 6.8 g/dL (ref 6.0–8.3)

## 2022-01-15 ENCOUNTER — Other Ambulatory Visit: Payer: Self-pay | Admitting: Family Medicine

## 2022-01-15 DIAGNOSIS — F411 Generalized anxiety disorder: Secondary | ICD-10-CM

## 2022-01-15 DIAGNOSIS — I10 Essential (primary) hypertension: Secondary | ICD-10-CM

## 2022-01-15 NOTE — Telephone Encounter (Signed)
Requesting: diazepam 5mg   Contract:07/24/19 UDS: 10/20/12 Last Visit: 09/26/21 Next Visit: None Last Refill: 09/07/21 #30 and 1RF  Please Advise

## 2022-02-01 ENCOUNTER — Ambulatory Visit (INDEPENDENT_AMBULATORY_CARE_PROVIDER_SITE_OTHER): Payer: Medicare Other | Admitting: Family Medicine

## 2022-02-01 ENCOUNTER — Encounter: Payer: Self-pay | Admitting: Family Medicine

## 2022-02-01 VITALS — BP 138/90 | HR 80 | Temp 98.1°F | Resp 18 | Ht 63.0 in | Wt 141.0 lb

## 2022-02-01 DIAGNOSIS — I10 Essential (primary) hypertension: Secondary | ICD-10-CM | POA: Diagnosis not present

## 2022-02-01 DIAGNOSIS — Z23 Encounter for immunization: Secondary | ICD-10-CM

## 2022-02-01 DIAGNOSIS — E785 Hyperlipidemia, unspecified: Secondary | ICD-10-CM

## 2022-02-01 NOTE — Progress Notes (Addendum)
Subjective:   By signing my name below, I, Cassell Clement, attest that this documentation has been prepared under the direction and in the presence of Donato Schultz DO 02/01/2022   Patient ID: [name], female    DOB: October 05, 1934, 87 y.o.   MRN: [id number]  Chief Complaint  Patient presents with   Hypertension   Hyperlipidemia   Follow-up    HPI Patient is in today for an office visit  Her kidney functions are improving. She is drinking more water and is currently taking 40 Mg of Olmesartan Lab Results  Component Value Date   CREATININE 0.99 11/01/2021   BUN 28 (H) 11/01/2021   NA 141 11/01/2021   K 4.1 11/01/2021   CL 105 11/01/2021   CO2 26 11/01/2021   Since starting the 37.5 - 25 Mg of Maxzide-25 the swelling in her lower extremities is improving but she reports that her urine has darken in color  She is interested in receiving an influenza vaccine during today's visit.  Past Medical History:  Diagnosis Date   Abscess, rectum    Diabetes mellitus    type 2   Fatty liver disease, nonalcoholic    Hyperlipidemia    Hypertension    Thyroid nodule     Past Surgical History:  Procedure Laterality Date   ABDOMINAL HYSTERECTOMY     ANAL FISSURE REPAIR     CATARACT EXTRACTION, BILATERAL  Feb   EYE SURGERY  06/2013   cataract b/l   HEMORRHOID SURGERY     TONSILLECTOMY AND ADENOIDECTOMY      Family History  Problem Relation Age of Onset   Other Brother        Rhabdo secondary to Statin   Diabetes Brother    Hypertension Brother    Diabetes Mother    Hypertension Mother    Hypertension Sister    Mental retardation Sister    Diabetes Unknown    Hypertension Unknown     Social History   Socioeconomic History   Marital status: Married    Spouse name: Not on file   Number of children: Not on file   Years of education: Not on file   Highest education level: Not on file  Occupational History   Occupation: retired Runner, broadcasting/film/video  Tobacco Use    Smoking status: Never   Smokeless tobacco: Never  Substance and Sexual Activity   Alcohol use: No   Drug use: No   Sexual activity: Yes    Partners: Male  Other Topics Concern   Not on file  Social History Narrative   Exercise- walks everyday   Lives with husband   Social Determinants of Health   Financial Resource Strain: Not on file  Food Insecurity: Not on file  Transportation Needs: Not on file  Physical Activity: Not on file  Stress: Not on file  Social Connections: Not on file  Intimate Partner Violence: Not on file    Outpatient Medications Prior to Visit  Medication Sig Dispense Refill   amLODipine (NORVASC) 2.5 MG tablet Take 1 tablet (2.5 mg total) by mouth daily. 90 tablet 0   aspirin 81 MG tablet Take 81 mg by mouth daily.       Calcium-Vitamin D-Vitamin K 500-500-40 MG-UNT-MCG CHEW Chew 2 each by mouth daily.     diazepam (VALIUM) 5 MG tablet TAKE 1 TABLET BY MOUTH EVERY DAY AS NEEDED 30 tablet 0   diclofenac (VOLTAREN) 75 MG EC tablet Take by mouth.  fluticasone (FLONASE) 50 MCG/ACT nasal spray SHAKE LIQUID AND USE 2 SPRAYS IN EACH NOSTRIL DAILY 16 g 5   methocarbamol (ROBAXIN) 500 MG tablet Take 1 tablet (500 mg total) by mouth every 6 (six) hours as needed for muscle spasms. 45 tablet 1   metoprolol succinate (TOPROL-XL) 100 MG 24 hr tablet TAKE 1 TABLET BY MOUTH WITH OR IMMEDIATELY FOLLOWING A MEAL 270 tablet 1   olmesartan (BENICAR) 40 MG tablet TAKE 1 TABLET(40 MG) BY MOUTH DAILY 90 tablet 1   simvastatin (ZOCOR) 20 MG tablet TAKE 1 TABLET(20 MG) BY MOUTH DAILY 90 tablet 0   triamterene-hydrochlorothiazide (MAXZIDE-25) 37.5-25 MG tablet Take 1 tablet by mouth daily. 90 tablet 3   No facility-administered medications prior to visit.    Allergies  Allergen Reactions   Sulfonamide Derivatives Other (See Comments)    headache    Review of Systems  Constitutional:  Negative for fever and malaise/fatigue.  HENT:  Negative for congestion.   Eyes:   Negative for blurred vision.  Respiratory:  Negative for shortness of breath.   Cardiovascular:  Negative for chest pain, palpitations and leg swelling.  Gastrointestinal:  Negative for abdominal pain, blood in stool and nausea.  Genitourinary:  Negative for dysuria and frequency.  Musculoskeletal:  Negative for falls.  Skin:  Negative for rash.  Neurological:  Negative for dizziness, loss of consciousness and headaches.  Endo/Heme/Allergies:  Negative for environmental allergies.  Psychiatric/Behavioral:  Negative for depression. The patient is not nervous/anxious.        Objective:    Physical Exam Vitals and nursing note reviewed.  Constitutional:      General: She is not in acute distress.    Appearance: Normal appearance. She is not ill-appearing.  HENT:     Head: Normocephalic and atraumatic.     Right Ear: External ear normal.     Left Ear: External ear normal.  Eyes:     Extraocular Movements: Extraocular movements intact.     Pupils: Pupils are equal, round, and reactive to light.  Cardiovascular:     Rate and Rhythm: Normal rate and regular rhythm.     Heart sounds: Normal heart sounds. No murmur heard.    No gallop.  Pulmonary:     Effort: Pulmonary effort is normal. No respiratory distress.     Breath sounds: Normal breath sounds. No wheezing or rales.  Skin:    General: Skin is warm and dry.  Neurological:     Mental Status: She is alert and oriented to person, place, and time.  Psychiatric:        Judgment: Judgment normal.     BP (!) 138/90 (BP Location: Right Arm, Patient Position: Sitting, Cuff Size: Normal)   Pulse 80   Temp 98.1 F (36.7 C) (Oral)   Resp 18   Ht 5\' 3"  (1.6 m)   Wt 141 lb (64 kg)   SpO2 98%   BMI 24.98 kg/m  Wt Readings from Last 3 Encounters:  02/01/22 141 lb (64 kg)  09/26/21 128 lb 12.8 oz (58.4 kg)  09/07/21 132 lb 6.4 oz (60.1 kg)    Diabetic Foot Exam - Simple   No data filed    Lab Results  Component Value Date    WBC 5.7 09/26/2021   HGB 14.5 09/26/2021   HCT 44.5 09/26/2021   PLT 269.0 09/26/2021   GLUCOSE 122 (H) 11/01/2021   CHOL 193 09/26/2021   TRIG 83.0 09/26/2021   HDL 78.90 09/26/2021  LDLCALC 97 09/26/2021   ALT 13 11/01/2021   AST 16 11/01/2021   NA 141 11/01/2021   K 4.1 11/01/2021   CL 105 11/01/2021   CREATININE 0.99 11/01/2021   BUN 28 (H) 11/01/2021   CO2 26 11/01/2021   TSH 2.48 12/18/2018   INR 1.04 06/02/2010   HGBA1C 6.2 03/27/2021   MICROALBUR <0.7 07/24/2019    Lab Results  Component Value Date   TSH 2.48 12/18/2018   Lab Results  Component Value Date   WBC 5.7 09/26/2021   HGB 14.5 09/26/2021   HCT 44.5 09/26/2021   MCV 83.0 09/26/2021   PLT 269.0 09/26/2021   Lab Results  Component Value Date   NA 141 11/01/2021   K 4.1 11/01/2021   CO2 26 11/01/2021   GLUCOSE 122 (H) 11/01/2021   BUN 28 (H) 11/01/2021   CREATININE 0.99 11/01/2021   BILITOT 0.6 11/01/2021   ALKPHOS 103 11/01/2021   AST 16 11/01/2021   ALT 13 11/01/2021   PROT 6.8 11/01/2021   ALBUMIN 4.3 11/01/2021   CALCIUM 9.6 11/01/2021   GFR 52.15 (L) 11/01/2021   Lab Results  Component Value Date   CHOL 193 09/26/2021   Lab Results  Component Value Date   HDL 78.90 09/26/2021   Lab Results  Component Value Date   LDLCALC 97 09/26/2021   Lab Results  Component Value Date   TRIG 83.0 09/26/2021   Lab Results  Component Value Date   CHOLHDL 2 09/26/2021   Lab Results  Component Value Date   HGBA1C 6.2 03/27/2021       Assessment & Plan:   Problem List Items Addressed This Visit       Unprioritized   Hyperlipidemia    Encourage heart healthy diet such as MIND or DASH diet, increase exercise, avoid trans fats, simple carbohydrates and processed foods, consider a krill or fish or flaxseed oil cap daily.       Relevant Orders   Comprehensive metabolic panel   Lipid panel   Essential hypertension - Primary    Well controlled, no changes to meds. Encouraged  heart healthy diet such as the DASH diet and exercise as tolerated.       Relevant Orders   Comprehensive metabolic panel   Lipid panel   Other Visit Diagnoses     Need for influenza vaccination       Relevant Orders   Flu Vaccine QUAD High Dose(Fluad) (Completed)      No orders of the defined types were placed in this encounter.   IAnn Held, DO, personally preformed the services described in this documentation.  All medical record entries made by the scribe were at my direction and in my presence.  I have reviewed the chart and discharge instructions (if applicable) and agree that the record reflects my personal performance and is accurate and complete. 02/01/2022   I,Amber Collins,acting as a scribe for Ann Held, DO.,have documented all relevant documentation on the behalf of Ann Held, DO,as directed by  Ann Held, DO while in the presence of Ann Held, DO.    Ann Held, DO

## 2022-02-01 NOTE — Patient Instructions (Signed)

## 2022-02-01 NOTE — Assessment & Plan Note (Signed)
Well controlled, no changes to meds. Encouraged heart healthy diet such as the DASH diet and exercise as tolerated.  °

## 2022-02-01 NOTE — Assessment & Plan Note (Signed)
Encourage heart healthy diet such as MIND or DASH diet, increase exercise, avoid trans fats, simple carbohydrates and processed foods, consider a krill or fish or flaxseed oil cap daily.  °

## 2022-02-02 LAB — COMPREHENSIVE METABOLIC PANEL
ALT: 14 U/L (ref 0–35)
AST: 18 U/L (ref 0–37)
Albumin: 4.6 g/dL (ref 3.5–5.2)
Alkaline Phosphatase: 78 U/L (ref 39–117)
BUN: 29 mg/dL — ABNORMAL HIGH (ref 6–23)
CO2: 25 mEq/L (ref 19–32)
Calcium: 10.2 mg/dL (ref 8.4–10.5)
Chloride: 102 mEq/L (ref 96–112)
Creatinine, Ser: 1.13 mg/dL (ref 0.40–1.20)
GFR: 44.42 mL/min — ABNORMAL LOW (ref >60.00)
Glucose, Bld: 111 mg/dL — ABNORMAL HIGH (ref 70–99)
Potassium: 4.2 mEq/L (ref 3.5–5.1)
Sodium: 139 mEq/L (ref 135–145)
Total Bilirubin: 0.9 mg/dL (ref 0.2–1.2)
Total Protein: 7.1 g/dL (ref 6.0–8.3)

## 2022-02-02 LAB — LIPID PANEL
Cholesterol: 165 mg/dL (ref 0–200)
HDL: 74.8 mg/dL (ref >39.00)
LDL Cholesterol: 74 mg/dL (ref 0–99)
NonHDL: 90.24
Total CHOL/HDL Ratio: 2
Triglycerides: 83 mg/dL (ref 0.0–149.0)
VLDL: 16.6 mg/dL (ref 0.0–40.0)

## 2022-02-04 ENCOUNTER — Other Ambulatory Visit: Payer: Self-pay | Admitting: Family Medicine

## 2022-02-04 DIAGNOSIS — I10 Essential (primary) hypertension: Secondary | ICD-10-CM

## 2022-02-12 ENCOUNTER — Encounter: Payer: Self-pay | Admitting: *Deleted

## 2022-02-22 DIAGNOSIS — Z961 Presence of intraocular lens: Secondary | ICD-10-CM | POA: Diagnosis not present

## 2022-02-22 DIAGNOSIS — H04123 Dry eye syndrome of bilateral lacrimal glands: Secondary | ICD-10-CM | POA: Diagnosis not present

## 2022-02-22 DIAGNOSIS — E119 Type 2 diabetes mellitus without complications: Secondary | ICD-10-CM | POA: Diagnosis not present

## 2022-02-22 DIAGNOSIS — H40013 Open angle with borderline findings, low risk, bilateral: Secondary | ICD-10-CM | POA: Diagnosis not present

## 2022-02-22 LAB — HM DIABETES EYE EXAM

## 2022-03-12 ENCOUNTER — Other Ambulatory Visit: Payer: Self-pay | Admitting: Family Medicine

## 2022-03-12 DIAGNOSIS — F411 Generalized anxiety disorder: Secondary | ICD-10-CM

## 2022-03-12 NOTE — Telephone Encounter (Signed)
Requesting: diazepam 5mg   Contract: 10/20/12 UDS: 10/20/12 Last Visit: 02/01/22 Next Visit: None Last Refill: 01/15/22 #30 and 0RF   Please Advise

## 2022-04-16 ENCOUNTER — Other Ambulatory Visit: Payer: Self-pay | Admitting: Family Medicine

## 2022-04-16 DIAGNOSIS — I10 Essential (primary) hypertension: Secondary | ICD-10-CM

## 2022-04-16 DIAGNOSIS — E785 Hyperlipidemia, unspecified: Secondary | ICD-10-CM

## 2022-06-09 ENCOUNTER — Other Ambulatory Visit: Payer: Self-pay | Admitting: Family Medicine

## 2022-06-09 DIAGNOSIS — I10 Essential (primary) hypertension: Secondary | ICD-10-CM

## 2022-08-06 ENCOUNTER — Other Ambulatory Visit: Payer: Self-pay | Admitting: Family Medicine

## 2022-08-06 DIAGNOSIS — I10 Essential (primary) hypertension: Secondary | ICD-10-CM

## 2022-08-06 DIAGNOSIS — F411 Generalized anxiety disorder: Secondary | ICD-10-CM

## 2022-08-06 NOTE — Telephone Encounter (Signed)
Requesting: diazepam 5mg   Contract: 07/24/19 UDS:10/20/2012 Last Visit: 02/01/22 Next Visit: None Last Refill: 03/12/22 #30 and 2RF  Please Advise

## 2022-08-08 ENCOUNTER — Telehealth: Payer: Self-pay | Admitting: Family Medicine

## 2022-08-08 NOTE — Telephone Encounter (Signed)
[place] to schedule their annual wellness visit. Patient declined to schedule AWV at this time. Try again next year   [name]; [facility] [employer] Dial: [phone]

## 2022-09-03 ENCOUNTER — Other Ambulatory Visit: Payer: Self-pay | Admitting: Family Medicine

## 2022-09-03 DIAGNOSIS — I10 Essential (primary) hypertension: Secondary | ICD-10-CM

## 2022-09-04 ENCOUNTER — Other Ambulatory Visit: Payer: Self-pay | Admitting: Family Medicine

## 2022-09-04 DIAGNOSIS — I10 Essential (primary) hypertension: Secondary | ICD-10-CM

## 2022-09-24 ENCOUNTER — Other Ambulatory Visit: Payer: Self-pay | Admitting: Family Medicine

## 2022-09-24 ENCOUNTER — Telehealth: Payer: Self-pay | Admitting: Family Medicine

## 2022-09-24 DIAGNOSIS — I10 Essential (primary) hypertension: Secondary | ICD-10-CM

## 2022-09-24 MED ORDER — AMLODIPINE BESYLATE 2.5 MG PO TABS: ORAL_TABLET | ORAL | 0 refills | Status: DC

## 2022-09-24 MED ORDER — TRIAMTERENE-HCTZ 37.5-25 MG PO TABS: 1.0000 | ORAL_TABLET | Freq: Every day | ORAL | 0 refills | Status: DC

## 2022-09-24 NOTE — Telephone Encounter (Signed)
Rx sent 

## 2022-09-24 NOTE — Addendum Note (Signed)
Addended by: Roxanne Gates on: 09/24/2022 04:44 PM   Modules accepted: Orders

## 2022-09-24 NOTE — Telephone Encounter (Signed)
Pt stated that she is aware she needs to come in for an appt but she is having a bit a dental work over the next couple of weeks and just wants enough to get her through this time.  Prescription Request  09/24/2022  Is this a "Controlled Substance" medicine? No  LOV: Visit date not found  What is the name of the medication or equipment?   triamterene-hydrochlorothiazide (MAXZIDE-25) 37.5-25 MG tablet [[id number]]   amLODipine (NORVASC) 2.5 MG tablet [[id number]]   Have you contacted your pharmacy to request a refill? No   Which pharmacy would you like this sent to?  [id number] [place], [place] - [address] AT [facility] [address] & [address] [place] [address] Phone: [phone] Fax: [phone]    Patient notified that their request is being sent to the clinical staff for review and that they should receive a response within 2 business days.   Please advise at Mobile [phone] (mobile)

## 2022-10-24 ENCOUNTER — Other Ambulatory Visit: Payer: Self-pay | Admitting: Family Medicine

## 2022-10-24 DIAGNOSIS — I10 Essential (primary) hypertension: Secondary | ICD-10-CM

## 2022-11-06 ENCOUNTER — Ambulatory Visit: Payer: Medicare Other | Admitting: Family Medicine

## 2022-11-13 ENCOUNTER — Ambulatory Visit (INDEPENDENT_AMBULATORY_CARE_PROVIDER_SITE_OTHER): Payer: Medicare Other | Admitting: Family Medicine

## 2022-11-13 ENCOUNTER — Encounter: Payer: Self-pay | Admitting: Family Medicine

## 2022-11-13 VITALS — BP 142/80 | HR 80 | Temp 98.3°F | Resp 18 | Ht 63.0 in | Wt 148.0 lb

## 2022-11-13 DIAGNOSIS — E785 Hyperlipidemia, unspecified: Secondary | ICD-10-CM | POA: Diagnosis not present

## 2022-11-13 DIAGNOSIS — I10 Essential (primary) hypertension: Secondary | ICD-10-CM | POA: Diagnosis not present

## 2022-11-13 DIAGNOSIS — E118 Type 2 diabetes mellitus with unspecified complications: Secondary | ICD-10-CM

## 2022-11-13 DIAGNOSIS — E1169 Type 2 diabetes mellitus with other specified complication: Secondary | ICD-10-CM

## 2022-11-13 DIAGNOSIS — R739 Hyperglycemia, unspecified: Secondary | ICD-10-CM

## 2022-11-13 DIAGNOSIS — F411 Generalized anxiety disorder: Secondary | ICD-10-CM

## 2022-11-13 MED ORDER — TRIAMTERENE-HCTZ 37.5-25 MG PO TABS
1.0000 | ORAL_TABLET | Freq: Every day | ORAL | 1 refills | Status: DC
Start: 2022-11-13 — End: 2023-06-13

## 2022-11-13 MED ORDER — OLMESARTAN MEDOXOMIL 40 MG PO TABS
ORAL_TABLET | ORAL | 1 refills | Status: DC
Start: 2022-11-13 — End: 2023-05-31

## 2022-11-13 MED ORDER — SIMVASTATIN 20 MG PO TABS: 20.0000 mg | ORAL_TABLET | Freq: Every day | ORAL | 1 refills | Status: DC

## 2022-11-13 MED ORDER — METOPROLOL SUCCINATE ER 100 MG PO TB24: 100.0000 mg | ORAL_TABLET | Freq: Every day | ORAL | 1 refills | Status: DC

## 2022-11-13 MED ORDER — DIAZEPAM 5 MG PO TABS: 5.0000 mg | ORAL_TABLET | Freq: Every day | ORAL | 1 refills | Status: DC | PRN

## 2022-11-13 MED ORDER — AMLODIPINE BESYLATE 2.5 MG PO TABS: ORAL_TABLET | ORAL | 1 refills | Status: DC

## 2022-11-13 NOTE — Assessment & Plan Note (Signed)
hgba1c to be checked minimize simple carbs. Increase exercise as tolerated. Continue current meds 

## 2022-11-13 NOTE — Assessment & Plan Note (Signed)
Encourage heart healthy diet such as MIND or DASH diet, increase exercise, avoid trans fats, simple carbohydrates and processed foods, consider a krill or fish or flaxseed oil cap daily.  °

## 2022-11-13 NOTE — Assessment & Plan Note (Signed)
Well controlled, no changes to meds. Encouraged heart healthy diet such as the DASH diet and exercise as tolerated.  °

## 2022-11-13 NOTE — Patient Instructions (Signed)

## 2022-11-13 NOTE — Progress Notes (Signed)
Established Patient Office Visit  Subjective   Patient ID: [name], female    DOB: 04-Mar-1935  Age: 88 y.o. MRN: [id number]  Chief Complaint  Patient presents with   Hypertension   Hyperlipidemia   Follow-up    HPI Discussed the use of AI scribe software for clinical note transcription with the patient, who gave verbal consent to proceed.  History of Present Illness   The patient presents for a medication review. They were previously prescribed sodium tablets (Voltaren) for inflammation, likely related to arthritis, by another provider. However, they have stopped taking this medication and it has been removed from their medication list. They were also prescribed Flonase, but they have stopped using it as they forgot the indication for its use. They were also prescribed a muscle relaxer, Robaxin, but they have stopped taking this as well. They are currently taking amlodipine, Valium, metoprolol, olmesartan, simvastatin, and Maxzide (triamterene). They report that the triamterene has been particularly helpful and their blood pressure was 142/80 at this visit. They have stopped taking aspirin. They report that they are having a busy summer and are dealing with the aftermath of a death in the family. They report that they are living alone and have limited mobility, using a walker for safety. They are still driving, but only within a limited radius.      Patient Active Problem List   Diagnosis Date Noted   Status post right hip replacement 07/31/2021   Muscle spasm 03/27/2021   Hyperlipidemia associated with type 2 diabetes mellitus (HCC) 11/21/2017   Seasonal allergies 11/21/2017   Hypokalemia 08/03/2010   Situational mixed anxiety and depressive disorder 08/03/2010   Hyperlipidemia 11/01/2006   THYROID NODULE, HX OF 11/01/2006   Type 2 diabetes mellitus with complication, without long-term current use of insulin (HCC) 09/24/2006   Essential hypertension 09/24/2006   ABSCESS,  RECTUM 09/24/2006   FATTY LIVER DISEASE 09/24/2006   LIVER FUNCTION TESTS, ABNORMAL 09/24/2006   FOOT INJURY 09/24/2006   HEMORRHOIDECTOMY, HX OF 09/24/2006   TONSILLECTOMY AND ADENOIDECTOMY, HX OF 03/17/2001   Past Medical History:  Diagnosis Date   Abscess, rectum    Diabetes mellitus    type 2   Fatty liver disease, nonalcoholic    Hyperlipidemia    Hypertension    Thyroid nodule    Past Surgical History:  Procedure Laterality Date   ABDOMINAL HYSTERECTOMY     ANAL FISSURE REPAIR     CATARACT EXTRACTION, BILATERAL  Feb   EYE SURGERY  06/2013   cataract b/l   HEMORRHOID SURGERY     TONSILLECTOMY AND ADENOIDECTOMY     Social History   Tobacco Use   Smoking status: Never   Smokeless tobacco: Never  Substance Use Topics   Alcohol use: No   Drug use: No   Social History   Socioeconomic History   Marital status: Married    Spouse name: Not on file   Number of children: Not on file   Years of education: Not on file   Highest education level: Not on file  Occupational History   Occupation: retired Runner, broadcasting/film/video  Tobacco Use   Smoking status: Never   Smokeless tobacco: Never  Substance and Sexual Activity   Alcohol use: No   Drug use: No   Sexual activity: Yes    Partners: Male  Other Topics Concern   Not on file  Social History Narrative   Exercise- walks everyday   Lives with husband   Social Determinants of  Health   Financial Resource Strain: Not on file  Food Insecurity: Not on file  Transportation Needs: Not on file  Physical Activity: Not on file  Stress: Not on file  Social Connections: Not on file  Intimate Partner Violence: Not on file   Family Status  Relation Name Status   Brother  Alive   Mother  Deceased   Sister  Alive   Father  Deceased   Unknown  (Not Specified)   Unknown  (Not Specified)   Family History  Problem Relation Age of Onset   Other Brother        Rhabdo secondary to Statin   Diabetes Brother    Hypertension Brother     Diabetes Mother    Hypertension Mother    Hypertension Sister    Mental retardation Sister    Diabetes Unknown    Hypertension Unknown    Allergies  Allergen Reactions   Sulfonamide Derivatives Other (See Comments)    headache      Review of Systems  Constitutional:  Negative for fever and malaise/fatigue.  HENT:  Negative for congestion.   Eyes:  Negative for blurred vision.  Respiratory:  Negative for cough and shortness of breath.   Cardiovascular:  Negative for chest pain, palpitations and leg swelling.  Gastrointestinal:  Negative for abdominal pain, blood in stool, nausea and vomiting.  Genitourinary:  Negative for dysuria and frequency.  Musculoskeletal:  Negative for back pain and falls.  Skin:  Negative for rash.  Neurological:  Negative for dizziness, loss of consciousness and headaches.  Endo/Heme/Allergies:  Negative for environmental allergies.  Psychiatric/Behavioral:  Negative for depression. The patient is not nervous/anxious.       Objective:     BP (!) 142/80 (BP Location: Right Arm, Patient Position: Sitting, Cuff Size: Normal)   Pulse 80   Temp 98.3 F (36.8 C) (Oral)   Resp 18   Ht 5\' 3"  (1.6 m)   Wt 148 lb (67.1 kg)   SpO2 98%   BMI 26.22 kg/m  BP Readings from Last 3 Encounters:  11/13/22 (!) 142/80  02/01/22 (!) 138/90  09/26/21 124/67   Wt Readings from Last 3 Encounters:  11/13/22 148 lb (67.1 kg)  02/01/22 141 lb (64 kg)  09/26/21 128 lb 12.8 oz (58.4 kg)   SpO2 Readings from Last 3 Encounters:  11/13/22 98%  02/01/22 98%  09/26/21 98%      Physical Exam Vitals and nursing note reviewed.  Constitutional:      General: She is not in acute distress.    Appearance: Normal appearance. She is well-developed.  HENT:     Head: Normocephalic and atraumatic.     Right Ear: Tympanic membrane, ear canal and external ear normal. There is no impacted cerumen.     Left Ear: Tympanic membrane, ear canal and external ear normal. There is  no impacted cerumen.     Nose: Nose normal.     Mouth/Throat:     Mouth: Mucous membranes are moist.     Pharynx: Oropharynx is clear. No oropharyngeal exudate or posterior oropharyngeal erythema.  Eyes:     General: No scleral icterus.       Right eye: No discharge.        Left eye: No discharge.     Conjunctiva/sclera: Conjunctivae normal.     Pupils: Pupils are equal, round, and reactive to light.  Neck:     Thyroid: No thyromegaly or thyroid tenderness.     Vascular:  No JVD.  Cardiovascular:     Rate and Rhythm: Normal rate and regular rhythm.     Heart sounds: Normal heart sounds. No murmur heard. Pulmonary:     Effort: Pulmonary effort is normal. No respiratory distress.     Breath sounds: Normal breath sounds.  Abdominal:     General: Bowel sounds are normal. There is no distension.     Palpations: Abdomen is soft. There is no mass.     Tenderness: There is no abdominal tenderness. There is no guarding or rebound.  Genitourinary:    Vagina: Normal.  Musculoskeletal:        General: Normal range of motion.     Cervical back: Normal range of motion and neck supple.     Right lower leg: No edema.     Left lower leg: No edema.  Lymphadenopathy:     Cervical: No cervical adenopathy.  Skin:    General: Skin is warm and dry.     Findings: No erythema or rash.  Neurological:     Mental Status: She is alert and oriented to person, place, and time.     Cranial Nerves: No cranial nerve deficit.     Deep Tendon Reflexes: Reflexes are normal and symmetric.  Psychiatric:        Mood and Affect: Mood normal.        Behavior: Behavior normal.        Thought Content: Thought content normal.        Judgment: Judgment normal.      No results found for any visits on 11/13/22.  Last CBC Lab Results  Component Value Date   WBC 5.7 09/26/2021   HGB 14.5 09/26/2021   HCT 44.5 09/26/2021   MCV 83.0 09/26/2021   MCH 24.9 (L) 06/02/2010   RDW 14.3 09/26/2021   PLT 269.0  09/26/2021   Last metabolic panel Lab Results  Component Value Date   GLUCOSE 111 (H) 02/01/2022   NA 139 02/01/2022   K 4.2 02/01/2022   CL 102 02/01/2022   CO2 25 02/01/2022   BUN 29 (H) 02/01/2022   CREATININE 1.13 02/01/2022   GFR 44.42 (L) 02/01/2022   CALCIUM 10.2 02/01/2022   PROT 7.1 02/01/2022   ALBUMIN 4.6 02/01/2022   BILITOT 0.9 02/01/2022   ALKPHOS 78 02/01/2022   AST 18 02/01/2022   ALT 14 02/01/2022   Last lipids Lab Results  Component Value Date   CHOL 165 02/01/2022   HDL 74.80 02/01/2022   LDLCALC 74 02/01/2022   TRIG 83.0 02/01/2022   CHOLHDL 2 02/01/2022   Last hemoglobin A1c Lab Results  Component Value Date   HGBA1C 6.2 03/27/2021   Last thyroid functions Lab Results  Component Value Date   TSH 2.48 12/18/2018   Last vitamin D No results found for: "25OHVITD2", "25OHVITD3", "VD25OH" Last vitamin B12 and Folate No results found for: "VITAMINB12", "FOLATE"    The ASCVD Risk score (Arnett DK, et al., 2019) failed to calculate for the following reasons:   The 2019 ASCVD risk score is only valid for ages 80 to 32    Assessment & Plan:   Problem List Items Addressed This Visit       Unprioritized   Hyperlipidemia   Relevant Medications   amLODipine (NORVASC) 2.5 MG tablet   metoprolol succinate (TOPROL-XL) 100 MG 24 hr tablet   olmesartan (BENICAR) 40 MG tablet   simvastatin (ZOCOR) 20 MG tablet   triamterene-hydrochlorothiazide (MAXZIDE-25) 37.5-25 MG tablet  Other Relevant Orders   CBC with Differential/Platelet   Comprehensive metabolic panel   Lipid panel   TSH   Type 2 diabetes mellitus with complication, without long-term current use of insulin (HCC)    hgba1c to be checked  minimize simple carbs. Increase exercise as tolerated. Continue current meds       Relevant Medications   olmesartan (BENICAR) 40 MG tablet   simvastatin (ZOCOR) 20 MG tablet   Hyperlipidemia associated with type 2 diabetes mellitus (HCC)     Encourage heart healthy diet such as MIND or DASH diet, increase exercise, avoid trans fats, simple carbohydrates and processed foods, consider a krill or fish or flaxseed oil cap daily.        Relevant Medications   amLODipine (NORVASC) 2.5 MG tablet   metoprolol succinate (TOPROL-XL) 100 MG 24 hr tablet   olmesartan (BENICAR) 40 MG tablet   simvastatin (ZOCOR) 20 MG tablet   triamterene-hydrochlorothiazide (MAXZIDE-25) 37.5-25 MG tablet   Essential hypertension - Primary    Well controlled, no changes to meds. Encouraged heart healthy diet such as the DASH diet and exercise as tolerated.        Relevant Medications   amLODipine (NORVASC) 2.5 MG tablet   metoprolol succinate (TOPROL-XL) 100 MG 24 hr tablet   olmesartan (BENICAR) 40 MG tablet   simvastatin (ZOCOR) 20 MG tablet   triamterene-hydrochlorothiazide (MAXZIDE-25) 37.5-25 MG tablet   Other Relevant Orders   CBC with Differential/Platelet   Comprehensive metabolic panel   Lipid panel   TSH   Other Visit Diagnoses     Hyperglycemia       Relevant Orders   CBC with Differential/Platelet   Comprehensive metabolic panel   Lipid panel   TSH   Hemoglobin A1c   Generalized anxiety disorder       Relevant Medications   diazepam (VALIUM) 5 MG tablet   Primary hypertension       Relevant Medications   amLODipine (NORVASC) 2.5 MG tablet   metoprolol succinate (TOPROL-XL) 100 MG 24 hr tablet   olmesartan (BENICAR) 40 MG tablet   simvastatin (ZOCOR) 20 MG tablet   triamterene-hydrochlorothiazide (MAXZIDE-25) 37.5-25 MG tablet     Assessment and Plan    Hypertension: Well controlled with current regimen of Amlodipine, Metoprolol, Olmesartan, and Triamterene (Maxzide). Recent BP 142/80. -Continue current medications. -Refill Amlodipine, Metoprolol, Olmesartan, and Triamterene (Maxzide).  Medication review: Patient no longer taking Sodium tablet (Voltaren), Flonase, and Robaxin. -Remove Sodium tablet (Voltaren), Flonase,  and Robaxin from medication list.  Anxiety: Patient on Valium. -Refill Valium.  Hyperlipidemia: Patient on Simvastatin. -Refill Simvastatin.  General Health Maintenance: -Order labs for glucose, cholesterol, kidney, and liver function. -Provide patient with printed results since patient does not use online portal.  Follow-up: -Review lab results when available.        Return in about 6 months (around 05/16/2023), or if symptoms worsen or fail to improve, for hypertension, hyperlipidemia.    Donato Schultz, DO

## 2022-11-14 LAB — CBC WITH DIFFERENTIAL/PLATELET
Basophils Absolute: 0.1 10*3/uL (ref 0.0–0.1)
Basophils Relative: 1.1 % (ref 0.0–3.0)
Eosinophils Absolute: 0.1 10*3/uL (ref 0.0–0.7)
Eosinophils Relative: 0.9 % (ref 0.0–5.0)
HCT: 42.1 % (ref 36.0–46.0)
Hemoglobin: 13.9 g/dL (ref 12.0–15.0)
Lymphocytes Relative: 17.9 % (ref 12.0–46.0)
Lymphs Abs: 1.3 10*3/uL (ref 0.7–4.0)
MCHC: 32.9 g/dL (ref 30.0–36.0)
MCV: 84.2 fl (ref 78.0–100.0)
Monocytes Absolute: 0.4 10*3/uL (ref 0.1–1.0)
Monocytes Relative: 5.5 % (ref 3.0–12.0)
Neutro Abs: 5.2 10*3/uL (ref 1.4–7.7)
Neutrophils Relative %: 74.6 % (ref 43.0–77.0)
Platelets: 223 10*3/uL (ref 150.0–400.0)
RBC: 5.01 Mil/uL (ref 3.87–5.11)
RDW: 13.7 % (ref 11.5–15.5)
WBC: 7 10*3/uL (ref 4.0–10.5)

## 2022-11-14 LAB — COMPREHENSIVE METABOLIC PANEL
ALT: 14 U/L (ref 0–35)
AST: 18 U/L (ref 0–37)
Albumin: 4.5 g/dL (ref 3.5–5.2)
Alkaline Phosphatase: 72 U/L (ref 39–117)
BUN: 27 mg/dL — ABNORMAL HIGH (ref 6–23)
CO2: 26 mEq/L (ref 19–32)
Calcium: 10.4 mg/dL (ref 8.4–10.5)
Chloride: 103 mEq/L (ref 96–112)
Creatinine, Ser: 1.21 mg/dL — ABNORMAL HIGH (ref 0.40–1.20)
GFR: 40.69 mL/min — ABNORMAL LOW (ref >60.00)
Glucose, Bld: 118 mg/dL — ABNORMAL HIGH (ref 70–99)
Potassium: 4 mEq/L (ref 3.5–5.1)
Sodium: 141 mEq/L (ref 135–145)
Total Bilirubin: 1 mg/dL (ref 0.2–1.2)
Total Protein: 6.9 g/dL (ref 6.0–8.3)

## 2022-11-14 LAB — HEMOGLOBIN A1C: Hgb A1c MFr Bld: 6.2 % (ref 4.6–6.5)

## 2022-11-14 LAB — LIPID PANEL
Cholesterol: 145 mg/dL (ref 0–200)
HDL: 65.2 mg/dL (ref >39.00)
LDL Cholesterol: 65 mg/dL (ref 0–99)
NonHDL: 80.19
Total CHOL/HDL Ratio: 2
Triglycerides: 76 mg/dL (ref 0.0–149.0)
VLDL: 15.2 mg/dL (ref 0.0–40.0)

## 2022-11-14 LAB — TSH: TSH: 1.33 u[IU]/mL (ref 0.35–5.50)

## 2022-12-06 ENCOUNTER — Telehealth: Payer: Self-pay | Admitting: Family Medicine

## 2022-12-06 NOTE — Telephone Encounter (Signed)
Pt called and stated that when she received her medication, amLODipine (NORVASC) 2.5 MG tablet , the pharmacy only provided her with a 30 day supply instead 90. Pt stated that she told the pharmacy it was supposed to be 90 supply but they told her they could only prescribe her 30 due to her insurance not covering it. Per pharmacy, she will not be able to receive medication until September. I advised the pt that she should contact her insurance. Pt still requested for me to notify Dr. Laury Axon to help with her situation with the pharmacy not refilling the medication. Please advise pt.

## 2022-12-07 NOTE — Telephone Encounter (Signed)
Spoke with patient. Pt states she would be willing to pay for the 60 tablets. I advised patient she would need to call the pharmacy

## 2023-02-12 ENCOUNTER — Other Ambulatory Visit: Payer: Self-pay | Admitting: Family Medicine

## 2023-02-12 DIAGNOSIS — F411 Generalized anxiety disorder: Secondary | ICD-10-CM

## 2023-02-12 DIAGNOSIS — E785 Hyperlipidemia, unspecified: Secondary | ICD-10-CM

## 2023-02-12 NOTE — Telephone Encounter (Signed)
Requesting: diazepam 5mg   Contract: 08/06/19 UDS: None Last Visit: 11/13/22 Next Visit:  None Last Refill: 11/13/22 #30 and 1rf  Please Advise

## 2023-05-31 ENCOUNTER — Other Ambulatory Visit: Payer: Self-pay | Admitting: Family Medicine

## 2023-05-31 DIAGNOSIS — I10 Essential (primary) hypertension: Secondary | ICD-10-CM

## 2023-05-31 MED ORDER — OLMESARTAN MEDOXOMIL 40 MG PO TABS
ORAL_TABLET | ORAL | 1 refills | Status: DC
Start: 2023-05-31 — End: 2023-12-04

## 2023-05-31 MED ORDER — METOPROLOL SUCCINATE ER 100 MG PO TB24
100.0000 mg | ORAL_TABLET | Freq: Every day | ORAL | 1 refills | Status: DC
Start: 2023-05-31 — End: 2023-12-04

## 2023-05-31 NOTE — Telephone Encounter (Signed)
Copied from CRM [id number]. Topic: Clinical - Medication Refill >> May 31, 2023 10:31 AM [name] wrote: Most Recent Primary Care Visit:  Provider: Zola Button, Myrene Buddy R  Department: [facility]  Visit Type: OFFICE VISIT  Date: 11/13/2022  Medication: olmesartan (BENICAR) 40 MG tablet/metoprolol succinate (TOPROL-XL) 100 MG 24 hr tablet  Has the patient contacted their pharmacy? Yes (Agent: If no, request that the patient contact the pharmacy for the refill. If patient does not wish to contact the pharmacy document the reason why and proceed with request.) (Agent: If yes, when and what did the pharmacy advise?) - To try calling herself, due to no response back from Dr. Laury Axon  Is this the correct pharmacy for this prescription? Yes If no, delete pharmacy and type the correct one.  This is the patient's preferred pharmacy:  [facility] #[id number] [place], [place] - [address] & [address] [place] [address] Phone: [phone] Fax: [phone]   Has the prescription been filled recently? Yes  Is the patient out of the medication? No, will last until sunday  Has the patient been seen for an appointment in the last year OR does the patient have an upcoming appointment? Yes  Can we respond through MyChart? No  Agent: Please be advised that Rx refills may take up to 3 business days. We ask that you follow-up with your pharmacy.

## 2023-06-06 DIAGNOSIS — H353211 Exudative age-related macular degeneration, right eye, with active choroidal neovascularization: Secondary | ICD-10-CM | POA: Diagnosis not present

## 2023-06-06 DIAGNOSIS — H40013 Open angle with borderline findings, low risk, bilateral: Secondary | ICD-10-CM | POA: Diagnosis not present

## 2023-06-06 DIAGNOSIS — E119 Type 2 diabetes mellitus without complications: Secondary | ICD-10-CM | POA: Diagnosis not present

## 2023-06-06 DIAGNOSIS — Z961 Presence of intraocular lens: Secondary | ICD-10-CM | POA: Diagnosis not present

## 2023-06-06 DIAGNOSIS — H353122 Nonexudative age-related macular degeneration, left eye, intermediate dry stage: Secondary | ICD-10-CM | POA: Diagnosis not present

## 2023-06-06 LAB — HM DIABETES EYE EXAM

## 2023-06-10 ENCOUNTER — Ambulatory Visit: Payer: Medicare Other | Admitting: Family Medicine

## 2023-06-13 ENCOUNTER — Encounter: Payer: Self-pay | Admitting: Family Medicine

## 2023-06-13 ENCOUNTER — Ambulatory Visit: Payer: Medicare Other | Admitting: Family Medicine

## 2023-06-13 VITALS — BP 140/90 | HR 78 | Temp 98.0°F | Resp 18 | Ht 63.0 in | Wt 143.8 lb

## 2023-06-13 DIAGNOSIS — Z23 Encounter for immunization: Secondary | ICD-10-CM | POA: Diagnosis not present

## 2023-06-13 DIAGNOSIS — E118 Type 2 diabetes mellitus with unspecified complications: Secondary | ICD-10-CM

## 2023-06-13 DIAGNOSIS — E785 Hyperlipidemia, unspecified: Secondary | ICD-10-CM | POA: Diagnosis not present

## 2023-06-13 DIAGNOSIS — I10 Essential (primary) hypertension: Secondary | ICD-10-CM | POA: Diagnosis not present

## 2023-06-13 DIAGNOSIS — F411 Generalized anxiety disorder: Secondary | ICD-10-CM

## 2023-06-13 MED ORDER — AMLODIPINE BESYLATE 2.5 MG PO TABS: ORAL_TABLET | ORAL | 1 refills | Status: DC

## 2023-06-13 MED ORDER — TRIAMTERENE-HCTZ 37.5-25 MG PO TABS
1.0000 | ORAL_TABLET | Freq: Every day | ORAL | 1 refills | Status: DC
Start: 2023-06-13 — End: 2023-12-04

## 2023-06-13 MED ORDER — DIAZEPAM 5 MG PO TABS: ORAL_TABLET | ORAL | 1 refills | Status: DC

## 2023-06-13 NOTE — Progress Notes (Signed)
 Established Patient Office Visit  Subjective   Patient ID: [name], female    DOB: [birth year]  Age: 89 y.o. MRN: [id number]  Chief Complaint  Patient presents with  . Hypertension  . Hyperlipidemia  . Follow-up    HPI Discussed the use of AI scribe software for clinical note transcription with the patient, who gave verbal consent to proceed.  History of Present Illness   [name] is an 89 year old female with hypertension who presents with medication refill issues and a new diagnosis of macular degeneration.  She is experiencing issues with her blood pressure medication refills. She is currently on four antihypertensive medications, but there was a discrepancy with the quantity received from the pharmacy. She received only 30 pills instead of the usual 90 for two of her medications. This issue has been ongoing since May 21, 2023.  She has a new diagnosis of macular degeneration in her right eye, identified by her eye doctor last Thursday. She is awaiting a call to schedule treatment, which involves injections into the eye. She is concerned about the impact on her vision, particularly regarding her ability to drive.  She continues to use a walker due to balance issues and reports a history of hip surgery last year. She experiences pain in her lower back, especially when reaching for items, and attributes some of her mobility issues to a weak back. She has previously engaged in physical therapy, which she believes triggered some of her current issues. No swelling in the ankles.      Patient Active Problem List   Diagnosis Date Noted  . Status post right hip replacement 07/31/2021  . Muscle spasm 03/27/2021  . Hyperlipidemia associated with type 2 diabetes mellitus (HCC) 11/21/2017  . Seasonal allergies 11/21/2017  . Hypokalemia 08/03/2010  . Situational mixed anxiety and depressive disorder 08/03/2010  . Hyperlipidemia 11/01/2006  . THYROID  NODULE, HX OF  11/01/2006  . Type 2 diabetes mellitus with complication, without long-term current use of insulin (HCC) 09/24/2006  . Essential hypertension 09/24/2006  . ABSCESS, RECTUM 09/24/2006  . FATTY LIVER DISEASE 09/24/2006  . LIVER FUNCTION TESTS, ABNORMAL 09/24/2006  . FOOT INJURY 09/24/2006  . HEMORRHOIDECTOMY, HX OF 09/24/2006  . TONSILLECTOMY AND ADENOIDECTOMY, HX OF 03/17/2001   Past Medical History:  Diagnosis Date  . Abscess, rectum   . Diabetes mellitus    type 2  . Fatty liver disease, nonalcoholic   . Hyperlipidemia   . Hypertension   . Thyroid  nodule    Past Surgical History:  Procedure Laterality Date  . ABDOMINAL HYSTERECTOMY    . ANAL FISSURE REPAIR    . CATARACT EXTRACTION, BILATERAL  Feb  . EYE SURGERY  06/2013   cataract b/l  . HEMORRHOID SURGERY    . TONSILLECTOMY AND ADENOIDECTOMY     Social History   Tobacco Use  . Smoking status: Never  . Smokeless tobacco: Never  Substance Use Topics  . Alcohol use: No  . Drug use: No   Social History   Socioeconomic History  . Marital status: Married    Spouse name: Not on file  . Number of children: Not on file  . Years of education: Not on file  . Highest education level: Not on file  Occupational History  . Occupation: retired runner, broadcasting/film/video  Tobacco Use  . Smoking status: Never  . Smokeless tobacco: Never  Substance and Sexual Activity  . Alcohol use: No  . Drug use: No  . Sexual  activity: Yes    Partners: Male  Other Topics Concern  . Not on file  Social History Narrative   Exercise- walks everyday   Lives with husband   Social Drivers of Corporate Investment Banker Strain: Not on file  Food Insecurity: Not on file  Transportation Needs: Not on file  Physical Activity: Not on file  Stress: Not on file  Social Connections: Not on file  Intimate Partner Violence: Not on file   Family Status  Relation Name Status  . Brother  Alive  . Mother  Deceased  . Sister  Alive  . Father  Deceased  .  Unknown  (Not Specified)  . Unknown  (Not Specified)  No partnership data on file   Family History  Problem Relation Age of Onset  . Other Brother        Rhabdo secondary to Statin  . Diabetes Brother   . Hypertension Brother   . Diabetes Mother   . Hypertension Mother   . Hypertension Sister   . Mental retardation Sister   . Diabetes Unknown   . Hypertension Unknown    Allergies  Allergen Reactions  . Sulfonamide Derivatives Other (See Comments)    headache      Review of Systems  Constitutional:  Negative for fever and malaise/fatigue.  HENT:  Negative for congestion.   Eyes:  Negative for blurred vision.  Respiratory:  Negative for cough and shortness of breath.   Cardiovascular:  Negative for chest pain, palpitations and leg swelling.  Gastrointestinal:  Negative for abdominal pain, blood in stool, nausea and vomiting.  Genitourinary:  Negative for dysuria and frequency.  Musculoskeletal:  Negative for back pain and falls.  Skin:  Negative for rash.  Neurological:  Negative for dizziness, loss of consciousness and headaches.  Endo/Heme/Allergies:  Negative for environmental allergies.  Psychiatric/Behavioral:  Negative for depression. The patient is not nervous/anxious.       Objective:     BP (!) 140/90 (BP Location: Right Arm, Patient Position: Sitting, Cuff Size: Normal)   Pulse 78   Temp 98 F (36.7 C) (Oral)   Resp 18   Ht 5' 3 (1.6 m)   Wt 143 lb 12.8 oz (65.2 kg)   SpO2 99%   BMI 25.47 kg/m  BP Readings from Last 3 Encounters:  06/13/23 (!) 140/90  11/13/22 (!) 142/80  02/01/22 (!) 138/90   Wt Readings from Last 3 Encounters:  06/13/23 143 lb 12.8 oz (65.2 kg)  11/13/22 148 lb (67.1 kg)  02/01/22 141 lb (64 kg)   SpO2 Readings from Last 3 Encounters:  06/13/23 99%  11/13/22 98%  02/01/22 98%      Physical Exam Vitals and nursing note reviewed.  Constitutional:      General: She is not in acute distress.    Appearance: Normal  appearance. She is well-developed.  HENT:     Head: Normocephalic and atraumatic.  Eyes:     General: No scleral icterus.       Right eye: No discharge.        Left eye: No discharge.  Cardiovascular:     Rate and Rhythm: Normal rate and regular rhythm.     Heart sounds: No murmur heard. Pulmonary:     Effort: Pulmonary effort is normal. No respiratory distress.     Breath sounds: Normal breath sounds.  Musculoskeletal:        General: Normal range of motion.     Cervical back: Normal range  of motion and neck supple.     Right lower leg: No edema.     Left lower leg: No edema.  Skin:    General: Skin is warm and dry.  Neurological:     Mental Status: She is alert and oriented to person, place, and time.  Psychiatric:        Mood and Affect: Mood normal.        Behavior: Behavior normal.        Thought Content: Thought content normal.        Judgment: Judgment normal.     No results found for any visits on 06/13/23.  Last CBC Lab Results  Component Value Date   WBC 7.0 11/13/2022   HGB 13.9 11/13/2022   HCT 42.1 11/13/2022   MCV 84.2 11/13/2022   MCH 24.9 (L) 06/02/2010   RDW 13.7 11/13/2022   PLT 223.0 11/13/2022   Last metabolic panel Lab Results  Component Value Date   GLUCOSE 118 (H) 11/13/2022   NA 141 11/13/2022   K 4.0 11/13/2022   CL 103 11/13/2022   CO2 26 11/13/2022   BUN 27 (H) 11/13/2022   CREATININE 1.21 (H) 11/13/2022   GFR 40.69 (L) 11/13/2022   CALCIUM 10.4 11/13/2022   PROT 6.9 11/13/2022   ALBUMIN 4.5 11/13/2022   BILITOT 1.0 11/13/2022   ALKPHOS 72 11/13/2022   AST 18 11/13/2022   ALT 14 11/13/2022   Last lipids Lab Results  Component Value Date   CHOL 145 11/13/2022   HDL 65.20 11/13/2022   LDLCALC 65 11/13/2022   TRIG 76.0 11/13/2022   CHOLHDL 2 11/13/2022   Last hemoglobin A1c Lab Results  Component Value Date   HGBA1C 6.2 11/13/2022   Last thyroid  functions Lab Results  Component Value Date   TSH 1.33 11/13/2022    Last vitamin D No results found for: 25OHVITD2, 25OHVITD3, VD25OH Last vitamin B12 and Folate No results found for: VITAMINB12, FOLATE    The ASCVD Risk score (Arnett DK, et al., 2019) failed to calculate for the following reasons:   The 2019 ASCVD risk score is only valid for ages 44 to 73    Assessment & Plan:   Problem List Items Addressed This Visit       Unprioritized   Hyperlipidemia   Relevant Medications   triamterene -hydrochlorothiazide (MAXZIDE-25) 37.5-25 MG tablet   amLODipine  (NORVASC ) 2.5 MG tablet   Other Relevant Orders   Comprehensive metabolic panel   Lipid panel   Type 2 diabetes mellitus with complication, without long-term current use of insulin (HCC) - Primary   Relevant Orders   Comprehensive metabolic panel   Hemoglobin A1c   Essential hypertension   Relevant Medications   triamterene -hydrochlorothiazide (MAXZIDE-25) 37.5-25 MG tablet   amLODipine  (NORVASC ) 2.5 MG tablet   Other Visit Diagnoses       Primary hypertension       Relevant Medications   triamterene -hydrochlorothiazide (MAXZIDE-25) 37.5-25 MG tablet   amLODipine  (NORVASC ) 2.5 MG tablet   Other Relevant Orders   CBC with Differential/Platelet     Generalized anxiety disorder       Relevant Medications   diazepam  (VALIUM ) 5 MG tablet     Need for influenza vaccination       Relevant Orders   Flu Vaccine Trivalent High Dose (Fluad) (Completed)     Assessment and Plan    Age-related Macular Degeneration   A new diagnosis of age-related macular degeneration in the right eye has been confirmed by  an ophthalmologist. Intravitreal injections are anticipated, likely on a monthly basis. The procedure involves minimal pain due to numbing agents. Early treatment is crucial to delay progression, though outcomes can vary. Follow up with the ophthalmologist to schedule injections and contact her if no appointment is set by tomorrow.  Hypertension   Chronic hypertension is  managed with four antihypertensive medications. There are pharmacy supply issues, with only 30 pills received instead of 90. Blood pressure is high but within the usual range. Consistent medication supply and regular blood pressure monitoring are important. Ensure the prescription for 90 pills is filled and contact the pharmacy about supply issues. Monitor blood pressure regularly.  Chronic Back Pain   Chronic lower back pain is worsened by physical activity, with a history of hip surgery and weak back muscles. Physical therapy has led to complications, causing balance difficulties and reliance on a walker. Modified physical therapy is advised, and a referral to a pain specialist may be considered if pain persists. Avoid activities that strain the lower back.  General Health Maintenance   A flu shot was not received this season, and flu-like symptoms occurred after Christmas. Flu vaccination is recommended as soon as possible.  Follow-up   Follow up with the ophthalmologist for the intravitreal injection schedule. Monitor blood pressure and address medication supply issues with the pharmacy.        No follow-ups on file.    Maanya Hippert R [name] Chase, DO

## 2023-06-14 LAB — CBC WITH DIFFERENTIAL/PLATELET
Basophils Absolute: 0.1 10*3/uL (ref 0.0–0.1)
Basophils Relative: 1.2 % (ref 0.0–3.0)
Eosinophils Absolute: 0 10*3/uL (ref 0.0–0.7)
Eosinophils Relative: 0.8 % (ref 0.0–5.0)
HCT: 43 % (ref 36.0–46.0)
Hemoglobin: 14.2 g/dL (ref 12.0–15.0)
Lymphocytes Relative: 24.3 % (ref 12.0–46.0)
Lymphs Abs: 1.5 10*3/uL (ref 0.7–4.0)
MCHC: 33.1 g/dL (ref 30.0–36.0)
MCV: 85.4 fL (ref 78.0–100.0)
Monocytes Absolute: 0.4 10*3/uL (ref 0.1–1.0)
Monocytes Relative: 6.7 % (ref 3.0–12.0)
Neutro Abs: 4.2 10*3/uL (ref 1.4–7.7)
Neutrophils Relative %: 67 % (ref 43.0–77.0)
Platelets: 221 10*3/uL (ref 150.0–400.0)
RBC: 5.03 Mil/uL (ref 3.87–5.11)
RDW: 14 % (ref 11.5–15.5)
WBC: 6.3 10*3/uL (ref 4.0–10.5)

## 2023-06-14 LAB — COMPREHENSIVE METABOLIC PANEL
ALT: 14 U/L (ref 0–35)
AST: 19 U/L (ref 0–37)
Albumin: 4.4 g/dL (ref 3.5–5.2)
Alkaline Phosphatase: 73 U/L (ref 39–117)
BUN: 32 mg/dL — ABNORMAL HIGH (ref 6–23)
CO2: 26 meq/L (ref 19–32)
Calcium: 10.1 mg/dL (ref 8.4–10.5)
Chloride: 103 meq/L (ref 96–112)
Creatinine, Ser: 1.33 mg/dL — ABNORMAL HIGH (ref 0.40–1.20)
GFR: 36.18 mL/min — ABNORMAL LOW (ref >60.00)
Glucose, Bld: 104 mg/dL — ABNORMAL HIGH (ref 70–99)
Potassium: 4.3 meq/L (ref 3.5–5.1)
Sodium: 143 meq/L (ref 135–145)
Total Bilirubin: 0.8 mg/dL (ref 0.2–1.2)
Total Protein: 7.1 g/dL (ref 6.0–8.3)

## 2023-06-14 LAB — LIPID PANEL
Cholesterol: 153 mg/dL (ref 0–200)
HDL: 75 mg/dL (ref >39.00)
LDL Cholesterol: 62 mg/dL (ref 0–99)
NonHDL: 78.29
Total CHOL/HDL Ratio: 2
Triglycerides: 79 mg/dL (ref 0.0–149.0)
VLDL: 15.8 mg/dL (ref 0.0–40.0)

## 2023-06-14 LAB — HEMOGLOBIN A1C: Hgb A1c MFr Bld: 6.3 % (ref 4.6–6.5)

## 2023-06-21 DIAGNOSIS — H35363 Drusen (degenerative) of macula, bilateral: Secondary | ICD-10-CM | POA: Diagnosis not present

## 2023-06-21 DIAGNOSIS — H35723 Serous detachment of retinal pigment epithelium, bilateral: Secondary | ICD-10-CM | POA: Diagnosis not present

## 2023-06-21 DIAGNOSIS — H353122 Nonexudative age-related macular degeneration, left eye, intermediate dry stage: Secondary | ICD-10-CM | POA: Diagnosis not present

## 2023-06-21 DIAGNOSIS — H353211 Exudative age-related macular degeneration, right eye, with active choroidal neovascularization: Secondary | ICD-10-CM | POA: Diagnosis not present

## 2023-07-19 DIAGNOSIS — H353211 Exudative age-related macular degeneration, right eye, with active choroidal neovascularization: Secondary | ICD-10-CM | POA: Diagnosis not present

## 2023-08-16 DIAGNOSIS — H353211 Exudative age-related macular degeneration, right eye, with active choroidal neovascularization: Secondary | ICD-10-CM | POA: Diagnosis not present

## 2023-09-25 ENCOUNTER — Other Ambulatory Visit: Payer: Self-pay | Admitting: Family Medicine

## 2023-09-25 DIAGNOSIS — F411 Generalized anxiety disorder: Secondary | ICD-10-CM

## 2023-09-25 NOTE — Telephone Encounter (Signed)
 Requesting: diazepam  5mg   Contract: None UDS: None Last Visit: 06/13/23 Next Visit: 12/31/23 Last Refill: 06/13/23 #30 and 1RF   Please Advise

## 2023-10-07 DIAGNOSIS — H353211 Exudative age-related macular degeneration, right eye, with active choroidal neovascularization: Secondary | ICD-10-CM | POA: Diagnosis not present

## 2023-12-03 ENCOUNTER — Other Ambulatory Visit: Payer: Self-pay | Admitting: *Deleted

## 2023-12-03 DIAGNOSIS — E785 Hyperlipidemia, unspecified: Secondary | ICD-10-CM

## 2023-12-03 MED ORDER — SIMVASTATIN 20 MG PO TABS: 20.0000 mg | ORAL_TABLET | Freq: Every day | ORAL | 0 refills | Status: DC

## 2023-12-04 ENCOUNTER — Telehealth: Payer: Self-pay | Admitting: Family Medicine

## 2023-12-04 DIAGNOSIS — E785 Hyperlipidemia, unspecified: Secondary | ICD-10-CM

## 2023-12-04 DIAGNOSIS — I10 Essential (primary) hypertension: Secondary | ICD-10-CM

## 2023-12-04 NOTE — Telephone Encounter (Unsigned)
 Copied from CRM [id number]. Topic: Clinical - Medication Refill >> Dec 04, 2023 11:38 AM Chiquita SQUIBB wrote: Medication:  olmesartan  (BENICAR ) 40 MG tablet [[id number]]  amLODipine  amLODipine  (NORVASC ) 2.5 MG tablet  triamterene -hydrochlorothiazide triamterene -hydrochlorothiazide (MAXZIDE-25) 37.5-25 MG tablet  metoprolol  succinate metoprolol  succinate (TOPROL -XL) 100 MG 24 hr tablet   simvastatin  simvastatin  (ZOCOR ) 20 MG tablet  Has the patient contacted their pharmacy? Yes (Agent: If no, request that the patient contact the pharmacy for the refill. If patient does not wish to contact the pharmacy document the reason why and proceed with request.) (Agent: If yes, when and what did the pharmacy advise?)  This is the patient's preferred pharmacy:  [id number] - [place], [place] - [address] [facility] [address] [place] [address] Phone: [phone] Fax: [phone]  Is this the correct pharmacy for this prescription? Yes If no, delete pharmacy and type the correct one.   Has the prescription been filled recently? No  Is the patient out of the medication? Yes  Has the patient been seen for an appointment in the last year OR does the patient have an upcoming appointment? Yes  Can we respond through MyChart? No  Agent: Please be advised that Rx refills may take up to 3 business days. We ask that you follow-up with your pharmacy.

## 2023-12-06 DIAGNOSIS — H353211 Exudative age-related macular degeneration, right eye, with active choroidal neovascularization: Secondary | ICD-10-CM | POA: Diagnosis not present

## 2023-12-06 MED ORDER — AMLODIPINE BESYLATE 2.5 MG PO TABS: ORAL_TABLET | ORAL | 1 refills | Status: DC

## 2023-12-06 MED ORDER — TRIAMTERENE-HCTZ 37.5-25 MG PO TABS: 1.0000 | ORAL_TABLET | Freq: Every day | ORAL | 1 refills | Status: DC

## 2023-12-06 MED ORDER — SIMVASTATIN 20 MG PO TABS: 20.0000 mg | ORAL_TABLET | Freq: Every day | ORAL | 0 refills | Status: DC

## 2023-12-06 MED ORDER — OLMESARTAN MEDOXOMIL 40 MG PO TABS: ORAL_TABLET | ORAL | 1 refills | Status: DC

## 2023-12-06 MED ORDER — METOPROLOL SUCCINATE ER 100 MG PO TB24: 100.0000 mg | ORAL_TABLET | Freq: Every day | ORAL | 1 refills | Status: DC

## 2023-12-31 ENCOUNTER — Ambulatory Visit: Payer: Medicare Other | Admitting: Family Medicine

## 2024-01-17 ENCOUNTER — Other Ambulatory Visit: Payer: Self-pay | Admitting: Family Medicine

## 2024-01-17 DIAGNOSIS — F411 Generalized anxiety disorder: Secondary | ICD-10-CM

## 2024-01-17 NOTE — Telephone Encounter (Signed)
 Requesting: diazepam  5mg   Contract: 08/06/19 UDS: None Last Visit: 06/13/23 Next Visit: None Last Refill: 09/26/23 #30 and 1RF   Please Advise

## 2024-02-10 DIAGNOSIS — H353211 Exudative age-related macular degeneration, right eye, with active choroidal neovascularization: Secondary | ICD-10-CM | POA: Diagnosis not present

## 2024-02-11 ENCOUNTER — Ambulatory Visit: Admitting: Family Medicine

## 2024-02-18 ENCOUNTER — Ambulatory Visit: Admitting: Family Medicine

## 2024-02-25 ENCOUNTER — Ambulatory Visit

## 2024-02-25 ENCOUNTER — Ambulatory Visit (INDEPENDENT_AMBULATORY_CARE_PROVIDER_SITE_OTHER): Admitting: Family Medicine

## 2024-02-25 ENCOUNTER — Encounter: Payer: Self-pay | Admitting: Family Medicine

## 2024-02-25 VITALS — BP 128/90 | HR 84 | Temp 98.1°F | Resp 18 | Ht 63.0 in | Wt 144.6 lb

## 2024-02-25 DIAGNOSIS — E1169 Type 2 diabetes mellitus with other specified complication: Secondary | ICD-10-CM

## 2024-02-25 DIAGNOSIS — E118 Type 2 diabetes mellitus with unspecified complications: Secondary | ICD-10-CM

## 2024-02-25 DIAGNOSIS — H353211 Exudative age-related macular degeneration, right eye, with active choroidal neovascularization: Secondary | ICD-10-CM | POA: Diagnosis not present

## 2024-02-25 DIAGNOSIS — Z23 Encounter for immunization: Secondary | ICD-10-CM | POA: Diagnosis not present

## 2024-02-25 DIAGNOSIS — F411 Generalized anxiety disorder: Secondary | ICD-10-CM | POA: Diagnosis not present

## 2024-02-25 DIAGNOSIS — L89152 Pressure ulcer of sacral region, stage 2: Secondary | ICD-10-CM

## 2024-02-25 DIAGNOSIS — I10 Essential (primary) hypertension: Secondary | ICD-10-CM

## 2024-02-25 DIAGNOSIS — E785 Hyperlipidemia, unspecified: Secondary | ICD-10-CM

## 2024-02-25 MED ORDER — METOPROLOL SUCCINATE ER 100 MG PO TB24: 100.0000 mg | ORAL_TABLET | Freq: Every day | ORAL | 1 refills | Status: AC

## 2024-02-25 MED ORDER — TRIAMTERENE-HCTZ 37.5-25 MG PO TABS: 1.0000 | ORAL_TABLET | Freq: Every day | ORAL | 1 refills | Status: DC

## 2024-02-25 MED ORDER — OLMESARTAN MEDOXOMIL 40 MG PO TABS: ORAL_TABLET | ORAL | 1 refills | Status: AC

## 2024-02-25 MED ORDER — DIAZEPAM 5 MG PO TABS: 5.0000 mg | ORAL_TABLET | Freq: Every day | ORAL | 1 refills | Status: AC | PRN

## 2024-02-25 MED ORDER — AMLODIPINE BESYLATE 2.5 MG PO TABS: ORAL_TABLET | ORAL | 1 refills | Status: DC

## 2024-02-25 MED ORDER — SIMVASTATIN 20 MG PO TABS: 20.0000 mg | ORAL_TABLET | Freq: Every day | ORAL | 0 refills | Status: DC

## 2024-02-25 NOTE — Progress Notes (Addendum)
 Subjective:    Patient ID: [name], female    DOB: 1935/02/05, 89 y.o.   MRN: [id number]  Chief Complaint  Patient presents with   Hypertension   Hyperlipidemia   Follow-up    HPI Patient is in today for f/u bp and chol..   Discussed the use of AI scribe software for clinical note transcription with the patient, who gave verbal consent to proceed.  History of Present Illness [name] is an 89 year old female who presents for a routine follow-up and flu shot.  Her kidney function was slightly elevated in February. Since then, she has been increasing her water intake.  Her A1c was last recorded at 6.3, which is the lowest it has been. She is pleased with her current level.  She canceled a wellness visit last week due to Medicare not covering it over the phone and plans to reschedule it for January.  She inquires about her current medications, including triamterene  and Valium . She cuts her Valium  pills in half and takes them as needed, emphasizing infrequent use.  She lives alone since her husband passed away three years ago, but her sons visit her frequently. She has been receiving a lot of company this year.  No falls reported.    Past Medical History:  Diagnosis Date   Abscess, rectum    Diabetes mellitus    type 2   Fatty liver disease, nonalcoholic    Hyperlipidemia    Hypertension    Thyroid  nodule     Past Surgical History:  Procedure Laterality Date   ABDOMINAL HYSTERECTOMY     ANAL FISSURE REPAIR     CATARACT EXTRACTION, BILATERAL  Feb   EYE SURGERY  06/2013   cataract b/l   HEMORRHOID SURGERY     TONSILLECTOMY AND ADENOIDECTOMY      Family History  Problem Relation Age of Onset   Other Brother        Rhabdo secondary to Statin   Diabetes Brother    Hypertension Brother    Diabetes Mother    Hypertension Mother    Hypertension Sister    Mental retardation Sister    Diabetes Unknown    Hypertension Unknown     Social  History   Socioeconomic History   Marital status: Married    Spouse name: Not on file   Number of children: Not on file   Years of education: Not on file   Highest education level: Not on file  Occupational History   Occupation: retired runner, broadcasting/film/video  Tobacco Use   Smoking status: Never   Smokeless tobacco: Never  Substance and Sexual Activity   Alcohol use: No   Drug use: No   Sexual activity: Yes    Partners: Male  Other Topics Concern   Not on file  Social History Narrative   Exercise- walks everyday   Lives with husband   Social Drivers of Corporate Investment Banker Strain: Not on file  Food Insecurity: Not on file  Transportation Needs: Not on file  Physical Activity: Not on file  Stress: Not on file  Social Connections: Not on file  Intimate Partner Violence: Not on file    Outpatient Medications Prior to Visit  Medication Sig Dispense Refill   aspirin 81 MG tablet Take 81 mg by mouth daily.       Calcium-Vitamin D-Vitamin K 500-500-40 MG-UNT-MCG CHEW Chew 2 each by mouth daily.     amLODipine  (NORVASC ) 2.5 MG tablet 1 po every  day 90 tablet 1   diazepam  (VALIUM ) 5 MG tablet TAKE 1 TABLET BY MOUTH EVERY DAY AS NEEDED 30 tablet 1   metoprolol  succinate (TOPROL -XL) 100 MG 24 hr tablet Take 1 tablet (100 mg total) by mouth daily. Take with or immediately following a meal 90 tablet 1   olmesartan  (BENICAR ) 40 MG tablet TAKE 1 TABLET(40 MG) BY MOUTH DAILY 90 tablet 1   simvastatin  (ZOCOR ) 20 MG tablet Take 1 tablet (20 mg total) by mouth daily. 90 tablet 0   triamterene -hydrochlorothiazide (MAXZIDE-25) 37.5-25 MG tablet Take 1 tablet by mouth daily. 90 tablet 1   No facility-administered medications prior to visit.    Allergies  Allergen Reactions   Sulfonamide Derivatives Other (See Comments)    headache    Review of Systems  Constitutional:  Negative for fever and malaise/fatigue.  HENT:  Negative for congestion.   Eyes:  Negative for blurred vision.   Respiratory:  Negative for cough and shortness of breath.   Cardiovascular:  Negative for chest pain, palpitations and leg swelling.  Gastrointestinal:  Negative for abdominal pain, blood in stool, nausea and vomiting.  Genitourinary:  Negative for dysuria and frequency.  Musculoskeletal:  Negative for back pain and falls.  Skin:  Negative for rash.  Neurological:  Negative for dizziness, loss of consciousness and headaches.  Endo/Heme/Allergies:  Negative for environmental allergies.  Psychiatric/Behavioral:  Negative for depression. The patient is not nervous/anxious.        Objective:    Physical Exam Vitals and nursing note reviewed.  Constitutional:      General: She is not in acute distress.    Appearance: Normal appearance. She is well-developed.  HENT:     Head: Normocephalic and atraumatic.     Right Ear: Tympanic membrane, ear canal and external ear normal. There is no impacted cerumen.     Left Ear: Tympanic membrane, ear canal and external ear normal. There is no impacted cerumen.     Nose: Nose normal.     Mouth/Throat:     Mouth: Mucous membranes are moist.     Pharynx: Oropharynx is clear. No oropharyngeal exudate or posterior oropharyngeal erythema.  Eyes:     General: No scleral icterus.       Right eye: No discharge.        Left eye: No discharge.     Conjunctiva/sclera: Conjunctivae normal.     Pupils: Pupils are equal, round, and reactive to light.  Neck:     Thyroid : No thyromegaly or thyroid  tenderness.     Vascular: No JVD.  Cardiovascular:     Rate and Rhythm: Normal rate and regular rhythm.     Heart sounds: Normal heart sounds. No murmur heard. Pulmonary:     Effort: Pulmonary effort is normal. No respiratory distress.     Breath sounds: Normal breath sounds.  Abdominal:     General: Bowel sounds are normal. There is no distension.     Palpations: Abdomen is soft. There is no mass.     Tenderness: There is no abdominal tenderness. There is no  guarding or rebound.  Musculoskeletal:        General: Normal range of motion.     Cervical back: Normal range of motion and neck supple.     Right lower leg: No edema.     Left lower leg: No edema.  Lymphadenopathy:     Cervical: No cervical adenopathy.  Skin:    General: Skin is warm and dry.  Findings: No erythema or rash.  Neurological:     Mental Status: She is alert and oriented to person, place, and time.     Cranial Nerves: No cranial nerve deficit.     Deep Tendon Reflexes: Reflexes are normal and symmetric.  Psychiatric:        Mood and Affect: Mood normal.        Behavior: Behavior normal.        Thought Content: Thought content normal.        Judgment: Judgment normal.     BP (!) 128/90 (BP Location: Right Arm, Patient Position: Sitting, Cuff Size: Normal)   Pulse 84   Temp 98.1 F (36.7 C) (Oral)   Resp 18   Ht 5' 3 (1.6 m)   Wt 144 lb 9.6 oz (65.6 kg)   SpO2 98%   BMI 25.61 kg/m  Wt Readings from Last 3 Encounters:  02/25/24 144 lb 9.6 oz (65.6 kg)  06/13/23 143 lb 12.8 oz (65.2 kg)  11/13/22 148 lb (67.1 kg)    Diabetic Foot Exam - Simple   No data filed    Lab Results  Component Value Date   WBC 5.8 02/25/2024   HGB 14.6 02/25/2024   HCT 44.3 02/25/2024   PLT 221.0 02/25/2024   GLUCOSE 109 (H) 02/25/2024   CHOL 159 02/25/2024   TRIG 92.0 02/25/2024   HDL 67.40 02/25/2024   LDLCALC 73 02/25/2024   ALT 15 02/25/2024   AST 20 02/25/2024   NA 141 02/25/2024   K 4.1 02/25/2024   CL 102 02/25/2024   CREATININE 1.21 (H) 02/25/2024   BUN 27 (H) 02/25/2024   CO2 26 02/25/2024   TSH 1.33 11/13/2022   INR 1.04 06/02/2010   HGBA1C 6.6 (H) 02/25/2024   MICROALBUR 0.4 12/13/2009    Lab Results  Component Value Date   TSH 1.33 11/13/2022   Lab Results  Component Value Date   WBC 5.8 02/25/2024   HGB 14.6 02/25/2024   HCT 44.3 02/25/2024   MCV 83.2 02/25/2024   PLT 221.0 02/25/2024   Lab Results  Component Value Date   NA 141  02/25/2024   K 4.1 02/25/2024   CO2 26 02/25/2024   GLUCOSE 109 (H) 02/25/2024   BUN 27 (H) 02/25/2024   CREATININE 1.21 (H) 02/25/2024   BILITOT 1.0 02/25/2024   ALKPHOS 68 02/25/2024   AST 20 02/25/2024   ALT 15 02/25/2024   PROT 7.2 02/25/2024   ALBUMIN 4.7 02/25/2024   CALCIUM 10.0 02/25/2024   GFR 40.33 (L) 02/25/2024   Lab Results  Component Value Date   CHOL 159 02/25/2024   Lab Results  Component Value Date   HDL 67.40 02/25/2024   Lab Results  Component Value Date   LDLCALC 73 02/25/2024   Lab Results  Component Value Date   TRIG 92.0 02/25/2024   Lab Results  Component Value Date   CHOLHDL 2 02/25/2024   Lab Results  Component Value Date   HGBA1C 6.6 (H) 02/25/2024       Assessment & Plan:  Essential hypertension Assessment & Plan: Well controlled, no changes to meds. Encouraged heart healthy diet such as the DASH diet and exercise as tolerated.    Orders: -     Lipid panel -     CBC with Differential/Platelet -     Comprehensive metabolic panel with GFR -     amLODIPine  Besylate; 1 po every day  Dispense: 90 tablet; Refill: 1 -  Metoprolol  Succinate ER; Take 1 tablet (100 mg total) by mouth daily. Take with or immediately following a meal  Dispense: 90 tablet; Refill: 1 -     Olmesartan  Medoxomil; TAKE 1 TABLET(40 MG) BY MOUTH DAILY  Dispense: 90 tablet; Refill: 1  Need for influenza vaccination -     Flu vaccine HIGH DOSE PF(Fluzone Trivalent)  Hyperlipidemia, unspecified hyperlipidemia type -     Lipid panel -     Comprehensive metabolic panel with GFR -     Simvastatin ; Take 1 tablet (20 mg total) by mouth daily.  Dispense: 90 tablet; Refill: 0  Type 2 diabetes mellitus with complication, without long-term current use of insulin (HCC) Assessment & Plan: hgba1c to be checked, minimize simple carbs. Increase exercise as tolerated. Continue current meds   Orders: -     CBC with Differential/Platelet -     Comprehensive metabolic panel  with GFR -     Hemoglobin A1c -     Microalbumin / creatinine urine ratio  Generalized anxiety disorder -     diazePAM ; Take 1 tablet (5 mg total) by mouth daily as needed.  Dispense: 30 tablet; Refill: 1  Primary hypertension -     Triamterene -HCTZ; Take 1 tablet by mouth daily.  Dispense: 90 tablet; Refill: 1  Hyperlipidemia associated with type 2 diabetes mellitus (HCC) Assessment & Plan: Encourage heart healthy diet such as MIND or DASH diet, increase exercise, avoid trans fats, simple carbohydrates and processed foods, consider a krill or fish or flaxseed oil cap daily.     Pressure injury of sacral region, stage 2 (HCC)  Exudative age-related macular degeneration, right eye, with active choroidal neovascularization (HCC)  Assessment and Plan Assessment & Plan Type 2 Diabetes Mellitus   Type 2 Diabetes Mellitus is well-controlled with an A1c of 6.3, the lowest it has been for her. This is within the acceptable range for her age, as we aim to keep it below 7.  Hypertension   Hypertension is managed with current medications. Discussed triamterene  prescription, a diuretic for fluid retention and blood pressure management.  Edema   Edema is effectively managed with triamterene , as there is no ankle swelling during the examination.  Immunization   She is due for her flu shot, which will be administered during this visit.    Clytee Heinrich R [name] Chase, DO

## 2024-02-25 NOTE — Patient Instructions (Addendum)

## 2024-02-25 NOTE — Assessment & Plan Note (Signed)
 Encourage heart healthy diet such as MIND or DASH diet, increase exercise, avoid trans fats, simple carbohydrates and processed foods, consider a krill or fish or flaxseed oil cap daily.

## 2024-02-25 NOTE — Assessment & Plan Note (Addendum)
 hgba1c to be checked, minimize simple carbs. Increase exercise as tolerated. Continue current meds With mac degeneration

## 2024-02-25 NOTE — Assessment & Plan Note (Signed)
 Well controlled, no changes to meds. Encouraged heart healthy diet such as the DASH diet and exercise as tolerated.

## 2024-02-26 LAB — CBC WITH DIFFERENTIAL/PLATELET
Basophils Absolute: 0.1 K/uL (ref 0.0–0.1)
Basophils Relative: 1.1 % (ref 0.0–3.0)
Eosinophils Absolute: 0 K/uL (ref 0.0–0.7)
Eosinophils Relative: 0.8 % (ref 0.0–5.0)
HCT: 44.3 % (ref 36.0–46.0)
Hemoglobin: 14.6 g/dL (ref 12.0–15.0)
Lymphocytes Relative: 24.5 % (ref 12.0–46.0)
Lymphs Abs: 1.4 K/uL (ref 0.7–4.0)
MCHC: 32.9 g/dL (ref 30.0–36.0)
MCV: 83.2 fl (ref 78.0–100.0)
Monocytes Absolute: 0.4 K/uL (ref 0.1–1.0)
Monocytes Relative: 7.5 % (ref 3.0–12.0)
Neutro Abs: 3.8 K/uL (ref 1.4–7.7)
Neutrophils Relative %: 66.1 % (ref 43.0–77.0)
Platelets: 221 K/uL (ref 150.0–400.0)
RBC: 5.32 Mil/uL — ABNORMAL HIGH (ref 3.87–5.11)
RDW: 13.7 % (ref 11.5–15.5)
WBC: 5.8 K/uL (ref 4.0–10.5)

## 2024-02-26 LAB — COMPREHENSIVE METABOLIC PANEL WITH GFR
ALT: 15 U/L (ref 0–35)
AST: 20 U/L (ref 0–37)
Albumin: 4.7 g/dL (ref 3.5–5.2)
Alkaline Phosphatase: 68 U/L (ref 39–117)
BUN: 27 mg/dL — ABNORMAL HIGH (ref 6–23)
CO2: 26 meq/L (ref 19–32)
Calcium: 10 mg/dL (ref 8.4–10.5)
Chloride: 102 meq/L (ref 96–112)
Creatinine, Ser: 1.21 mg/dL — ABNORMAL HIGH (ref 0.40–1.20)
GFR: 40.33 mL/min — ABNORMAL LOW (ref >60.00)
Glucose, Bld: 109 mg/dL — ABNORMAL HIGH (ref 70–99)
Potassium: 4.1 meq/L (ref 3.5–5.1)
Sodium: 141 meq/L (ref 135–145)
Total Bilirubin: 1 mg/dL (ref 0.2–1.2)
Total Protein: 7.2 g/dL (ref 6.0–8.3)

## 2024-02-26 LAB — LIPID PANEL
Cholesterol: 159 mg/dL (ref 0–200)
HDL: 67.4 mg/dL (ref >39.00)
LDL Cholesterol: 73 mg/dL (ref 0–99)
NonHDL: 91.66
Total CHOL/HDL Ratio: 2
Triglycerides: 92 mg/dL (ref 0.0–149.0)
VLDL: 18.4 mg/dL (ref 0.0–40.0)

## 2024-02-26 LAB — HEMOGLOBIN A1C: Hgb A1c MFr Bld: 6.6 % — ABNORMAL HIGH (ref 4.6–6.5)

## 2024-03-01 ENCOUNTER — Ambulatory Visit: Payer: Self-pay | Admitting: Family Medicine

## 2024-03-04 DIAGNOSIS — H353211 Exudative age-related macular degeneration, right eye, with active choroidal neovascularization: Secondary | ICD-10-CM | POA: Insufficient documentation

## 2024-03-04 DIAGNOSIS — L89152 Pressure ulcer of sacral region, stage 2: Secondary | ICD-10-CM | POA: Insufficient documentation

## 2024-03-17 NOTE — Telephone Encounter (Unsigned)
 Copied from CRM [id number]. Topic: Clinical - Lab/Test Results >> Mar 17, 2024  2:27 PM Viola F wrote: Reason for CRM: Patient called to follow up on lab results from 02/25/24 - I let her know that letter was mailed 03/02/24, she says she never received it. I relayed the lab results note to patient and she had no further questions.

## 2024-04-20 DIAGNOSIS — H353211 Exudative age-related macular degeneration, right eye, with active choroidal neovascularization: Secondary | ICD-10-CM | POA: Diagnosis not present

## 2024-05-14 ENCOUNTER — Other Ambulatory Visit: Payer: Self-pay | Admitting: Family Medicine

## 2024-05-14 DIAGNOSIS — E785 Hyperlipidemia, unspecified: Secondary | ICD-10-CM

## 2024-05-26 ENCOUNTER — Other Ambulatory Visit: Payer: Self-pay | Admitting: Family Medicine

## 2024-05-26 DIAGNOSIS — I10 Essential (primary) hypertension: Secondary | ICD-10-CM

## 2024-09-11 ENCOUNTER — Ambulatory Visit: Admitting: Family Medicine
